# Patient Record
Sex: Female | Born: 1940 | Race: White | Hispanic: No | Smoking: Never smoker
Health system: Southern US, Community
[De-identification: ages and names within clinical notes are randomized; demographics above are authoritative.]

## PROBLEM LIST (undated history)

## (undated) DIAGNOSIS — F0280 Dementia in other diseases classified elsewhere without behavioral disturbance: Secondary | ICD-10-CM

## (undated) DIAGNOSIS — F32A Depression, unspecified: Secondary | ICD-10-CM

## (undated) DIAGNOSIS — F329 Major depressive disorder, single episode, unspecified: Secondary | ICD-10-CM

## (undated) DIAGNOSIS — I10 Essential (primary) hypertension: Secondary | ICD-10-CM

## (undated) HISTORY — PX: BREAST ENHANCEMENT SURGERY: SHX7

## (undated) HISTORY — PX: TUBAL LIGATION: SHX77

## (undated) HISTORY — DX: Depression, unspecified: F32.A

## (undated) HISTORY — PX: ANKLE SURGERY: SHX546

## (undated) HISTORY — DX: Dementia in other diseases classified elsewhere, unspecified severity, without behavioral disturbance, psychotic disturbance, mood disturbance, and anxiety: F02.80

## (undated) HISTORY — DX: Major depressive disorder, single episode, unspecified: F32.9

## (undated) HISTORY — PX: TONSILLECTOMY AND ADENOIDECTOMY: SUR1326

---

## 1982-08-22 HISTORY — PX: BREAST BIOPSY: SHX20

## 2004-08-22 HISTORY — PX: AUGMENTATION MAMMAPLASTY: SUR837

## 2005-07-26 DIAGNOSIS — F39 Unspecified mood [affective] disorder: Secondary | ICD-10-CM | POA: Insufficient documentation

## 2005-07-26 DIAGNOSIS — I10 Essential (primary) hypertension: Secondary | ICD-10-CM | POA: Insufficient documentation

## 2009-08-22 HISTORY — PX: HEMORRHOID SURGERY: SHX153

## 2010-12-23 ENCOUNTER — Ambulatory Visit: Payer: Self-pay | Admitting: Family Medicine

## 2011-03-07 ENCOUNTER — Ambulatory Visit: Payer: Self-pay | Admitting: Family Medicine

## 2011-09-30 DIAGNOSIS — I341 Nonrheumatic mitral (valve) prolapse: Secondary | ICD-10-CM | POA: Insufficient documentation

## 2011-09-30 DIAGNOSIS — F329 Major depressive disorder, single episode, unspecified: Secondary | ICD-10-CM | POA: Insufficient documentation

## 2012-06-13 ENCOUNTER — Ambulatory Visit: Payer: Self-pay | Admitting: Family Medicine

## 2013-02-15 ENCOUNTER — Ambulatory Visit: Payer: Self-pay | Admitting: Unknown Physician Specialty

## 2013-07-03 DIAGNOSIS — M75100 Unspecified rotator cuff tear or rupture of unspecified shoulder, not specified as traumatic: Secondary | ICD-10-CM | POA: Insufficient documentation

## 2013-07-08 ENCOUNTER — Ambulatory Visit: Payer: Self-pay | Admitting: Family Medicine

## 2014-04-30 ENCOUNTER — Ambulatory Visit: Payer: Self-pay | Admitting: Family Medicine

## 2014-11-13 DIAGNOSIS — I341 Nonrheumatic mitral (valve) prolapse: Secondary | ICD-10-CM | POA: Diagnosis not present

## 2014-11-13 DIAGNOSIS — R55 Syncope and collapse: Secondary | ICD-10-CM | POA: Diagnosis not present

## 2014-11-13 DIAGNOSIS — R002 Palpitations: Secondary | ICD-10-CM | POA: Diagnosis not present

## 2014-11-13 DIAGNOSIS — I351 Nonrheumatic aortic (valve) insufficiency: Secondary | ICD-10-CM | POA: Diagnosis not present

## 2015-03-19 ENCOUNTER — Ambulatory Visit: Payer: Self-pay | Admitting: Family Medicine

## 2015-05-12 ENCOUNTER — Encounter: Payer: Self-pay | Admitting: Family Medicine

## 2015-05-28 ENCOUNTER — Encounter: Payer: Self-pay | Admitting: Family Medicine

## 2015-05-28 ENCOUNTER — Ambulatory Visit (INDEPENDENT_AMBULATORY_CARE_PROVIDER_SITE_OTHER): Payer: Medicare Other | Admitting: Family Medicine

## 2015-05-28 VITALS — BP 120/68 | HR 58 | Temp 97.8°F | Resp 16 | Ht 63.0 in | Wt 150.0 lb

## 2015-05-28 DIAGNOSIS — D709 Neutropenia, unspecified: Secondary | ICD-10-CM | POA: Insufficient documentation

## 2015-05-28 DIAGNOSIS — F4321 Adjustment disorder with depressed mood: Secondary | ICD-10-CM | POA: Diagnosis not present

## 2015-05-28 DIAGNOSIS — E785 Hyperlipidemia, unspecified: Secondary | ICD-10-CM | POA: Diagnosis not present

## 2015-05-28 DIAGNOSIS — R197 Diarrhea, unspecified: Secondary | ICD-10-CM | POA: Insufficient documentation

## 2015-05-28 DIAGNOSIS — I10 Essential (primary) hypertension: Secondary | ICD-10-CM

## 2015-05-28 DIAGNOSIS — G2581 Restless legs syndrome: Secondary | ICD-10-CM | POA: Insufficient documentation

## 2015-05-28 DIAGNOSIS — M810 Age-related osteoporosis without current pathological fracture: Secondary | ICD-10-CM | POA: Insufficient documentation

## 2015-05-28 DIAGNOSIS — Z1239 Encounter for other screening for malignant neoplasm of breast: Secondary | ICD-10-CM | POA: Diagnosis not present

## 2015-05-28 DIAGNOSIS — G4700 Insomnia, unspecified: Secondary | ICD-10-CM | POA: Insufficient documentation

## 2015-05-28 DIAGNOSIS — I351 Nonrheumatic aortic (valve) insufficiency: Secondary | ICD-10-CM | POA: Insufficient documentation

## 2015-05-28 DIAGNOSIS — Z0000 Encounter for general adult medical examination without abnormal findings: Secondary | ICD-10-CM

## 2015-05-28 DIAGNOSIS — M1990 Unspecified osteoarthritis, unspecified site: Secondary | ICD-10-CM | POA: Insufficient documentation

## 2015-05-28 MED ORDER — ALPRAZOLAM 0.5 MG PO TABS: 0.5000 mg | ORAL_TABLET | Freq: Every evening | ORAL | Status: DC | PRN

## 2015-05-28 NOTE — Patient Instructions (Signed)
Patient advised to continue weight bearing exercise. Patient advised to check bone density in 2 years.   Please call the [place] at [facility] to schedule this at [phone].  I recommend that you get a flu vaccine this year. Please call our office at [phone] at your earliest convenience to schedule a flu shot.

## 2015-05-28 NOTE — Progress Notes (Signed)
Patient ID: Elaine [name], female   DOB: 11/30/1940, 74 y.o.   MRN: [id number]        Patient: Elaine [name], Female    DOB: [birth year], 74 y.o.   MRN: [id number] Visit Date: 05/28/2015  Today's Provider: Margarita Rana, MD   Chief Complaint  Patient presents with  . Medicare Wellness   Subjective:    Annual wellness visit  Elaine [name] is a 74 y.o. female. She feels well. She reports exercising 3 times a week. She reports she is sleeping fairly well.  04/16/14 CPE 10/09/09 Pap-neg; HPV-Neg 07/08/13 Mammogram-BI-RADS 1 02/15/13 Colonoscopy-Diverticulosis, int hemorrhoids 04/30/14 BMD-Osteoporosis  -----------------------------------------------------------   Review of Systems  Constitutional: Negative.   HENT: Positive for hearing loss and tinnitus.   Eyes: Negative.   Respiratory: Negative.   Cardiovascular: Negative.   Gastrointestinal: Positive for diarrhea.  Endocrine: Negative.   Genitourinary: Negative.   Musculoskeletal: Positive for myalgias and arthralgias.  Skin: Negative.   Allergic/Immunologic: Negative.   Neurological: Negative.   Hematological: Negative.   Psychiatric/Behavioral: Negative.     Social History   Social History  . Marital Status: Widowed    Spouse Name: N/A  . Number of Children: 2  . Years of Education: H/S   Occupational History  . Retired     Psychologist, educational, Retail buyer   Social History Main Topics  . Smoking status: Never Smoker   . Smokeless tobacco: Never Used  . Alcohol Use: 0.6 oz/week    1 Shots of liquor per week     Comment: Pt has two drinks a day on average.  . Drug Use: No  . Sexual Activity: Not on file   Other Topics Concern  . Not on file   Social History Narrative    Patient Active Problem List   Diagnosis Date Noted  . 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 Cannot sleep 05/28/2015  . Neutropenia ([place]) 05/28/2015  . Arthritis, degenerative 05/28/2015  . Osteoporosis,  post-menopausal 05/28/2015  . 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 Restless leg 05/28/2015  . Heart valve disease 05/28/2015  . Rotator cuff syndrome 07/03/2013  . 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 Acid reflux 06/08/2007  . HLD (hyperlipidemia) 01/12/2007  . Adjustment disorder with depressed mood 07/26/2005  . BP (high blood pressure) 07/26/2005  . 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 Tubal ligation    . Breast biopsy    . Hemorrhoid surgery  2011    Hemorrhoidectomy  . Ankle surgery    . Breast enhancement surgery Bilateral   . Tonsillectomy and adenoidectomy      Her family history includes Breast cancer in her maternal aunt; Heart attack in her father; Hypertension in her sister.    Previous Medications   ENALAPRIL (VASOTEC) 5 MG TABLET    Take 5 mg by mouth daily.    IBUPROFEN 200 MG CAPS    Take 1 capsule by mouth as needed.    SERTRALINE (ZOLOFT) 50 MG TABLET    Take 50 mg by mouth daily.     Patient Care Team: Margarita Rana, MD as PCP - General (Family Medicine)     Objective:   Vitals: BP 120/68 mmHg  Pulse 58  Temp(Src) 97.8 F (36.6 C) (Oral)  Resp 16  Ht 5\' 3"  (1.6 m)  Wt 150 lb (68.04 kg)  BMI 26.58 kg/m2  SpO2 97%  Physical Exam  Constitutional: She is oriented to person, place, and time. She appears well-developed and well-nourished.  HENT:  Head: Normocephalic and atraumatic.  Right Ear: Tympanic membrane, external ear and ear canal normal.  Left Ear: Tympanic membrane, external ear and ear canal normal.  Nose: Nose normal.  Mouth/Throat: Uvula is midline, oropharynx is clear and moist and mucous membranes are normal.  Eyes: Conjunctivae, EOM and lids are normal. Pupils are equal, round, and reactive to light.  Neck: Trachea normal and normal range of motion. Neck supple. Carotid bruit is not present. No thyroid mass and no thyromegaly present.    Cardiovascular: Normal rate and regular rhythm.   Murmur heard. Pulmonary/Chest: Effort normal and breath sounds normal.  Abdominal: Soft. Normal appearance and bowel sounds are normal. There is no hepatosplenomegaly. There is no tenderness.  Genitourinary: No breast swelling, tenderness or discharge.  Musculoskeletal: Normal range of motion.  Lymphadenopathy:    She has no cervical adenopathy.    She has no axillary adenopathy.  Neurological: She is alert and oriented to person, place, and time. She has normal strength. No cranial nerve deficit.  Skin: Skin is warm, dry and intact.  Psychiatric: She has a normal mood and affect. Her speech is normal and behavior is normal. Judgment and thought content normal. Cognition and memory are normal.    Activities of Daily Living In your present state of health, do you have any difficulty performing the following activities: 05/28/2015  Hearing? Y  Vision? Y  Difficulty concentrating or making decisions? Y  Walking or climbing stairs? N  Dressing or bathing? N  Doing errands, shopping? N    Fall Risk Assessment Fall Risk  05/28/2015  Falls in the past year? No     Depression Screen PHQ 2/9 Scores 05/28/2015  PHQ - 2 Score 0    Cognitive Testing - 6-CIT  Correct? Score   What year is it? yes 0 0 or 4  What month is it? yes 0 0 or 3  Memorize:    Elaine [name],  41,  [address]  [place],      What time is it? (within 1 hour) yes 0 0 or 3  Count backwards from 20 yes 0 0, 2, or 4  Name the months of the year yes 0 0, 2, or 4  Repeat name & address above yes 0 0, 2, 4, 6, 8, or 10       TOTAL SCORE  0/28   Interpretation:  Normal  Normal (0-7) Abnormal (8-28)       Assessment & Plan:     Annual Wellness Visit  Reviewed patient's Family Medical History Reviewed and updated list of patient's medical providers Assessment of cognitive impairment was done Assessed patient's functional ability Established a written schedule  for health screening [place] Completed and Reviewed  Exercise Activities and Dietary recommendations Goals    . Exercise 150 minutes per week (moderate activity)       Immunization History  Administered Date(s) Administered  . Pneumococcal Conjugate-13 05/28/2014  . Zoster 11/03/2010    Health Maintenance  Topic Date Due  . Samul Dada  03/08/1959  . COLONOSCOPY  03/07/1990  . ZOSTAVAX  03/07/2000  . DEXA SCAN  03/07/2005  . PNA vac Low Risk Adult (1 of 2 - PCV13) 03/07/2005  . INFLUENZA VACCINE  03/23/2015      1. Medicare annual wellness visit, subsequent Continue to eat healthy and exercise. Recheck bone density next year. Decline medication  at this time.    2. Breast cancer screening Will call for appointment.  - MM DIGITAL SCREENING BILATERAL; Future  3. Essential hypertension Stable.  - Comprehensive metabolic panel - TSH  4. HLD (hyperlipidemia) Check  Labs.  - CBC with Differential/Platelet - Lipid Panel With LDL/HDL Ratio  5. Adjustment disorder with depressed mood Refill to use as needed.  - ALPRAZolam (XANAX) 0.5 MG tablet; Take 1 tablet (0.5 mg total) by mouth at bedtime as needed for anxiety.  Dispense: 30 tablet; Refill: 3     Patient was seen and examined by Jerrell Belfast, MD, and note scribed by Lynford Humphrey, [place].   I have reviewed the document for accuracy and completeness and I agree with above. Jerrell Belfast, MD   Margarita Rana, MD  ------------------------------------------------------------------------------------------------------------

## 2015-06-05 ENCOUNTER — Telehealth: Payer: Self-pay

## 2015-06-05 LAB — CBC WITH DIFFERENTIAL/PLATELET
BASOS ABS: 0 10*3/uL (ref 0.0–0.2)
BASOS: 0 %
EOS (ABSOLUTE): 0.1 10*3/uL (ref 0.0–0.4)
Eos: 2 %
HEMOGLOBIN: 13.8 g/dL (ref 11.1–15.9)
Hematocrit: 41.5 % (ref 34.0–46.6)
IMMATURE GRANS (ABS): 0 10*3/uL (ref 0.0–0.1)
Immature Granulocytes: 0 %
LYMPHS: 24 %
Lymphocytes Absolute: 1.1 10*3/uL (ref 0.7–3.1)
MCH: 31.4 pg (ref 26.6–33.0)
MCHC: 33.3 g/dL (ref 31.5–35.7)
MCV: 95 fL (ref 79–97)
MONOCYTES: 6 %
Monocytes Absolute: 0.3 10*3/uL (ref 0.1–0.9)
NEUTROS ABS: 3 10*3/uL (ref 1.4–7.0)
Neutrophils: 68 %
Platelets: 159 10*3/uL (ref 150–379)
RBC: 4.39 x10E6/uL (ref 3.77–5.28)
RDW: 13.3 % (ref 12.3–15.4)
WBC: 4.5 10*3/uL (ref 3.4–10.8)

## 2015-06-05 LAB — COMPREHENSIVE METABOLIC PANEL
ALBUMIN: 4.4 g/dL (ref 3.5–4.8)
ALT: 13 IU/L (ref 0–32)
AST: 21 IU/L (ref 0–40)
Albumin/Globulin Ratio: 2 (ref 1.1–2.5)
Alkaline Phosphatase: 84 IU/L (ref 39–117)
BILIRUBIN TOTAL: 0.6 mg/dL (ref 0.0–1.2)
BUN / CREAT RATIO: 21 (ref 11–26)
BUN: 15 mg/dL (ref 8–27)
CHLORIDE: 104 mmol/L (ref 97–108)
CO2: 24 mmol/L (ref 18–29)
CREATININE: 0.72 mg/dL (ref 0.57–1.00)
Calcium: 9.1 mg/dL (ref 8.7–10.3)
GFR, EST AFRICAN AMERICAN: 95 mL/min/{1.73_m2} (ref >59)
GFR, EST NON AFRICAN AMERICAN: 82 mL/min/{1.73_m2} (ref >59)
GLUCOSE: 104 mg/dL — AB (ref 65–99)
Globulin, Total: 2.2 g/dL (ref 1.5–4.5)
Potassium: 4.3 mmol/L (ref 3.5–5.2)
Sodium: 140 mmol/L (ref 134–144)
TOTAL PROTEIN: 6.6 g/dL (ref 6.0–8.5)

## 2015-06-05 LAB — LIPID PANEL WITH LDL/HDL RATIO
Cholesterol, Total: 191 mg/dL (ref 100–199)
HDL: 38 mg/dL — ABNORMAL LOW (ref >39)
LDL CALC: 141 mg/dL — AB (ref 0–99)
LDL/HDL RATIO: 3.7 ratio — AB (ref 0.0–3.2)
Triglycerides: 59 mg/dL (ref 0–149)
VLDL CHOLESTEROL CAL: 12 mg/dL (ref 5–40)

## 2015-06-05 LAB — TSH: TSH: 0.518 u[IU]/mL (ref 0.450–4.500)

## 2015-06-05 NOTE — Telephone Encounter (Signed)
Pt advised;  She does not want to start a cholesterol medicine at this time.    Thanks,   -Mickel Baas

## 2015-06-05 NOTE — Telephone Encounter (Signed)
-----   Message from Margarita Rana, MD sent at 06/05/2015  1:54 PM EDT ----- Labs stable except for mildly elevated blood sugar. Need to recheck in 3 months for stability. Also cholesterol is mildly elevated, with LDL of 141 and HDL slightly low. In spite of normal total cholesterol,  10 year risk of heart disease is 14 percent and statin is recommended. Please see if patient wants to start medication.  Thanks.

## 2015-06-25 ENCOUNTER — Other Ambulatory Visit: Payer: Self-pay | Admitting: Family Medicine

## 2015-06-25 DIAGNOSIS — F4321 Adjustment disorder with depressed mood: Secondary | ICD-10-CM

## 2015-06-25 NOTE — Telephone Encounter (Signed)
Printed, please fax or call in to pharmacy. Thank you.   

## 2015-06-29 ENCOUNTER — Other Ambulatory Visit: Payer: Self-pay | Admitting: Family Medicine

## 2015-06-29 DIAGNOSIS — I10 Essential (primary) hypertension: Secondary | ICD-10-CM

## 2015-08-20 ENCOUNTER — Telehealth: Payer: Self-pay | Admitting: Family Medicine

## 2015-08-20 NOTE — Telephone Encounter (Signed)
Pt is requesting a flu and pneumonia shot.  Can I schedule this appointment?  CB#[phone]/MW

## 2015-08-21 NOTE — Telephone Encounter (Signed)
PVC 13 given on 05/28/14. sd

## 2015-08-28 ENCOUNTER — Ambulatory Visit (INDEPENDENT_AMBULATORY_CARE_PROVIDER_SITE_OTHER): Payer: Medicare Other | Admitting: Family Medicine

## 2015-08-28 DIAGNOSIS — Z23 Encounter for immunization: Secondary | ICD-10-CM

## 2015-08-28 NOTE — Addendum Note (Signed)
Addended by: Silvio Clayman on: 08/28/2015 10:01 AM   Modules accepted: Orders

## 2015-09-21 ENCOUNTER — Ambulatory Visit (INDEPENDENT_AMBULATORY_CARE_PROVIDER_SITE_OTHER): Payer: Medicare Other | Admitting: Family Medicine

## 2015-09-21 ENCOUNTER — Encounter: Payer: Self-pay | Admitting: Family Medicine

## 2015-09-21 VITALS — BP 126/60 | HR 60 | Temp 97.8°F | Resp 16 | Ht 63.0 in | Wt 150.0 lb

## 2015-09-21 DIAGNOSIS — R7309 Other abnormal glucose: Secondary | ICD-10-CM | POA: Diagnosis not present

## 2015-09-21 DIAGNOSIS — G4700 Insomnia, unspecified: Secondary | ICD-10-CM | POA: Diagnosis not present

## 2015-09-21 DIAGNOSIS — E785 Hyperlipidemia, unspecified: Secondary | ICD-10-CM

## 2015-09-21 LAB — POCT GLYCOSYLATED HEMOGLOBIN (HGB A1C)
ESTIMATED AVERAGE GLUCOSE: 103
Hemoglobin A1C: 5.2

## 2015-09-21 NOTE — Progress Notes (Signed)
Patient ID: Elaine [name], female   DOB: Jun 03, 1941, 75 y.o.   MRN: [id number]       Patient: Elaine [name] Female    DOB: September 17, 1940   75 y.o.   MRN: [id number] Visit Date: 09/21/2015  Today's Provider: Margarita Rana, MD   Chief Complaint  Patient presents with  . Hyperglycemia   Subjective:    HPI  Prediabetes, Follow-up:   Lab Results  Component Value Date   HGBA1C 5.2 09/21/2015   GLUCOSE 104* 06/04/2015    Last seen for for this3 months ago.  Management since that visit includes labs checked. Patient advised to work on healthy lifestyle. Current symptoms include none and have been stable.  Weight trend: stable Prior visit with dietician: no Current diet: in general, a "healthy" diet   Current exercise: cardiovascular workout on exercise equipment and weightlifting 3 days a week.  Pertinent Labs:    Component Value Date/Time   CHOL 191 06/04/2015 1033   TRIG 59 06/04/2015 1033   CREATININE 0.72 06/04/2015 1033    Wt Readings from Last 3 Encounters:  09/21/15 150 lb (68.04 kg)  05/28/15 150 lb (68.04 kg)  05/28/14 145 lb (65.772 kg)         Lipid/Cholesterol, Follow-up:   Last seen for this3 months ago.  Management changes since that visit include check labs. Patient advised to continue working on healthy lifestyle . Last Lipid Panel:    Component Value Date/Time   CHOL 191 06/04/2015 1033   TRIG 59 06/04/2015 1033   HDL 38* 06/04/2015 1033   LDLCALC 141* 06/04/2015 1033    Risk factors for vascular disease include hypertension  Weight trend: stable Prior visit with dietician: no Current diet: in general, a "healthy" diet   Current exercise: cardiovascular workout on exercise equipment and weightlifting  Wt Readings from Last 3 Encounters:  09/21/15 150 lb (68.04 kg)  05/28/15 150 lb (68.04 kg)  05/28/14 145 lb (65.772 kg)   Xanax is really helping her insomnia.   Takes only as needed. Does not need it nightly.     -------------------------------------------------------------------    No Known Allergies Previous Medications   ALPRAZOLAM (XANAX) 0.5 MG TABLET    TAKE HALF TO 1 TABLET EVERY EVENING AS NEEDED   ENALAPRIL (VASOTEC) 5 MG TABLET    Take 1 tablet by mouth  every day   IBUPROFEN 200 MG CAPS    Take 1 capsule by mouth as needed.    SERTRALINE (ZOLOFT) 50 MG TABLET    Take 50 mg by mouth daily. 1/2 tablet daily    Review of Systems  Constitutional: Negative.   Endocrine: Negative.     Social History  Substance Use Topics  . Smoking status: Never Smoker   . Smokeless tobacco: Never Used  . Alcohol Use: 0.6 oz/week    1 Shots of liquor per week     Comment: Pt has two drinks a day on average.   Objective:   BP 126/60 mmHg  Pulse 60  Temp(Src) 97.8 F (36.6 C) (Oral)  Resp 16  Ht 5\' 3"  (1.6 m)  Wt 150 lb (68.04 kg)  BMI 26.58 kg/m2  SpO2 98%  Physical Exam  Constitutional: She is oriented to person, place, and time. She appears well-developed and well-nourished.  Cardiovascular: Normal rate and regular rhythm.   Pulmonary/Chest: Effort normal and breath sounds normal.  Neurological: She is alert and oriented to person, place, and time.  Psychiatric: She has a normal mood and  affect. Her behavior is normal. Judgment and thought content normal.      Assessment & Plan:     1. Abnormal blood sugar Stable. Patient advised to continue working on healthy lifestyle changes. - POCT glycosylated hemoglobin (Hb A1C) Results for orders placed or performed in visit on 09/21/15  POCT glycosylated hemoglobin (Hb A1C)  Result Value Ref Range   Hemoglobin A1C 5.2    Est. average glucose Bld gHb Est-mCnc 103     2. HLD (hyperlipidemia) As above.   3. Insomnia Xanax working well taking it PRN. Will continue.     Patient was seen and examined by Jerrell Belfast, MD, and note scribed by Lynford Humphrey, [place].   I have reviewed the document for accuracy and completeness and I agree  with above. - Jerrell Belfast, MD   Margarita Rana, MD  [facility]

## 2015-09-24 ENCOUNTER — Other Ambulatory Visit: Payer: Self-pay | Admitting: Family Medicine

## 2015-09-24 ENCOUNTER — Ambulatory Visit
Admission: RE | Admit: 2015-09-24 | Discharge: 2015-09-24 | Disposition: A | Discharge status: Home / Self Care | Payer: Medicare Other | Source: Ambulatory Visit | Attending: Family Medicine | Admitting: Family Medicine

## 2015-09-24 DIAGNOSIS — Z1239 Encounter for other screening for malignant neoplasm of breast: Secondary | ICD-10-CM

## 2015-09-24 DIAGNOSIS — Z1231 Encounter for screening mammogram for malignant neoplasm of breast: Secondary | ICD-10-CM | POA: Diagnosis not present

## 2015-11-12 DIAGNOSIS — I351 Nonrheumatic aortic (valve) insufficiency: Secondary | ICD-10-CM | POA: Diagnosis not present

## 2015-11-12 DIAGNOSIS — I341 Nonrheumatic mitral (valve) prolapse: Secondary | ICD-10-CM | POA: Diagnosis not present

## 2015-12-09 ENCOUNTER — Encounter: Payer: Self-pay | Admitting: Family Medicine

## 2015-12-09 ENCOUNTER — Ambulatory Visit (INDEPENDENT_AMBULATORY_CARE_PROVIDER_SITE_OTHER): Payer: Medicare Other | Admitting: Family Medicine

## 2015-12-09 VITALS — BP 124/64 | HR 64 | Temp 98.1°F | Resp 16 | Ht 63.0 in | Wt 150.0 lb

## 2015-12-09 DIAGNOSIS — E785 Hyperlipidemia, unspecified: Secondary | ICD-10-CM | POA: Diagnosis not present

## 2015-12-09 DIAGNOSIS — I10 Essential (primary) hypertension: Secondary | ICD-10-CM

## 2015-12-09 DIAGNOSIS — R7309 Other abnormal glucose: Secondary | ICD-10-CM

## 2015-12-09 NOTE — Progress Notes (Signed)
Subjective:    Patient ID: Elaine [name], female    DOB: Apr 24, 1941, 75 y.o.   MRN: [id number]  HPI  Hyperlipidemia: Patient presents with hyperlipidemia.  She was tested because provider was checking routine labs.  Her last labs on 06/05/2015 showed Total cholesterol of 191, HDL 38, LDL 141,  Triglycerides 59. negative. There is a family history of early ischemia heart disease. Pt reports her cardiologist "strongly recommends" that pt start medication for high LDL.    Review of Systems  Constitutional: Negative for fever, chills, diaphoresis, activity change, appetite change, fatigue and unexpected weight change.  Cardiovascular: Negative for chest pain, palpitations and leg swelling.   BP 124/64 mmHg  Pulse 64  Temp(Src) 98.1 F (36.7 C) (Oral)  Resp 16  Ht 5\' 3"  (1.6 m)  Wt 150 lb (68.04 kg)  BMI 26.58 kg/m2   Patient Active Problem List   Diagnosis Date Noted  . Insomnia 09/21/2015  . 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 Cannot sleep 05/28/2015  . Neutropenia ([place]) 05/28/2015  . Arthritis, degenerative 05/28/2015  . 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 Restless leg 05/28/2015  . Heart valve disease 05/28/2015  . Rotator cuff syndrome 07/03/2013  . 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 Acid reflux 06/08/2007  . HLD (hyperlipidemia) 01/12/2007  . Adjustment disorder with depressed mood 07/26/2005  . BP (high blood pressure) 07/26/2005  . 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 Depression    Current Outpatient Prescriptions on File Prior to Visit  Medication Sig  . ALPRAZolam (XANAX) 0.5 MG tablet TAKE HALF TO 1 TABLET EVERY EVENING AS NEEDED (Patient taking differently: TAKE HALF TO 1/2  TABLET EVERY EVENING AS NEEDED)  . enalapril (VASOTEC) 5 MG tablet Take 1 tablet by mouth  every day  . Ibuprofen 200 MG CAPS Take 1 capsule by  mouth as needed.   . sertraline (ZOLOFT) 50 MG tablet Take 50 mg by mouth daily. 1/2 tablet daily   No current facility-administered medications on file prior to visit.   No Known Allergies Past Surgical History  Procedure Laterality Date  . Tubal ligation    . Hemorrhoid surgery  2011    Hemorrhoidectomy  . Ankle surgery    . Breast enhancement surgery Bilateral   . Tonsillectomy and adenoidectomy    . Breast biopsy Right 1984    EXCISIONAL - NEG  . Augmentation mammaplasty Bilateral 2006   Social History   Social History  . Marital Status: Widowed    Spouse Name: N/A  . Number of Children: 2  . Years of Education: H/S   Occupational History  . Retired     Psychologist, educational, Retail buyer   Social History Main Topics  . Smoking status: Never Smoker   . Smokeless tobacco: Never Used  . Alcohol Use: 0.6 oz/week    1 Shots of liquor per week     Comment: 1 drink daily  . Drug Use: No  . Sexual Activity: Not on file   Other Topics Concern  . Not on file   Social History Narrative   Family History  Problem Relation Age of Onset  . Heart attack Father   . Breast cancer Maternal Aunt   . Hypertension Sister     pulmonary HTN       Objective:   Physical Exam  Constitutional: She appears well-developed and well-nourished.  Psychiatric:  She has a normal mood and affect. Her behavior is normal.  BP 124/64 mmHg  Pulse 64  Temp(Src) 98.1 F (36.7 C) (Oral)  Resp 16  Ht 5\' 3"  (1.6 m)  Wt 150 lb (68.04 kg)  BMI 26.58 kg/m2     Assessment & Plan:  1. HLD (hyperlipidemia) Will recheck labs and recheck risk of heart disease. Will decide about cholesterol medication pending results. - Lipid panel - CBC with Differential/Platelet - Comprehensive metabolic panel  2. Abnormal blood sugar Will recheck A1C as below. - Hemoglobin A1c  3. Essential hypertension Resolved. Not a true diagnosis. Pt is taking Vasotec for billowing mitral valve. Removed from  diagnosis list today.  Patient seen and examined by Jerrell Belfast, MD, and note scribed by Renaldo Fiddler, CMA.   I have reviewed the document for accuracy and completeness and I agree with above. Jerrell Belfast, MD  Margarita Rana, MD

## 2015-12-11 DIAGNOSIS — R7309 Other abnormal glucose: Secondary | ICD-10-CM | POA: Diagnosis not present

## 2015-12-11 DIAGNOSIS — E785 Hyperlipidemia, unspecified: Secondary | ICD-10-CM | POA: Diagnosis not present

## 2015-12-12 LAB — CBC WITH DIFFERENTIAL/PLATELET
BASOS ABS: 0 10*3/uL (ref 0.0–0.2)
BASOS: 0 %
EOS (ABSOLUTE): 0.1 10*3/uL (ref 0.0–0.4)
Eos: 2 %
Hematocrit: 38.3 % (ref 34.0–46.6)
Hemoglobin: 12.8 g/dL (ref 11.1–15.9)
IMMATURE GRANS (ABS): 0 10*3/uL (ref 0.0–0.1)
Immature Granulocytes: 0 %
LYMPHS ABS: 0.8 10*3/uL (ref 0.7–3.1)
LYMPHS: 25 %
MCH: 31.1 pg (ref 26.6–33.0)
MCHC: 33.4 g/dL (ref 31.5–35.7)
MCV: 93 fL (ref 79–97)
Monocytes Absolute: 0.2 10*3/uL (ref 0.1–0.9)
Monocytes: 7 %
NEUTROS ABS: 2.1 10*3/uL (ref 1.4–7.0)
Neutrophils: 66 %
PLATELETS: 161 10*3/uL (ref 150–379)
RBC: 4.11 x10E6/uL (ref 3.77–5.28)
RDW: 13.6 % (ref 12.3–15.4)
WBC: 3.1 10*3/uL — ABNORMAL LOW (ref 3.4–10.8)

## 2015-12-12 LAB — LIPID PANEL
CHOL/HDL RATIO: 5.1 ratio — AB (ref 0.0–4.4)
CHOLESTEROL TOTAL: 182 mg/dL (ref 100–199)
HDL: 36 mg/dL — AB (ref >39)
LDL CALC: 130 mg/dL — AB (ref 0–99)
TRIGLYCERIDES: 80 mg/dL (ref 0–149)
VLDL CHOLESTEROL CAL: 16 mg/dL (ref 5–40)

## 2015-12-12 LAB — COMPREHENSIVE METABOLIC PANEL
A/G RATIO: 1.8 (ref 1.2–2.2)
ALBUMIN: 4.1 g/dL (ref 3.5–4.8)
ALK PHOS: 80 IU/L (ref 39–117)
ALT: 12 IU/L (ref 0–32)
AST: 22 IU/L (ref 0–40)
BILIRUBIN TOTAL: 0.5 mg/dL (ref 0.0–1.2)
BUN / CREAT RATIO: 18 (ref 12–28)
BUN: 14 mg/dL (ref 8–27)
CHLORIDE: 101 mmol/L (ref 96–106)
CO2: 22 mmol/L (ref 18–29)
Calcium: 9.2 mg/dL (ref 8.7–10.3)
Creatinine, Ser: 0.77 mg/dL (ref 0.57–1.00)
GFR calc non Af Amer: 76 mL/min/{1.73_m2} (ref >59)
GFR, EST AFRICAN AMERICAN: 87 mL/min/{1.73_m2} (ref >59)
GLUCOSE: 87 mg/dL (ref 65–99)
Globulin, Total: 2.3 g/dL (ref 1.5–4.5)
POTASSIUM: 4.2 mmol/L (ref 3.5–5.2)
Sodium: 139 mmol/L (ref 134–144)
TOTAL PROTEIN: 6.4 g/dL (ref 6.0–8.5)

## 2015-12-12 LAB — HEMOGLOBIN A1C
Est. average glucose Bld gHb Est-mCnc: 105 mg/dL
HEMOGLOBIN A1C: 5.3 % (ref 4.8–5.6)

## 2015-12-14 ENCOUNTER — Telehealth: Payer: Self-pay

## 2015-12-14 NOTE — Telephone Encounter (Signed)
-----   Message from Margarita Rana, MD sent at 12/12/2015  9:23 AM EDT ----- Labs stable except for low white count. Recheck CBC in 4 weeks. Thanks.

## 2015-12-14 NOTE — Telephone Encounter (Signed)
Advised pt of lab results. Pt verbally acknowledges understanding. Willadean Guyton Drozdowski, CMA   

## 2016-01-22 ENCOUNTER — Other Ambulatory Visit: Payer: Self-pay | Admitting: Family Medicine

## 2016-01-22 ENCOUNTER — Encounter: Payer: Self-pay | Admitting: Family Medicine

## 2016-01-22 ENCOUNTER — Ambulatory Visit (INDEPENDENT_AMBULATORY_CARE_PROVIDER_SITE_OTHER): Payer: Medicare Other | Admitting: Family Medicine

## 2016-01-22 VITALS — BP 130/62 | HR 64 | Temp 97.7°F | Resp 16 | Wt 148.0 lb

## 2016-01-22 DIAGNOSIS — E785 Hyperlipidemia, unspecified: Secondary | ICD-10-CM

## 2016-01-22 DIAGNOSIS — E7800 Pure hypercholesterolemia, unspecified: Secondary | ICD-10-CM

## 2016-01-22 DIAGNOSIS — G4700 Insomnia, unspecified: Secondary | ICD-10-CM | POA: Diagnosis not present

## 2016-01-22 DIAGNOSIS — D709 Neutropenia, unspecified: Secondary | ICD-10-CM | POA: Diagnosis not present

## 2016-01-22 MED ORDER — ASPIRIN 81 MG PO TABS: 81.0000 mg | ORAL_TABLET | Freq: Every day | ORAL | Status: DC

## 2016-01-22 MED ORDER — ALPRAZOLAM 0.5 MG PO TABS: 0.2500 mg | ORAL_TABLET | Freq: Every evening | ORAL | Status: DC | PRN

## 2016-01-22 NOTE — Progress Notes (Signed)
Subjective:    Patient ID: Elaine [name], female    DOB: 08-Apr-1941, 75 y.o.   MRN: [id number]  HPI  Abnormal Lab Pt had labs checked on 12/11/2015. Her WBC was 3.1. Pt needs CBC rechecked.    Follow up for Insomnia  The patient was last seen for this 5 months ago. Changes made at last visit include continuing Alprazolam PRN.  She reports excellent compliance with treatment. She feels that condition is Unchanged. She is having side effects. States she is "hungover" the morning after taking Alprazolam. Pt does admit she tends to take 0.25 mg of medication too late.   Also concerned about her cholesterol.   She does not want to start a medication.   Cardiologist does want her to start one.  Her levels are not that high. Will recheck more sensitive studies.    ------------------------------------------------------------------------------------     Review of Systems  Constitutional: Positive for fatigue. Negative for fever, chills, diaphoresis, activity change, appetite change and unexpected weight change.  Respiratory: Negative for cough, shortness of breath and wheezing.   Cardiovascular: Negative for chest pain, palpitations and leg swelling.  Psychiatric/Behavioral: Negative for sleep disturbance.   BP 130/62 mmHg  Pulse 64  Temp(Src) 97.7 F (36.5 C) (Oral)  Resp 16  Wt 148 lb (67.132 kg)   Patient Active Problem List   Diagnosis Date Noted  . Abnormal blood sugar 12/09/2015  . Insomnia 09/21/2015  . 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 Cannot sleep 05/28/2015  . Neutropenia ([place]) 05/28/2015  . Arthritis, degenerative 05/28/2015  . 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 Restless leg 05/28/2015  . Heart valve disease 05/28/2015  . Rotator cuff syndrome 07/03/2013  . 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 Acid reflux 06/08/2007  . HLD (hyperlipidemia) 01/12/2007  .  Adjustment disorder with depressed mood 07/26/2005  . 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 Depression    Current Outpatient Prescriptions on File Prior to Visit  Medication Sig  . ALPRAZolam (XANAX) 0.5 MG tablet TAKE HALF TO 1 TABLET EVERY EVENING AS NEEDED (Patient taking differently: TAKE HALF TO 1/2  TABLET EVERY EVENING AS NEEDED)  . enalapril (VASOTEC) 5 MG tablet Take 1 tablet by mouth  every day  . Ibuprofen 200 MG CAPS Take 1 capsule by mouth as needed.   . sertraline (ZOLOFT) 50 MG tablet Take 50 mg by mouth daily. 1/2 tablet daily   No current facility-administered medications on file prior to visit.   No Known Allergies Past Surgical History  Procedure Laterality Date  . Tubal ligation    . Hemorrhoid surgery  2011    Hemorrhoidectomy  . Ankle surgery    . Breast enhancement surgery Bilateral   . Tonsillectomy and adenoidectomy    . Breast biopsy Right 1984    EXCISIONAL - NEG  . Augmentation mammaplasty Bilateral 2006   Social History   Social History  . Marital Status: Widowed    Spouse Name: N/A  . Number of Children: 2  . Years of Education: H/S   Occupational History  . Retired     Psychologist, educational, Retail buyer   Social History Main Topics  . Smoking status: Never Smoker   . Smokeless tobacco: Never Used  . Alcohol Use: 0.6 oz/week    1 Shots of liquor per week     Comment: 1 drink daily  . Drug  Use: No  . Sexual Activity: Not on file   Other Topics Concern  . Not on file   Social History Narrative   Family History  Problem Relation Age of Onset  . Heart attack Father   . Breast cancer Maternal Aunt   . Hypertension Sister     pulmonary HTN       Objective:   Physical Exam  Constitutional: She appears well-developed and well-nourished.  Cardiovascular: Normal rate and regular rhythm.   Pulmonary/Chest: Effort normal and breath sounds normal. No respiratory distress.  Psychiatric:  She has a normal mood and affect. Her behavior is normal.      Assessment & Plan:  1. Neutropenia, unspecified type ([place]) Will recheck labs as below. - CBC with Differential/Platelet  2. Insomnia Stable. Refill as below. - ALPRAZolam (XANAX) 0.5 MG tablet; Take 0.5-1 tablets (0.25-0.5 mg total) by mouth at bedtime as needed for anxiety. TAKE 1/2 TO 1  TABLET EVERY EVENING AS NEEDED  Dispense: 30 tablet; Refill: 5  3. Hypercholesteremia See plan below.  4. HLD (hyperlipidemia) Pt concerned because of recent lipid results. Will order labs as below. FU pending results. Cardiologist really wants her to start medication and she is reluctant.    - Lipid Cascade - High sensitivity CRP - aspirin 81 MG tablet; Take 1 tablet (81 mg total) by mouth daily.  Dispense: 1 tablet; Refill: 0    Patient seen and examined by Jerrell Belfast, MD, and note scribed by Renaldo Fiddler, CMA.   I have reviewed the document for accuracy and completeness and I agree with above. Jerrell Belfast, MD   Margarita Rana, MD

## 2016-01-23 LAB — CBC WITH DIFFERENTIAL/PLATELET
BASOS ABS: 0 10*3/uL (ref 0.0–0.2)
Basos: 1 %
EOS (ABSOLUTE): 0.1 10*3/uL (ref 0.0–0.4)
Eos: 1 %
Hematocrit: 40.6 % (ref 34.0–46.6)
Hemoglobin: 13.5 g/dL (ref 11.1–15.9)
Immature Grans (Abs): 0 10*3/uL (ref 0.0–0.1)
Immature Granulocytes: 0 %
LYMPHS ABS: 1.2 10*3/uL (ref 0.7–3.1)
Lymphs: 28 %
MCH: 31.3 pg (ref 26.6–33.0)
MCHC: 33.3 g/dL (ref 31.5–35.7)
MCV: 94 fL (ref 79–97)
MONOS ABS: 0.3 10*3/uL (ref 0.1–0.9)
Monocytes: 6 %
NEUTROS ABS: 2.7 10*3/uL (ref 1.4–7.0)
Neutrophils: 64 %
PLATELETS: 175 10*3/uL (ref 150–379)
RBC: 4.32 x10E6/uL (ref 3.77–5.28)
RDW: 13.4 % (ref 12.3–15.4)
WBC: 4.3 10*3/uL (ref 3.4–10.8)

## 2016-01-23 LAB — LIPID CASCADE
CHOLESTEROL TOTAL: 181 mg/dL (ref 100–199)
HDL: 39 mg/dL — ABNORMAL LOW (ref >39)
LDL CALC: 127 mg/dL — AB (ref 0–99)
LDL/HDL RATIO: 3.3 ratio — AB (ref 0.0–3.2)
TRIGLYCERIDES: 74 mg/dL (ref 0–149)
Total Non-HDL-Chol (LDL+VLDL): 142 mg/dL — ABNORMAL HIGH (ref 0–129)

## 2016-01-23 LAB — HIGH SENSITIVITY CRP: CRP HIGH SENSITIVITY: 0.95 mg/L (ref 0.00–3.00)

## 2016-01-23 LAB — LIPOPROTEIN ANALYSIS, BY NMR
HDL PARTICLE NUMBER: 29.8 umol/L — AB (ref >=30.5)
LDL PARTICLE NUMBER: 1593 nmol/L — AB (ref <1000)
LDL Size: 20.9 nm (ref >20.5)
LP-IR Score: 69 — ABNORMAL HIGH (ref <=45)
SMALL LDL PARTICLE NUMBER: 469 nmol/L (ref <=527)

## 2016-01-25 ENCOUNTER — Telehealth: Payer: Self-pay

## 2016-01-25 DIAGNOSIS — E7800 Pure hypercholesterolemia, unspecified: Secondary | ICD-10-CM

## 2016-01-25 MED ORDER — ATORVASTATIN CALCIUM 10 MG PO TABS: 10.0000 mg | ORAL_TABLET | Freq: Every day | ORAL | Status: DC

## 2016-01-25 NOTE — Telephone Encounter (Signed)
Sent in rx.   Make sure patient stops if any side effects like muscle pain or cramps.  Also have patient make follow up ov with Dr. Caryn Section in 6 to 8 weeks to recheck labs on statin. Thanks

## 2016-01-25 NOTE — Telephone Encounter (Signed)
Pt agrees to start medication. [facility] Balding, CMA

## 2016-01-25 NOTE — Telephone Encounter (Signed)
No answer on either home phone or cell phone. Unable to leave message. Will try again later. Renaldo Fiddler, CMA

## 2016-01-25 NOTE — Telephone Encounter (Signed)
-----   Message from Margarita Rana, MD sent at 01/24/2016 12:30 PM EDT ----- Cholesterol with low HDL and high bad cholesterol . Extra test show high bad cholesterol, low HDL also but low inflammatory marker. 10 year risk of heart disease is 16 percent.  Do recommend starting a  Low dose statin.  If patient still reluctant to start mediation, make sure to eat healthy and exercise.   Thanks.

## 2016-02-19 ENCOUNTER — Ambulatory Visit: Payer: Medicare Other

## 2016-03-01 ENCOUNTER — Other Ambulatory Visit: Payer: Self-pay | Admitting: Family Medicine

## 2016-04-27 ENCOUNTER — Telehealth: Payer: Self-pay | Admitting: Family Medicine

## 2016-04-27 DIAGNOSIS — E7800 Pure hypercholesterolemia, unspecified: Secondary | ICD-10-CM

## 2016-04-27 DIAGNOSIS — G4700 Insomnia, unspecified: Secondary | ICD-10-CM

## 2016-04-27 NOTE — Telephone Encounter (Signed)
Patient called needing refill on her generic xanax .5 and her atorvastaTIN 10MG . SHE WANTS THESE TO GO TO HER MAIL ORDER  [facility].  Pt's call back is [phone]  Thanks Con Memos

## 2016-04-27 NOTE — Telephone Encounter (Signed)
Has appointment with you 08/02/2016. Renaldo Fiddler, CMA

## 2016-04-28 MED ORDER — ATORVASTATIN CALCIUM 10 MG PO TABS: 10.0000 mg | ORAL_TABLET | Freq: Every day | ORAL | 3 refills | Status: DC

## 2016-04-28 MED ORDER — ALPRAZOLAM 0.5 MG PO TABS: 0.2500 mg | ORAL_TABLET | Freq: Every evening | ORAL | 1 refills | Status: DC | PRN

## 2016-04-28 NOTE — Telephone Encounter (Signed)
prescrpition called into mail order.

## 2016-04-28 NOTE — Telephone Encounter (Signed)
Please call in alprazolam.  

## 2016-06-01 ENCOUNTER — Other Ambulatory Visit: Payer: Self-pay

## 2016-06-01 NOTE — Telephone Encounter (Signed)
Pharmacy requesting refill to mail order.

## 2016-06-02 MED ORDER — SERTRALINE HCL 50 MG PO TABS: 50.0000 mg | ORAL_TABLET | Freq: Every day | ORAL | 2 refills | Status: DC

## 2016-07-04 ENCOUNTER — Telehealth: Payer: Self-pay | Admitting: Family Medicine

## 2016-07-04 NOTE — Telephone Encounter (Signed)
Called Pt to schedule AWV with [facility] on 12/12 before appt with Dr. Caryn Section- knb

## 2016-07-13 ENCOUNTER — Other Ambulatory Visit: Payer: Self-pay | Admitting: Family Medicine

## 2016-08-02 ENCOUNTER — Ambulatory Visit (INDEPENDENT_AMBULATORY_CARE_PROVIDER_SITE_OTHER): Payer: Medicare Other

## 2016-08-02 ENCOUNTER — Ambulatory Visit (INDEPENDENT_AMBULATORY_CARE_PROVIDER_SITE_OTHER): Payer: Medicare Other | Admitting: Family Medicine

## 2016-08-02 VITALS — BP 142/70 | HR 64 | Temp 98.1°F | Ht 62.75 in | Wt 148.0 lb

## 2016-08-02 DIAGNOSIS — F39 Unspecified mood [affective] disorder: Secondary | ICD-10-CM

## 2016-08-02 DIAGNOSIS — R7309 Other abnormal glucose: Secondary | ICD-10-CM

## 2016-08-02 DIAGNOSIS — M810 Age-related osteoporosis without current pathological fracture: Secondary | ICD-10-CM

## 2016-08-02 DIAGNOSIS — E7800 Pure hypercholesterolemia, unspecified: Secondary | ICD-10-CM

## 2016-08-02 DIAGNOSIS — E785 Hyperlipidemia, unspecified: Secondary | ICD-10-CM | POA: Diagnosis not present

## 2016-08-02 DIAGNOSIS — Z0000 Encounter for general adult medical examination without abnormal findings: Secondary | ICD-10-CM | POA: Diagnosis not present

## 2016-08-02 NOTE — Patient Instructions (Signed)
Ms. Arratia , Thank you for taking time to come for your Medicare Wellness Visit. I appreciate your ongoing commitment to your health goals. Please review the following plan we discussed and let me know if I can assist you in the future.   These are the goals we discussed: Goals    . Exercise 150 minutes per week (moderate activity)    . Increase water intake          Starting 08/02/16, I will increase my water intake to 4 glasses every day.       This is a list of the screening recommended for you and due dates:  Health Maintenance  Topic Date Due  . Tetanus Vaccine  03/08/1959  . DEXA scan (bone density measurement)  03/07/2005  . Flu Shot  03/22/2016  . Shingles Vaccine  Completed  . Pneumonia vaccines  Completed   Preventive Care for Adults  A healthy lifestyle and preventive care can promote health and wellness. Preventive health guidelines for adults include the following key practices.  . A routine yearly physical is a good way to check with your health care provider about your health and preventive screening. It is a chance to share any concerns and updates on your health and to receive a thorough exam.  . Visit your dentist for a routine exam and preventive care every 6 months. Brush your teeth twice a day and floss once a day. Good oral hygiene prevents tooth decay and gum disease.  . The frequency of eye exams is based on your age, health, family medical history, use  of contact lenses, and other factors. Follow your health care provider's ecommendations for frequency of eye exams.  . Eat a healthy diet. Foods like vegetables, fruits, whole grains, low-fat dairy products, and lean protein foods contain the nutrients you need without too many calories. Decrease your intake of foods high in solid fats, added sugars, and salt. Eat the right amount of calories for you. Get information about a proper diet from your health care provider, if necessary.  . Regular physical  exercise is one of the most important things you can do for your health. Most adults should get at least 150 minutes of moderate-intensity exercise (any activity that increases your heart rate and causes you to sweat) each week. In addition, most adults need muscle-strengthening exercises on 2 or more days a week.  Silver Sneakers may be a benefit available to you. To determine eligibility, you may visit the website: www.silversneakers.com or contact program at (743)881-5510 Mon-Fri between 8AM-8PM.   . Maintain a healthy weight. The body mass index (BMI) is a screening tool to identify possible weight problems. It provides an estimate of body fat based on height and weight. Your health care provider can find your BMI and can help you achieve or maintain a healthy weight.   For adults 20 years and older: ? A BMI below 18.5 is considered underweight. ? A BMI of 18.5 to 24.9 is normal. ? A BMI of 25 to 29.9 is considered overweight. ? A BMI of 30 and above is considered obese.   . Maintain normal blood lipids and cholesterol levels by exercising and minimizing your intake of saturated fat. Eat a balanced diet with plenty of fruit and vegetables. Blood tests for lipids and cholesterol should begin at age 31 and be repeated every 5 years. If your lipid or cholesterol levels are high, you are over 50, or you are at high risk for heart  disease, you may need your cholesterol levels checked more frequently. Ongoing high lipid and cholesterol levels should be treated with medicines if diet and exercise are not working.  . If you smoke, find out from your health care provider how to quit. If you do not use tobacco, please do not start.  . If you choose to drink alcohol, please do not consume more than 2 drinks per day. One drink is considered to be 12 ounces (355 mL) of beer, 5 ounces (148 mL) of wine, or 1.5 ounces (44 mL) of liquor.  . If you are 76-23 years old, ask your health care provider if you  should take aspirin to prevent strokes.  . Use sunscreen. Apply sunscreen liberally and repeatedly throughout the day. You should seek shade when your shadow is shorter than you. Protect yourself by wearing long sleeves, pants, a wide-brimmed hat, and sunglasses year round, whenever you are outdoors.  . Once a month, do a whole body skin exam, using a mirror to look at the skin on your back. Tell your health care provider of new moles, moles that have irregular borders, moles that are larger than a pencil eraser, or moles that have changed in shape or color.

## 2016-08-02 NOTE — Progress Notes (Signed)
Subjective:   Elaine [name] is a 75 y.o. female who presents for Medicare Annual (Subsequent) preventive examination.  Review of Systems:  N/A  Cardiac Risk Factors include: advanced age (>33men, >82 women);dyslipidemia;hypertension     Objective:     Vitals: BP (!) 142/70 (BP Location: Right Arm)   Pulse 64   Temp 98.1 F (36.7 C) (Oral)   Ht 5' 2.75" (1.594 m)   Wt 148 lb (67.1 kg)   BMI 26.43 kg/m   Body mass index is 26.43 kg/m.   Tobacco History  Smoking Status  . Never Smoker  Smokeless Tobacco  . Never Used     Counseling given: Not Answered   Past Medical History:  Diagnosis Date  . Depression    Past Surgical History:  Procedure Laterality Date  . ANKLE SURGERY    . AUGMENTATION MAMMAPLASTY Bilateral 2006  . BREAST BIOPSY Right 1984   EXCISIONAL - NEG  . BREAST ENHANCEMENT SURGERY Bilateral   . HEMORRHOID SURGERY  2011   Hemorrhoidectomy  . TONSILLECTOMY AND ADENOIDECTOMY    . TUBAL LIGATION     Family History  Problem Relation Age of Onset  . Heart attack Father   . Hypertension Sister     pulmonary HTN  . Breast cancer Maternal Aunt    History  Sexual Activity  . Sexual activity: Not on file    Outpatient Encounter Prescriptions as of 08/02/2016  Medication Sig  . ALPRAZolam (XANAX) 0.5 MG tablet Take 0.5-1 tablets (0.25-0.5 mg total) by mouth at bedtime as needed for anxiety. TAKE 1/2 TO 1  TABLET EVERY EVENING AS NEEDED  . aspirin 81 MG tablet Take 1 tablet (81 mg total) by mouth daily.  Marland Kitchen atorvastatin (LIPITOR) 10 MG tablet Take 1 tablet (10 mg total) by mouth daily.  . enalapril (VASOTEC) 5 MG tablet TAKE 1 TABLET BY MOUTH  EVERY DAY  . Ibuprofen 200 MG CAPS Take 1 capsule by mouth as needed.   . sertraline (ZOLOFT) 50 MG tablet Take 1 tablet (50 mg total) by mouth daily. 1/2 tablet daily   No facility-administered encounter medications on file as of 08/02/2016.     Activities of Daily Living In your present state of  health, do you have any difficulty performing the following activities: 08/02/2016  Hearing? Y  Vision? N  Difficulty concentrating or making decisions? Y  Walking or climbing stairs? N  Dressing or bathing? N  Doing errands, shopping? N  Preparing Food and eating ? N  Using the Toilet? N  In the past six months, have you accidently leaked urine? Y  Do you have problems with loss of bowel control? Y  Managing your Medications? N  Managing your Finances? N  Housekeeping or managing your Housekeeping? N  Some recent data might be hidden    Patient Care Team: Birdie Sons, MD as PCP - General (Family Medicine) Mackie Pai, MD as Referring Physician (Cardiology) Arelia Sneddon, OD as Consulting Physician (Optometry)    Assessment:     Exercise Activities and Dietary recommendations Current Exercise Habits: Structured exercise class, Type of exercise: strength training/weights;walking (eliptical and bike), Time (Minutes): > 60, Frequency (Times/Week): 3, Weekly Exercise (Minutes/Week): 0  Goals    . Exercise 150 minutes per week (moderate activity)    . Increase water intake          Starting 08/02/16, I will increase my water intake to 4 glasses every day.      Fall  Risk Fall Risk  08/02/2016 05/28/2015  Falls in the past year? No No   Depression Screen PHQ 2/9 Scores 08/02/2016 05/28/2015  PHQ - 2 Score 0 0     Cognitive Function     6CIT Screen 08/02/2016  What Year? 0 points  What month? 0 points  What time? 0 points  Count [name] from 20 0 points  Months in reverse 0 points  Repeat phrase 2 points  Total Score 2    Immunization History  Administered Date(s) Administered  . Influenza,inj,Quad PF,36+ Mos 08/28/2015  . Pneumococcal Conjugate-13 05/28/2014  . Pneumococcal Polysaccharide-23 08/28/2015  . Zoster 11/03/2010   Screening Tests Health Maintenance  Topic Date Due  . TETANUS/TDAP  08/02/2017 (Originally 03/08/1960)  . INFLUENZA  VACCINE  Completed  . DEXA SCAN  Completed  . ZOSTAVAX  Completed  . PNA vac Low Risk Adult  Completed      Plan:  I have personally reviewed and addressed the Medicare Annual Wellness questionnaire and have noted the following in the patient's chart:  A. Medical and social history B. Use of alcohol, tobacco or illicit drugs  C. Current medications and supplements D. Functional ability and status E.  Nutritional status F.  Physical activity G. Advance directives H. List of other physicians I.  Hospitalizations, surgeries, and ER visits in previous 12 months J.  [place] such as hearing and vision if needed, cognitive and depression L. Referrals and appointments - none  In addition, I have reviewed and discussed with patient certain preventive protocols, quality metrics, and best practice recommendations. A written personalized care plan for preventive services as well as general preventive health recommendations were provided to patient.  See attached scanned questionnaire for additional information.   Signed,  Fabio Neighbors, LPN Nurse Health Advisor   MD Recommendations: none.  I have reviewed the health advisor's note, was available for consultation, and agree with documentation and plan  Lelon Huh, MD

## 2016-08-02 NOTE — Progress Notes (Signed)
Patient: Elaine [name] Female    DOB: 15-Sep-1940   75 y.o.   MRN: [id number] Visit Date: 08/02/2016  Today's Provider: Lelon Huh, MD   Chief Complaint  Patient presents with  . Follow-up  . Hyperlipidemia  . Insomnia   Subjective:    HPI  This is a previous patient of Dr. Venia Minks present today as new patient to me to establish care and follow up on chronic medical problems.   Abnormal blood sugar From 12/09/2015-checked A1C, no changes. Last A1c was 5.3 on 12/11/2015.  Insomnia From 01/22/2016-Stable. Has stopped taking lorazepam as she has not had much trouble with anxiety or sleeping lately.   Follow up anxiety/depression Feels that sertraline remains very effective for hew and is not aware of any adverse effects.    Lipid/Cholesterol, Follow-up:   Last seen for this 6 months ago.  Management since that visit includes; started atorvastatin 10 mg qd.  Last Lipid Panel:    Component Value Date/Time   CHOL 181 01/22/2016 1047   TRIG 74 01/22/2016 1047   HDL 39 (L) 01/22/2016 1047   CHOLHDL 5.1 (H) 12/11/2015 0905   LDLCALC 127 (H) 01/22/2016 1047    She reports good compliance with treatment. She is not having side effects. none  Wt Readings from Last 3 Encounters:  08/02/16 148 lb (67.1 kg)  01/22/16 148 lb (67.1 kg)  12/09/15 150 lb (68 kg)    ----------------------------------------------------------------     No Known Allergies   Current Outpatient Prescriptions:  .  ALPRAZolam (XANAX) 0.5 MG tablet, Take 0.5-1 tablets (0.25-0.5 mg total) by mouth at bedtime as needed for anxiety. TAKE 1/2 TO 1  TABLET EVERY EVENING AS NEEDED, Disp: 90 tablet, Rfl: 1 .  aspirin 81 MG tablet, Take 1 tablet (81 mg total) by mouth daily., Disp: 1 tablet, Rfl: 0 .  atorvastatin (LIPITOR) 10 MG tablet, Take 1 tablet (10 mg total) by mouth daily., Disp: 90 tablet, Rfl: 3 .  enalapril (VASOTEC) 5 MG tablet, TAKE 1 TABLET BY MOUTH  EVERY DAY, Disp: 90 tablet,  Rfl: 3 .  Ibuprofen 200 MG CAPS, Take 1 capsule by mouth as needed. , Disp: , Rfl:  .  sertraline (ZOLOFT) 50 MG tablet, Take 1 tablet (50 mg total) by mouth daily. 1/2 tablet daily, Disp: 90 tablet, Rfl: 2  Review of Systems  Constitutional: Negative for appetite change, chills, fatigue and fever.  Respiratory: Negative for chest tightness and shortness of breath.   Cardiovascular: Negative for chest pain and palpitations.  Gastrointestinal: Negative for abdominal pain, nausea and vomiting.  Neurological: Negative for dizziness and weakness.    Social History  Substance Use Topics  . Smoking status: Never Smoker  . Smokeless tobacco: Never Used  . Alcohol use 0.6 oz/week    1 Shots of liquor per week     Comment: 1 drink daily   Objective:   Vital Signs - Last Recorded  Most recent update: 08/02/2016 9:02 AM by Fabio Neighbors, LPN  BP    579FGE (BP Location: Right Arm)     Pulse  64     Temp  98.1 F (36.7 C) (Oral)     Ht  5' 2.75" (1.594 m)     Wt  148 lb (67.1 kg)      BMI  26.43 kg/m       Physical Exam   General Appearance:    Alert, cooperative, no distress  Eyes:  PERRL, conjunctiva/corneas clear, EOM's intact       Lungs:     Clear to auscultation bilaterally, respirations unlabored  Heart:    Regular rate and rhythm  Neurologic:   Awake, alert, oriented x 3. No apparent focal neurological           defect.          Assessment & Plan:     1. Osteoporosis, post-menopausal Prolonged discussion regarding potential complications and natural history of OP. She is very resistant to taking any other medications as she apparently had some GI effects from Fosamax. She is exercising regularly. Anticipated repeating BMD next year.  - VITAMIN D 25 Hydroxy (Vit-D Deficiency, Fractures)  2. Hyperlipidemia, unspecified hyperlipidemia type She is tolerating atorvastatin well with no adverse effects.   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 Continue current medications.          Lelon Huh, MD  [facility]

## 2016-08-03 LAB — HEPATIC FUNCTION PANEL
ALBUMIN: 4.6 g/dL (ref 3.5–4.8)
ALT: 17 IU/L (ref 0–32)
AST: 30 IU/L (ref 0–40)
Alkaline Phosphatase: 84 IU/L (ref 39–117)
BILIRUBIN TOTAL: 0.6 mg/dL (ref 0.0–1.2)
BILIRUBIN, DIRECT: 0.18 mg/dL (ref 0.00–0.40)
TOTAL PROTEIN: 6.9 g/dL (ref 6.0–8.5)

## 2016-08-03 LAB — LIPOPROTEIN ANALYSIS BY NMR
HDL Particle Number: 31.9 umol/L (ref >=30.5)
LDL PARTICLE NUMBER: 1083 nmol/L — AB (ref <1000)
LDL SIZE: 20.7 nm (ref >20.5)
LP-IR SCORE: 66 — AB (ref <=45)
Small LDL Particle Number: 537 nmol/L — ABNORMAL HIGH (ref <=527)

## 2016-08-03 LAB — VITAMIN D 25 HYDROXY (VIT D DEFICIENCY, FRACTURES): Vit D, 25-Hydroxy: 37.9 ng/mL (ref 30.0–100.0)

## 2016-08-05 ENCOUNTER — Telehealth: Payer: Self-pay

## 2016-08-05 NOTE — Telephone Encounter (Signed)
-----   Message from Birdie Sons, MD sent at 08/05/2016 12:16 PM EST ----- Cholesterol is much better, down from 181 to 126. All other labs are normal. Continue atorvastatin. Check labs yearly.

## 2016-08-05 NOTE — Telephone Encounter (Signed)
[id number] 

## 2016-08-08 LAB — LIPID PANEL W/O CHOL/HDL RATIO
CHOLESTEROL TOTAL: 126 mg/dL (ref 100–199)
HDL: 38 mg/dL — AB (ref >39)
LDL Calculated: 74 mg/dL (ref 0–99)
TRIGLYCERIDES: 68 mg/dL (ref 0–149)
VLDL CHOLESTEROL CAL: 14 mg/dL (ref 5–40)

## 2016-08-08 LAB — SPECIMEN STATUS REPORT

## 2016-08-08 NOTE — Telephone Encounter (Signed)
Patient advised as below. Patient verbalizes understanding and is in agreement with treatment plan.  

## 2016-09-01 DIAGNOSIS — H2513 Age-related nuclear cataract, bilateral: Secondary | ICD-10-CM | POA: Diagnosis not present

## 2016-11-17 DIAGNOSIS — I082 Rheumatic disorders of both aortic and tricuspid valves: Secondary | ICD-10-CM | POA: Diagnosis not present

## 2016-11-17 DIAGNOSIS — I341 Nonrheumatic mitral (valve) prolapse: Secondary | ICD-10-CM | POA: Diagnosis not present

## 2016-11-17 DIAGNOSIS — I351 Nonrheumatic aortic (valve) insufficiency: Secondary | ICD-10-CM | POA: Diagnosis not present

## 2016-11-22 ENCOUNTER — Other Ambulatory Visit: Payer: Self-pay | Admitting: Family Medicine

## 2016-11-22 ENCOUNTER — Other Ambulatory Visit: Payer: Self-pay | Admitting: *Deleted

## 2016-11-22 NOTE — Telephone Encounter (Signed)
Refill request for alprazolam 0.5 mg 1/2-1 tab at bedtime Last filled by MD on- 04/28/2016 #90 x1 Last Appt: 08/02/2016 Next Appt: none Please advise refill?

## 2016-11-22 NOTE — Telephone Encounter (Signed)
May phone in refill for 30 days (Alprazolam 0.5 mg 1/2-1 tablet by mouth at bedtime as needed for anxiety #30). Follow up with Dr. Caryn Section.

## 2016-11-22 NOTE — Telephone Encounter (Signed)
Please advise 

## 2016-11-22 NOTE — Telephone Encounter (Signed)
Rx called in to pharmacy. 

## 2016-11-22 NOTE — Telephone Encounter (Signed)
Pt calling to see if she can pick up the following medication or can it be called in to [facility] due to pt will be going out of town. Please return pt's call for information on medication. Thanks CC  ALPRAZolam (XANAX) 0.5 MG tablet

## 2017-02-08 ENCOUNTER — Other Ambulatory Visit: Payer: Self-pay | Admitting: Family Medicine

## 2017-02-21 ENCOUNTER — Ambulatory Visit (INDEPENDENT_AMBULATORY_CARE_PROVIDER_SITE_OTHER): Payer: Medicare Other | Admitting: Physician Assistant

## 2017-02-21 ENCOUNTER — Encounter: Payer: Self-pay | Admitting: Physician Assistant

## 2017-02-21 VITALS — BP 128/64 | HR 60 | Temp 97.9°F | Resp 16 | Wt 146.0 lb

## 2017-02-21 DIAGNOSIS — R5383 Other fatigue: Secondary | ICD-10-CM

## 2017-02-21 DIAGNOSIS — F339 Major depressive disorder, recurrent, unspecified: Secondary | ICD-10-CM

## 2017-02-21 DIAGNOSIS — G4700 Insomnia, unspecified: Secondary | ICD-10-CM

## 2017-02-21 DIAGNOSIS — E7800 Pure hypercholesterolemia, unspecified: Secondary | ICD-10-CM | POA: Diagnosis not present

## 2017-02-21 MED ORDER — ALPRAZOLAM 0.25 MG PO TABS: 0.2500 mg | ORAL_TABLET | Freq: Every evening | ORAL | 0 refills | Status: DC | PRN

## 2017-02-21 MED ORDER — SERTRALINE HCL 50 MG PO TABS: 100.0000 mg | ORAL_TABLET | Freq: Every day | ORAL | 0 refills | Status: DC

## 2017-02-21 NOTE — Progress Notes (Signed)
Patient: Elaine [name] Female    DOB: 03/25/1941   76 y.o.   MRN: [id number] Visit Date: 02/21/2017  Today's Provider: Trinna Post, PA-C   Chief Complaint  Patient presents with  . Fatigue   Subjective:    Depression         This is a chronic problem.The problem is unchanged (Pt reports her depression symptoms are "in pretty good control.").  Associated symptoms include decreased concentration, fatigue, insomnia, decreased interest and headaches.  Associated symptoms include no helplessness, no hopelessness, not irritable, no restlessness, no appetite change, no body aches, no myalgias, no indigestion, not sad and no suicidal ideas.  Past treatments include SSRIs - Selective serotonin reuptake inhibitors.  Compliance with treatment is good.    Fatigue: Patient complains of fatigue. Symptoms began several months ago. Sent[place] symptom the patient feels fatigue began with: none. Symptoms of her fatigue have been general malaise and lack of interest in usual activities. Patient describes the following psychologic symptoms: depression.  Patient denies fever, significant change in weight, unusual rashes, GI blood loss and witnessed or suspected sleep apnea. Symptoms have About the same.. Severity has been symptoms bothersome, but easily able to carry out all usual work/school/family activities. Previous visits for this problem: none. She says she has a lot on her mind, she worries a lot about her children and grandchildren. Has a female partner, relationship going well. She is having trouble sleeping. She has had this occasionally in the past. Unsuccessful treatments include trazodone, melatonin, Remeron. Success with prn Xanax.  Doesn't endorse daytime sleepiness. Denies snoring.   Of note, she has forgotten to take her statin for the past month.    No Known Allergies   Current Outpatient Prescriptions:  .  aspirin 81 MG tablet, Take 1 tablet (81 mg total) by mouth daily., Disp:  1 tablet, Rfl: 0 .  enalapril (VASOTEC) 5 MG tablet, TAKE 1 TABLET BY MOUTH  EVERY DAY, Disp: 90 tablet, Rfl: 3 .  Ibuprofen 200 MG CAPS, Take 1 capsule by mouth as needed. , Disp: , Rfl:  .  sertraline (ZOLOFT) 50 MG tablet, TAKE 1 TABLET BY MOUTH  DAILY, Disp: 90 tablet, Rfl: 4 .  ALPRAZolam (XANAX) 0.5 MG tablet, Take 0.5-1 tablets (0.25-0.5 mg total) by mouth at bedtime as needed for anxiety. TAKE 1/2 TO 1  TABLET EVERY EVENING AS NEEDED (Patient not taking: Reported on 02/21/2017), Disp: 90 tablet, Rfl: 1 .  atorvastatin (LIPITOR) 10 MG tablet, Take 1 tablet (10 mg total) by mouth daily. (Patient not taking: Reported on 02/21/2017), Disp: 90 tablet, Rfl: 3  Review of Systems  Constitutional: Positive for fatigue. Negative for activity change, appetite change, chills, diaphoresis, fever and unexpected weight change.  HENT: Negative for nosebleeds.   Respiratory: Negative.   Cardiovascular: Negative.   Gastrointestinal: Negative for abdominal distention, abdominal pain, anal bleeding, blood in stool, constipation, diarrhea, nausea, rectal pain and vomiting.  Endocrine: Positive for heat intolerance. Negative for cold intolerance, polydipsia, polyphagia and polyuria.  Genitourinary: Negative for hematuria.  Musculoskeletal: Negative for myalgias, neck pain and neck stiffness.  Neurological: Positive for headaches.  Hematological: Negative for adenopathy. Bruises/bleeds easily.  Psychiatric/Behavioral: Positive for decreased concentration, depression and sleep disturbance. Negative for suicidal ideas. The patient has insomnia.     Social History  Substance Use Topics  . Smoking status: Never Smoker  . Smokeless tobacco: Never Used  . Alcohol use 0.6 oz/week    1 Shots of  liquor per week     Comment: 1 drink daily   Objective:   BP 128/64 (BP Location: Left Arm, Patient Position: Sitting, Cuff Size: Normal)   Pulse 60   Temp 97.9 F (36.6 C) (Oral)   Resp 16   Wt 146 lb (66.2 kg)    BMI 26.07 kg/m  Vitals:   02/21/17 1116  BP: 128/64  Pulse: 60  Resp: 16  Temp: 97.9 F (36.6 C)  TempSrc: Oral  Weight: 146 lb (66.2 kg)     Physical Exam  Constitutional: She is oriented to person, place, and time. She appears well-developed and well-nourished. She is not irritable.  Cardiovascular: Normal rate and regular rhythm.   Murmur heard. 2/6 systolic murmur  Pulmonary/Chest: Effort normal and breath sounds normal.  Abdominal: Soft. Bowel sounds are normal.  Neurological: She is alert and oriented to person, place, and time.  Skin: Skin is warm and dry.  Psychiatric: She exhibits a depressed mood.        Assessment & Plan:     1. Hypercholesteremia  Will recheck panel. Please resume statin at bedtime.  - Lipid panel  2. Fatigue, unspecified type  Unsure etiology. Patient agrees to try increasing Zoloft in case this may be mood related. Will check labs as below. Had patient fill out Epworth, low score, low suspicion for sleep apnea.  - Comprehensive metabolic panel - CBC with Differential/Platelet - TSH  3. Insomnia, unspecified type  Will give prn xanax as below. Counseled on side effects including sedation, addiction, potential for abuse, respiratory depression. Advised against sharing medication. Advised against mixing medication with driving, alcohol, narcotic pain medication. Patient expresses understanding.  - ALPRAZolam (XANAX) 0.25 MG tablet; Take 1 tablet (0.25 mg total) by mouth at bedtime as needed for anxiety.  Dispense: 30 tablet; Refill: 0  4. Recurrent major depressive disorder, remission status unspecified (HCC)  - sertraline (ZOLOFT) 50 MG tablet; Take 2 tablets (100 mg total) by mouth daily.  Dispense: 180 tablet; Refill: 0  Return if symptoms worsen or fail to improve.  The entirety of the information documented in the History of Present Illness, Review of Systems and Physical Exam were personally obtained by me. Portions of this  information were initially documented by Ashley Royalty, CMA and reviewed by me for thoroughness and accuracy.         Trinna Post, PA-C  [facility]

## 2017-02-21 NOTE — Patient Instructions (Signed)

## 2017-02-22 LAB — COMPREHENSIVE METABOLIC PANEL
ALT: 13 IU/L (ref 0–32)
AST: 23 IU/L (ref 0–40)
Albumin/Globulin Ratio: 1.9 (ref 1.2–2.2)
Albumin: 4.2 g/dL (ref 3.5–4.8)
Alkaline Phosphatase: 74 IU/L (ref 39–117)
BUN/Creatinine Ratio: 18 (ref 12–28)
BUN: 14 mg/dL (ref 8–27)
Bilirubin Total: 0.7 mg/dL (ref 0.0–1.2)
CO2: 22 mmol/L (ref 20–29)
Calcium: 9.1 mg/dL (ref 8.7–10.3)
Chloride: 105 mmol/L (ref 96–106)
Creatinine, Ser: 0.78 mg/dL (ref 0.57–1.00)
GFR calc Af Amer: 85 mL/min/{1.73_m2} (ref >59)
GFR calc non Af Amer: 74 mL/min/{1.73_m2} (ref >59)
Globulin, Total: 2.2 g/dL (ref 1.5–4.5)
Glucose: 94 mg/dL (ref 65–99)
Potassium: 4.3 mmol/L (ref 3.5–5.2)
Sodium: 141 mmol/L (ref 134–144)
Total Protein: 6.4 g/dL (ref 6.0–8.5)

## 2017-02-22 LAB — CBC WITH DIFFERENTIAL/PLATELET
Basophils Absolute: 0 10*3/uL (ref 0.0–0.2)
Basos: 1 %
EOS (ABSOLUTE): 0.1 10*3/uL (ref 0.0–0.4)
Eos: 2 %
Hematocrit: 40.8 % (ref 34.0–46.6)
Hemoglobin: 13.5 g/dL (ref 11.1–15.9)
Immature Grans (Abs): 0 10*3/uL (ref 0.0–0.1)
Immature Granulocytes: 0 %
Lymphocytes Absolute: 1.3 10*3/uL (ref 0.7–3.1)
Lymphs: 34 %
MCH: 31.8 pg (ref 26.6–33.0)
MCHC: 33.1 g/dL (ref 31.5–35.7)
MCV: 96 fL (ref 79–97)
Monocytes Absolute: 0.2 10*3/uL (ref 0.1–0.9)
Monocytes: 6 %
Neutrophils Absolute: 2.3 10*3/uL (ref 1.4–7.0)
Neutrophils: 57 %
Platelets: 151 10*3/uL (ref 150–379)
RBC: 4.25 x10E6/uL (ref 3.77–5.28)
RDW: 13.4 % (ref 12.3–15.4)
WBC: 3.9 10*3/uL (ref 3.4–10.8)

## 2017-02-22 LAB — LIPID PANEL
Chol/HDL Ratio: 4.6 ratio — ABNORMAL HIGH (ref 0.0–4.4)
Cholesterol, Total: 175 mg/dL (ref 100–199)
HDL: 38 mg/dL — ABNORMAL LOW (ref >39)
LDL Calculated: 124 mg/dL — ABNORMAL HIGH (ref 0–99)
Triglycerides: 63 mg/dL (ref 0–149)
VLDL Cholesterol Cal: 13 mg/dL (ref 5–40)

## 2017-02-22 LAB — TSH: TSH: 0.565 u[IU]/mL (ref 0.450–4.500)

## 2017-03-27 ENCOUNTER — Telehealth: Payer: Self-pay | Admitting: Family Medicine

## 2017-03-27 NOTE — Telephone Encounter (Signed)
Left message regarding need to schedule AWV and if she could come at 2:30 on 04-06-17 prior to her OV with Dr. Anastasia Fiedler

## 2017-04-06 ENCOUNTER — Ambulatory Visit: Payer: Medicare Other | Admitting: Family Medicine

## 2017-04-06 ENCOUNTER — Ambulatory Visit: Payer: Medicare Other

## 2017-04-14 ENCOUNTER — Ambulatory Visit (INDEPENDENT_AMBULATORY_CARE_PROVIDER_SITE_OTHER): Payer: Medicare Other

## 2017-04-14 ENCOUNTER — Ambulatory Visit (INDEPENDENT_AMBULATORY_CARE_PROVIDER_SITE_OTHER): Payer: Medicare Other | Admitting: Family Medicine

## 2017-04-14 VITALS — BP 130/62 | HR 64 | Temp 97.8°F | Resp 16 | Ht 63.0 in | Wt 148.6 lb

## 2017-04-14 VITALS — BP 136/74 | HR 64 | Temp 97.8°F | Ht 63.0 in | Wt 148.6 lb

## 2017-04-14 DIAGNOSIS — G4700 Insomnia, unspecified: Secondary | ICD-10-CM | POA: Diagnosis not present

## 2017-04-14 DIAGNOSIS — Z1231 Encounter for screening mammogram for malignant neoplasm of breast: Secondary | ICD-10-CM

## 2017-04-14 DIAGNOSIS — F339 Major depressive disorder, recurrent, unspecified: Secondary | ICD-10-CM | POA: Diagnosis not present

## 2017-04-14 DIAGNOSIS — E7800 Pure hypercholesterolemia, unspecified: Secondary | ICD-10-CM | POA: Diagnosis not present

## 2017-04-14 DIAGNOSIS — Z0000 Encounter for general adult medical examination without abnormal findings: Secondary | ICD-10-CM | POA: Diagnosis not present

## 2017-04-14 DIAGNOSIS — M810 Age-related osteoporosis without current pathological fracture: Secondary | ICD-10-CM

## 2017-04-14 NOTE — Progress Notes (Signed)
Patient: Elaine [name] Female    DOB: Dec 05, 1940   76 y.o.   MRN: [id number] Visit Date: 04/14/2017  Today's Provider: Lavon Paganini, MD   Chief Complaint  Patient presents with  . Insomnia  . Depression  . Hyperlipidemia   Subjective:    HPI   Elaine [name] presents for follow up of her annual wellness visit. She is also establishing care with new PCP.   Last mammogram- 09/24/2015-BI-RADS 1 Last BMD- 04/30/2014- osteoporosis - failed fosamax due to side effects - does not want to recheck Last colonoscopy- 02/15/2013- diverticulosis and internal hemorrhoids. No repeat date per pt.   Follow up for Insomina  The patient was last seen for this by Elaine [name] about 2 months ago. Changes made at last visit include adding Xanax 0.25 mg QHS PRN for anxiety.  She reports excellent compliance with treatment. She reports she has not had to take this medication "in a long time".  Thinks 30 tablets will last ~ 27yr She feels that condition is Improved. She is not having side effects.   ------------------------------------------------------------------------------------   Depression, Follow-up  She was last seen for this by Elaine [name] about 2 months ago. Changes made at last visit include increasing Zoloft to 100 mg. This was alos increased to improve fatigue.   She reports poor compliance with treatment. She is still taking 50 mg of Zoloft. She is not having side effects.   Current symptoms include: depressed mood and difficulty concentrating She feels she is Improved since last visit.  ------------------------------------------------------------------------   Lipid/Cholesterol, Follow-up:   Last seen for this 2 months ago.  Management changes since that visit include checking, which were elevated because pt had forgot to take her Lipitor for about 1 month prior to LOV.  States that she had side effects to Lipitor and does not wish to take another statin.  Knows risk of heart  disease/stroke. . Last Lipid Panel:    Component Value Date/Time   CHOL 175 02/21/2017 1213   TRIG 63 02/21/2017 1213   HDL 38 (L) 02/21/2017 1213   CHOLHDL 4.6 (H) 02/21/2017 1213   LDLCALC 124 (H) 02/21/2017 1213    Risk factors for vascular disease include hypercholesterolemia  Current diet: in general, a "healthy" diet   Current exercise: goes to gym three times per week. 15 minutes on elliptical, 15 minutes on stationary bike, 15 minutes "power walking", 15 minutes of stretching and then weight machines,  Wt Readings from Last 3 Encounters:  04/14/17 148 lb 9.6 oz (67.4 kg)  04/14/17 148 lb 9.6 oz (67.4 kg)  02/21/17 146 lb (66.2 kg)    -------------------------------------------------------------------   No Known Allergies   Current Outpatient Prescriptions:  .  ALPRAZolam (XANAX) 0.25 MG tablet, Take 1 tablet (0.25 mg total) by mouth at bedtime as needed for anxiety., Disp: 30 tablet, Rfl: 0 .  aspirin EC 81 MG tablet, Take 81 mg by mouth daily., Disp: , Rfl:  .  enalapril (VASOTEC) 5 MG tablet, TAKE 1 TABLET BY MOUTH  EVERY DAY, Disp: 90 tablet, Rfl: 3 .  Ibuprofen 200 MG CAPS, Take 1 capsule by mouth as needed. , Disp: , Rfl:  .  sertraline (ZOLOFT) 50 MG tablet, Take 2 tablets (100 mg total) by mouth daily. (Patient taking differently: Take 50 mg by mouth daily. ), Disp: 180 tablet, Rfl: 0  Review of Systems  Constitutional: Negative.   HENT: Positive for tinnitus and voice change. Negative for congestion, dental problem, drooling, ear  discharge, ear pain, facial swelling, hearing loss, mouth sores, nosebleeds, postnasal drip, rhinorrhea, sinus pain, sinus pressure, sneezing, sore throat and trouble swallowing.   Eyes: Positive for photophobia. Negative for pain, discharge, redness, itching and visual disturbance.  Respiratory: Negative.   Cardiovascular: Negative.   Gastrointestinal: Negative.   Endocrine: Negative.   Genitourinary: Negative.   Musculoskeletal:  Positive for arthralgias. Negative for back pain, gait problem, joint swelling, myalgias, neck pain and neck stiffness.  Skin: Negative.   Allergic/Immunologic: Negative.   Neurological: Negative.   Hematological: Negative for adenopathy. Bruises/bleeds easily.  Psychiatric/Behavioral: Positive for decreased concentration. Negative for agitation, behavioral problems, confusion, dysphoric mood, hallucinations, self-injury, sleep disturbance and suicidal ideas. The patient is not nervous/anxious and is not hyperactive.     Social History  Substance Use Topics  . Smoking status: Never Smoker  . Smokeless tobacco: Never Used  . Alcohol use 4.2 oz/week    7 Shots of liquor per week     Comment: 1 drink daily   Objective:   BP 130/62 (BP Location: Left Arm, Patient Position: Sitting, Cuff Size: Normal)   Pulse 64   Temp 97.8 F (36.6 C) (Oral)   Resp 16   Ht 5\' 3"  (1.6 m)   Wt 148 lb 9.6 oz (67.4 kg)   BMI 26.32 kg/m  Vitals:   04/14/17 1105  BP: 130/62  Pulse: 64  Resp: 16  Temp: 97.8 F (36.6 C)  TempSrc: Oral  Weight: 148 lb 9.6 oz (67.4 kg)  Height: 5\' 3"  (1.6 m)     Physical Exam  Constitutional: She is oriented to person, place, and time. She appears well-developed and well-nourished. No distress.  HENT:  Head: Normocephalic and atraumatic.  Right Ear: External ear normal.  Left Ear: External ear normal.  Nose: Nose normal.  Mouth/Throat: Oropharynx is clear and moist.  Eyes: Conjunctivae and EOM are normal. No scleral icterus.  Neck: Neck supple. No thyromegaly present.  Cardiovascular: Normal rate, regular rhythm and intact distal pulses.   Murmur heard. Pulmonary/Chest: Effort normal and breath sounds normal. No respiratory distress. She has no wheezes. She has no rales.  Abdominal: Soft. Bowel sounds are normal. She exhibits no distension. There is no tenderness. There is no rebound and no guarding.  Musculoskeletal: She exhibits no edema or deformity.    Lymphadenopathy:    She has no cervical adenopathy.  Neurological: She is alert and oriented to person, place, and time.  Skin: Skin is warm and dry. No rash noted.  Psychiatric: She has a normal mood and affect. Her behavior is normal.  Vitals reviewed.  Depression screen [facility] 2/9 04/14/2017 04/14/2017 08/02/2016 05/28/2015  Decreased Interest 1 1 0 0  Down, Depressed, Hopeless 0 0 0 0  PHQ - 2 Score 1 1 0 0  Altered sleeping 1 - - -  Tired, decreased energy 1 - - -  Change in appetite 0 - - -  Feeling bad or failure about yourself  0 - - -  Trouble concentrating 0 - - -  Moving slowly or fidgety/restless 0 - - -  Suicidal thoughts 0 - - -  PHQ-9 Score 3 - - -  Difficult doing work/chores Not difficult at all - - -       Assessment & Plan:         Healthcare maintenance Screening mammogram ordered Patient declines BMD screening Advised annual flu shot, patient reports it is too early to get one today and she will get one  in the fall Next CPE in one year  Hypercholesteremia Discussed risks and benefits of hyper lipidemia and statins with patient 10 year ASCVD risk of 19.5% Patient declines statin therapy at this time  Insomnia Well-controlled Advised on risks of benzodiazepines  Major depressive disorder Well-controlled Continue Zoloft 50 mg daily Repeat pH Q9 next CPE in one year  Osteoporosis, 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 Portions of this information were initially documented by Raquel Sarna Ratchford, CMA and reviewed by me for thoroughness and accuracy.    Lavon Paganini, MD  [facility]

## 2017-04-14 NOTE — Assessment & Plan Note (Signed)
Well-controlled Continue Zoloft 50 mg daily Repeat pH Q9 next CPE in one year

## 2017-04-14 NOTE — Progress Notes (Signed)
Subjective:   Elaine [name] is a 76 y.o. female who presents for Medicare Annual (Subsequent) preventive examination.  Review of Systems:  N/A  Cardiac Risk Factors include: advanced age (>28men, >16 women);dyslipidemia     Objective:     Vitals: BP 136/74 (BP Location: Left Arm)   Pulse 64   Temp 97.8 F (36.6 C) (Oral)   Ht 5\' 3"  (1.6 m)   Wt 148 lb 9.6 oz (67.4 kg)   BMI 26.32 kg/m   Body mass index is 26.32 kg/m.   Tobacco History  Smoking Status  . Never Smoker  Smokeless Tobacco  . Never Used     Counseling given: Not Answered   Past Medical History:  Diagnosis Date  . Depression    Past Surgical History:  Procedure Laterality Date  . ANKLE SURGERY    . AUGMENTATION MAMMAPLASTY Bilateral 2006  . BREAST BIOPSY Right 1984   EXCISIONAL - NEG  . BREAST ENHANCEMENT SURGERY Bilateral   . HEMORRHOID SURGERY  2011   Hemorrhoidectomy  . TONSILLECTOMY AND ADENOIDECTOMY    . TUBAL LIGATION     Family History  Problem Relation Age of Onset  . Heart attack Father   . Hypertension Sister        pulmonary HTN  . Breast cancer Maternal Aunt    History  Sexual Activity  . Sexual activity: Not on file    Outpatient Encounter Prescriptions as of 04/14/2017  Medication Sig  . ALPRAZolam (XANAX) 0.25 MG tablet Take 1 tablet (0.25 mg total) by mouth at bedtime as needed for anxiety.  Marland Kitchen aspirin EC 81 MG tablet Take 81 mg by mouth daily.  . enalapril (VASOTEC) 5 MG tablet TAKE 1 TABLET BY MOUTH  EVERY DAY  . Ibuprofen 200 MG CAPS Take 1 capsule by mouth as needed.   . sertraline (ZOLOFT) 50 MG tablet Take 2 tablets (100 mg total) by mouth daily.  . [DISCONTINUED] aspirin 81 MG tablet Take 1 tablet (81 mg total) by mouth daily.  . [DISCONTINUED] atorvastatin (LIPITOR) 10 MG tablet Take 1 tablet (10 mg total) by mouth daily. (Patient not taking: Reported on 04/14/2017)   No facility-administered encounter medications on file as of 04/14/2017.     Activities  of Daily Living In your present state of health, do you have any difficulty performing the following activities: 04/14/2017 08/02/2016  Hearing? Tempie Donning  Vision? Y N  Difficulty concentrating or making decisions? Y Y  Comment some issues remembering sometimes  Walking or climbing stairs? N N  Dressing or bathing? N N  Doing errands, shopping? N N  Preparing Food and eating ? N N  Using the Toilet? N N  In the past six months, have you accidently leaked urine? Y Y  Comment - occasionally  Do you have problems with loss of bowel control? Y Y  Comment occasionally due to eating certain types of lettuce- pt has had this for awhile occasionally  Managing your Medications? N N  Managing your Finances? N N  Housekeeping or managing your Housekeeping? N N  Some recent data might be hidden    Patient Care Team: Virginia Crews, MD as PCP - General (Family Medicine) Michaelle Birks Scarlette Ar, MD as Referring Physician (Cardiology) Arelia Sneddon, OD as Consulting Physician (Optometry)    Assessment:     Exercise Activities and Dietary recommendations Current Exercise Habits: Structured exercise class, Type of exercise: Other - see comments;treadmill;walking;stretching;strength training/weights (elpitical, stationary bicycle), Time (Minutes): >  60, Frequency (Times/Week): 3, Weekly Exercise (Minutes/Week): 0, Intensity: Mild, Exercise limited by: None identified  Goals    . Exercise 150 minutes per week (moderate activity)    . Increase water intake          Starting 08/02/16, I will increase my water intake to 4 glasses every day.      Fall Risk Fall Risk  04/14/2017 08/02/2016 05/28/2015  Falls in the past year? No No No   Depression Screen PHQ 2/9 Scores 04/14/2017 08/02/2016 05/28/2015  PHQ - 2 Score 1 0 0     Cognitive Function- Pt declined screening today.     6CIT Screen 08/02/2016  What Year? 0 points  What month? 0 points  What time? 0 points  Count back from 20 0  points  Months in reverse 0 points  Repeat phrase 2 points  Total Score 2    Immunization History  Administered Date(s) Administered  . Influenza,inj,Quad PF,6+ Mos 08/28/2015  . Pneumococcal Conjugate-13 05/28/2014  . Pneumococcal Polysaccharide-23 08/28/2015  . Zoster 11/03/2010   Screening Tests Health Maintenance  Topic Date Due  . INFLUENZA VACCINE  03/22/2017  . TETANUS/TDAP  08/02/2017 (Originally 03/08/1960)  . DEXA SCAN  04/30/2017  . PNA vac Low Risk Adult  Completed      Plan:  I have personally reviewed and addressed the Medicare Annual Wellness questionnaire and have noted the following in the patient's chart:  A. Medical and social history B. Use of alcohol, tobacco or illicit drugs  C. Current medications and supplements D. Functional ability and status E.  Nutritional status F.  Physical activity G. Advance directives H. List of other physicians I.  Hospitalizations, surgeries, and ER visits in previous 12 months J.  [place] such as hearing and vision if needed, cognitive and depression L. Referrals and appointments - none  In addition, I have reviewed and discussed with patient certain preventive protocols, quality metrics, and best practice recommendations. A written personalized care plan for preventive services as well as general preventive health recommendations were provided to patient.  See attached scanned questionnaire for additional information.   Signed,  Fabio Neighbors, LPN Nurse Health Advisor   MD Recommendations: None. Pt declined flu vaccine today. Will until later in the season.

## 2017-04-14 NOTE — Patient Instructions (Signed)

## 2017-04-14 NOTE — Assessment & Plan Note (Signed)
Patient with intolerance to bisphosphonates Declines repeat screening

## 2017-04-14 NOTE — Assessment & Plan Note (Signed)
Well-controlled Advised on risks of benzodiazepines

## 2017-04-14 NOTE — Assessment & Plan Note (Signed)
Discussed risks and benefits of hyper lipidemia and statins with patient 10 year ASCVD risk of 19.5% Patient declines statin therapy at this time

## 2017-04-14 NOTE — Assessment & Plan Note (Signed)
Screening mammogram ordered Patient declines BMD screening Advised annual flu shot, patient reports it is too early to get one today and she will get one in the fall Next CPE in one year

## 2017-04-14 NOTE — Patient Instructions (Signed)
Elaine [name] , Thank you for taking time to come for your Medicare Wellness Visit. I appreciate your ongoing commitment to your health goals. Please review the following plan we discussed and let me know if I can assist you in the future.   Screening recommendations/referrals: Colonoscopy: up to date, no longer due Mammogram: up to date Bone Density: up to date Recommended yearly ophthalmology/optometry visit for glaucoma screening and checkup Recommended yearly dental visit for hygiene and checkup  Vaccinations: Influenza vaccine: declined today Pneumococcal vaccine: completed series Tdap vaccine: declined previously Shingles vaccine: completed 11/03/10   Advanced directives: Advance directive discussed with you today. I have provided a copy for you to complete at home and have notarized. Once this is complete please bring a copy in to our office so we can scan it into your chart.  Conditions/risks identified: Recommend to drink 4-6 glasses of water every day.   Next appointment: None, need to schedule 1 year AWV.   Preventive Care 107 Years and Older, Female Preventive care refers to lifestyle choices and visits with your health care provider that can promote health and wellness. What does preventive care include?  A yearly physical exam. This is also called an annual well check.  Dental exams once or twice a year.  Routine eye exams. Ask your health care provider how often you should have your eyes checked.  Personal lifestyle choices, including:  Daily care of your teeth and gums.  Regular physical activity.  Eating a healthy diet.  Avoiding tobacco and drug use.  Limiting alcohol use.  Practicing safe sex.  Taking low-dose aspirin every day.  Taking vitamin and mineral supplements as recommended by your health care provider. What happens during an annual well check? The services and screenings done by your health care provider during your annual well check will  depend on your age, overall health, lifestyle risk factors, and family history of disease. Counseling  Your health care provider may ask you questions about your:  Alcohol use.  Tobacco use.  Drug use.  Emotional well-being.  Home and relationship well-being.  Sexual activity.  Eating habits.  History of falls.  Memory and ability to understand (cognition).  Work and work Statistician.  Reproductive health. Screening  You may have the following tests or measurements:  Height, weight, and BMI.  Blood pressure.  Lipid and cholesterol levels. These may be checked every 5 years, or more frequently if you are over 10 years old.  Skin check.  Lung cancer screening. You may have this screening every year starting at age 29 if you have a 30-pack-year history of smoking and currently smoke or have quit within the past 15 years.  Fecal occult blood test (FOBT) of the stool. You may have this test every year starting at age 74.  Flexible sigmoidoscopy or colonoscopy. You may have a sigmoidoscopy every 5 years or a colonoscopy every 10 years starting at age 61.  Hepatitis C blood test.  Hepatitis B blood test.  Sexually transmitted disease (STD) testing.  Diabetes screening. This is done by checking your blood sugar (glucose) after you have not eaten for a while (fasting). You may have this done every 1-3 years.  Bone density scan. This is done to screen for osteoporosis. You may have this done starting at age 90.  Mammogram. This may be done every 1-2 years. Talk to your health care provider about how often you should have regular mammograms. Talk with your health care provider about your test  results, treatment options, and if necessary, the need for more tests. Vaccines  Your health care provider may recommend certain vaccines, such as:  Influenza vaccine. This is recommended every year.  Tetanus, diphtheria, and acellular pertussis (Tdap, Td) vaccine. You may need a  Td booster every 10 years.  Zoster vaccine. You may need this after age 60.  Pneumococcal 13-valent conjugate (PCV13) vaccine. One dose is recommended after age 90.  Pneumococcal polysaccharide (PPSV23) vaccine. One dose is recommended after age 56. Talk to your health care provider about which screenings and vaccines you need and how often you need them. This information is not intended to replace advice given to you by your health care provider. Make sure you discuss any questions you have with your health care provider. Document Released: 09/04/2015 Document Revised: 04/27/2016 Document Reviewed: 06/09/2015 Elsevier Interactive Patient Education  2017 [place] Prevention in the Home Falls can cause injuries. They can happen to people of all ages. There are many things you can do to make your home safe and to help prevent falls. What can I do on the outside of my home?  Regularly fix the edges of walkways and driveways and fix any cracks.  Remove anything that might make you trip as you walk through a door, such as a raised step or threshold.  Trim any bushes or trees on the path to your home.  Use bright outdoor lighting.  Clear any walking paths of anything that might make someone trip, such as rocks or tools.  Regularly check to see if handrails are loose or broken. Make sure that both sides of any steps have handrails.  Any raised decks and porches should have guardrails on the edges.  Have any leaves, snow, or ice cleared regularly.  Use sand or salt on walking paths during winter.  Clean up any spills in your garage right away. This includes oil or grease spills. What can I do in the bathroom?  Use night lights.  Install grab bars by the toilet and in the tub and shower. Do not use towel bars as grab bars.  Use non-skid mats or decals in the tub or shower.  If you need to sit down in the shower, use a plastic, non-slip stool.  Keep the floor dry. Clean  up any water that spills on the floor as soon as it happens.  Remove soap buildup in the tub or shower regularly.  Attach bath mats securely with double-sided non-slip rug tape.  Do not have throw rugs and other things on the floor that can make you trip. What can I do in the bedroom?  Use night lights.  Make sure that you have a light by your bed that is easy to reach.  Do not use any sheets or blankets that are too big for your bed. They should not hang down onto the floor.  Have a firm chair that has side arms. You can use this for support while you get dressed.  Do not have throw rugs and other things on the floor that can make you trip. What can I do in the kitchen?  Clean up any spills right away.  Avoid walking on wet floors.  Keep items that you use a lot in easy-to-reach places.  If you need to reach something above you, use a strong step stool that has a grab bar.  Keep electrical cords out of the way.  Do not use floor polish or wax that makes  floors slippery. If you must use wax, use non-skid floor wax.  Do not have throw rugs and other things on the floor that can make you trip. What can I do with my stairs?  Do not leave any items on the stairs.  Make sure that there are handrails on both sides of the stairs and use them. Fix handrails that are broken or loose. Make sure that handrails are as long as the stairways.  Check any carpeting to make sure that it is firmly attached to the stairs. Fix any carpet that is loose or worn.  Avoid having throw rugs at the top or bottom of the stairs. If you do have throw rugs, attach them to the floor with carpet tape.  Make sure that you have a light switch at the top of the stairs and the bottom of the stairs. If you do not have them, ask someone to add them for you. What else can I do to help prevent falls?  Wear shoes that:  Do not have high heels.  Have rubber bottoms.  Are comfortable and fit you well.  Are  closed at the toe. Do not wear sandals.  If you use a stepladder:  Make sure that it is fully opened. Do not climb a closed stepladder.  Make sure that both sides of the stepladder are locked into place.  Ask someone to hold it for you, if possible.  Clearly mark and make sure that you can see:  Any grab bars or handrails.  First and last steps.  Where the edge of each step is.  Use tools that help you move around (mobility aids) if they are needed. These include:  Canes.  Walkers.  Scooters.  Crutches.  Turn on the lights when you go into a dark area. Replace any light bulbs as soon as they burn out.  Set up your furniture so you have a clear path. Avoid moving your furniture around.  If any of your floors are uneven, fix them.  If there are any pets around you, be aware of where they are.  Review your medicines with your doctor. Some medicines can make you feel dizzy. This can increase your chance of falling. Ask your doctor what other things that you can do to help prevent falls. This information is not intended to replace advice given to you by your health care provider. Make sure you discuss any questions you have with your health care provider. Document Released: 06/04/2009 Document Revised: 01/14/2016 Document Reviewed: 09/12/2014 Elsevier Interactive Patient Education  2017 [employer].

## 2017-04-16 ENCOUNTER — Other Ambulatory Visit: Payer: Self-pay | Admitting: Physician Assistant

## 2017-04-16 DIAGNOSIS — F339 Major depressive disorder, recurrent, unspecified: Secondary | ICD-10-CM

## 2017-07-24 ENCOUNTER — Other Ambulatory Visit: Payer: Self-pay | Admitting: Family Medicine

## 2017-11-16 DIAGNOSIS — I341 Nonrheumatic mitral (valve) prolapse: Secondary | ICD-10-CM | POA: Diagnosis not present

## 2017-11-16 DIAGNOSIS — I351 Nonrheumatic aortic (valve) insufficiency: Secondary | ICD-10-CM | POA: Diagnosis not present

## 2018-01-01 ENCOUNTER — Ambulatory Visit (INDEPENDENT_AMBULATORY_CARE_PROVIDER_SITE_OTHER): Payer: Medicare Other | Admitting: Family Medicine

## 2018-01-01 ENCOUNTER — Encounter: Payer: Self-pay | Admitting: Family Medicine

## 2018-01-01 VITALS — BP 120/60 | HR 68 | Temp 98.1°F | Resp 16 | Wt 151.0 lb

## 2018-01-01 DIAGNOSIS — B9789 Other viral agents as the cause of diseases classified elsewhere: Secondary | ICD-10-CM | POA: Diagnosis not present

## 2018-01-01 DIAGNOSIS — J069 Acute upper respiratory infection, unspecified: Secondary | ICD-10-CM

## 2018-01-01 NOTE — Progress Notes (Signed)
Patient: Elaine [name] Female    DOB: 12/31/1940   77 y.o.   MRN: [id number] Visit Date: 01/01/2018  Today's Provider: Lavon Paganini, MD   I, Martha Clan, CMA, am acting as scribe for Lavon Paganini, MD.  Chief Complaint  Patient presents with  . URI   Subjective:    URI   This is a new problem. Episode onset: x 2 days. The problem has been unchanged. There has been no fever. Associated symptoms include congestion, coughing (dry), headaches, a plugged ear sensation (noticed this after having new hearing aids placed, alongside headache and lightheadedness), rhinorrhea and sneezing. Pertinent negatives include no abdominal pain, chest pain, ear pain, sinus pain, sore throat, swollen glands or wheezing. Elaine [name] has tried NSAIDs for the symptoms. The treatment provided moderate relief.      No Known Allergies   Current Outpatient Medications:  .  ALPRAZolam (XANAX) 0.25 MG tablet, Take 1 tablet (0.25 mg total) by mouth at bedtime as needed for anxiety., Disp: 30 tablet, Rfl: 0 .  aspirin EC 81 MG tablet, Take 81 mg by mouth daily., Disp: , Rfl:  .  enalapril (VASOTEC) 5 MG tablet, TAKE 1 TABLET BY MOUTH  EVERY DAY, Disp: 90 tablet, Rfl: 3 .  Ibuprofen 200 MG CAPS, Take 1 capsule by mouth as needed. , Disp: , Rfl:  .  sertraline (ZOLOFT) 50 MG tablet, Take 1 tablet (50 mg total) by mouth daily., Disp: 90 tablet, Rfl: 1  Review of Systems  HENT: Positive for congestion, rhinorrhea and sneezing. Negative for ear pain, sinus pain and sore throat.   Respiratory: Positive for cough (dry). Negative for wheezing.   Cardiovascular: Negative for chest pain.  Gastrointestinal: Negative for abdominal pain.  Neurological: Positive for headaches.    Social History   Tobacco Use  . Smoking status: Never Smoker  . Smokeless tobacco: Never Used  Substance Use Topics  . Alcohol use: Yes    Alcohol/week: 4.2 oz    Types: 7 Shots of liquor per week    Comment: 1 drink daily     Objective:   BP 120/60 (BP Location: Left Arm, Patient Position: Sitting, Cuff Size: Normal)   Pulse 68   Temp 98.1 F (36.7 C) (Oral)   Resp 16   Wt 151 lb (68.5 kg)   SpO2 96%   BMI 26.75 kg/m  Vitals:   01/01/18 1516  BP: 120/60  Pulse: 68  Resp: 16  Temp: 98.1 F (36.7 C)  TempSrc: Oral  SpO2: 96%  Weight: 151 lb (68.5 kg)     Physical Exam  Constitutional: Elaine [name] is oriented to person, place, and time. Elaine [name] appears well-developed and well-nourished. No distress.  HENT:  Head: Normocephalic and atraumatic.  Right Ear: Tympanic membrane, external ear and ear canal normal.  Left Ear: Tympanic membrane, external ear and ear canal normal.  Nose: Mucosal edema present. Right sinus exhibits no maxillary sinus tenderness and no frontal sinus tenderness. Left sinus exhibits no maxillary sinus tenderness and no frontal sinus tenderness.  Mouth/Throat: Uvula is midline, oropharynx is clear and moist and mucous membranes are normal.  Eyes: Pupils are equal, round, and reactive to light. Conjunctivae are normal. Right eye exhibits no discharge. Left eye exhibits no discharge. No scleral icterus.  Neck: Neck supple. No thyromegaly present.  Cardiovascular: Normal rate, regular rhythm, normal heart sounds and intact distal pulses.  No murmur heard. Pulmonary/Chest: Effort normal and breath sounds normal. No respiratory distress. Elaine [name]  has no wheezes.  Coarse breath sounds that clear with coughing  Musculoskeletal: Elaine [name] exhibits no edema.  Lymphadenopathy:    Elaine [name] has no cervical adenopathy.  Neurological: Elaine [name] is alert and oriented to person, place, and time.  Skin: Skin is warm and dry. Capillary refill takes less than 2 seconds. No rash noted.  Psychiatric: Elaine [name] has a normal mood and affect. Elaine [name] behavior is normal.  Vitals reviewed.      Assessment & Plan:    1. Viral URI with cough - symptoms and exam c/w viral URI and maybe some bronchitic changes- no evidence of strep  pharyngitis, CAP, AOM, bacterial sinusitis, or other bacterial infection - discussed symptomatic management, natural course, and return precautions   Return if symptoms worsen or fail to improve.   The entirety of the information documented in the History of Present Illness, Review of Systems and Physical Exam were personally obtained by me. Portions of this information were initially documented by Raquel Sarna Ratchford, CMA and reviewed by me for thoroughness and accuracy.    Virginia Crews, MD, MPH [facility] 01/01/2018 3:54 PM

## 2018-01-01 NOTE — Patient Instructions (Signed)
Upper Respiratory Infection, Adult Most upper respiratory infections (URIs) are caused by a virus. A URI affects the nose, throat, and upper air passages. The most common type of URI is often called "the common cold." Follow these instructions at home:  Take medicines only as told by your doctor.  Gargle warm saltwater or take cough drops to comfort your throat as told by your doctor.  Use a warm mist humidifier or inhale steam from a shower to increase air moisture. This may make it easier to breathe.  Drink enough fluid to keep your pee (urine) clear or pale yellow.  Eat soups and other clear broths.  Have a healthy diet.  Rest as needed.  Go back to work when your fever is gone or your doctor says it is okay. ? You may need to stay home longer to avoid giving your URI to others. ? You can also wear a face mask and wash your hands often to prevent spread of the virus.  Use your inhaler more if you have asthma.  Do not use any tobacco products, including cigarettes, chewing tobacco, or electronic cigarettes. If you need help quitting, ask your doctor. Contact a doctor if:  You are getting worse, not better.  Your symptoms are not helped by medicine.  You have chills.  You are getting more short of breath.  You have brown or red mucus.  You have yellow or brown discharge from your nose.  You have pain in your face, especially when you bend forward.  You have a fever.  You have puffy (swollen) neck glands.  You have pain while swallowing.  You have white areas in the back of your throat. Get help right away if:  You have very bad or constant: ? Headache. ? Ear pain. ? Pain in your forehead, behind your eyes, and over your cheekbones (sinus pain). ? Chest pain.  You have long-lasting (chronic) lung disease and any of the following: ? Wheezing. ? Long-lasting cough. ? Coughing up blood. ? A change in your usual mucus.  You have a stiff neck.  You have  changes in your: ? Vision. ? Hearing. ? Thinking. ? Mood. This information is not intended to replace advice given to you by your health care provider. Make sure you discuss any questions you have with your health care provider. Document Released: 01/25/2008 Document Revised: 04/10/2016 Document Reviewed: 11/13/2013 Elsevier Interactive Patient Education  2018 Elsevier Inc[employer]  

## 2018-01-16 ENCOUNTER — Other Ambulatory Visit: Payer: Self-pay | Admitting: Family Medicine

## 2018-01-16 DIAGNOSIS — F339 Major depressive disorder, recurrent, unspecified: Secondary | ICD-10-CM

## 2018-04-12 DIAGNOSIS — M47896 Other spondylosis, lumbar region: Secondary | ICD-10-CM | POA: Diagnosis not present

## 2018-04-12 DIAGNOSIS — S32050B Wedge compression fracture of fifth lumbar vertebra, initial encounter for open fracture: Secondary | ICD-10-CM | POA: Diagnosis not present

## 2018-04-18 ENCOUNTER — Ambulatory Visit: Payer: Self-pay

## 2018-04-18 ENCOUNTER — Encounter: Payer: Self-pay | Admitting: Family Medicine

## 2018-04-26 DIAGNOSIS — S32050B Wedge compression fracture of fifth lumbar vertebra, initial encounter for open fracture: Secondary | ICD-10-CM | POA: Diagnosis not present

## 2018-04-26 DIAGNOSIS — M47896 Other spondylosis, lumbar region: Secondary | ICD-10-CM | POA: Diagnosis not present

## 2018-05-02 DIAGNOSIS — H2513 Age-related nuclear cataract, bilateral: Secondary | ICD-10-CM | POA: Diagnosis not present

## 2018-05-17 DIAGNOSIS — S32050B Wedge compression fracture of fifth lumbar vertebra, initial encounter for open fracture: Secondary | ICD-10-CM | POA: Diagnosis not present

## 2018-05-17 DIAGNOSIS — M47896 Other spondylosis, lumbar region: Secondary | ICD-10-CM | POA: Diagnosis not present

## 2018-05-23 DIAGNOSIS — M545 Low back pain: Secondary | ICD-10-CM | POA: Diagnosis not present

## 2018-05-23 DIAGNOSIS — M6281 Muscle weakness (generalized): Secondary | ICD-10-CM | POA: Diagnosis not present

## 2018-05-23 DIAGNOSIS — R262 Difficulty in walking, not elsewhere classified: Secondary | ICD-10-CM | POA: Diagnosis not present

## 2018-05-28 DIAGNOSIS — M545 Low back pain: Secondary | ICD-10-CM | POA: Diagnosis not present

## 2018-05-28 DIAGNOSIS — M6281 Muscle weakness (generalized): Secondary | ICD-10-CM | POA: Diagnosis not present

## 2018-05-28 DIAGNOSIS — R262 Difficulty in walking, not elsewhere classified: Secondary | ICD-10-CM | POA: Diagnosis not present

## 2018-05-30 DIAGNOSIS — M545 Low back pain: Secondary | ICD-10-CM | POA: Diagnosis not present

## 2018-05-30 DIAGNOSIS — R262 Difficulty in walking, not elsewhere classified: Secondary | ICD-10-CM | POA: Diagnosis not present

## 2018-05-30 DIAGNOSIS — M6281 Muscle weakness (generalized): Secondary | ICD-10-CM | POA: Diagnosis not present

## 2018-06-08 DIAGNOSIS — S32010A Wedge compression fracture of first lumbar vertebra, initial encounter for closed fracture: Secondary | ICD-10-CM | POA: Diagnosis not present

## 2018-06-23 ENCOUNTER — Other Ambulatory Visit: Payer: Self-pay | Admitting: Family Medicine

## 2018-06-23 DIAGNOSIS — F339 Major depressive disorder, recurrent, unspecified: Secondary | ICD-10-CM

## 2018-07-06 DIAGNOSIS — S32010A Wedge compression fracture of first lumbar vertebra, initial encounter for closed fracture: Secondary | ICD-10-CM | POA: Diagnosis not present

## 2018-07-06 DIAGNOSIS — M810 Age-related osteoporosis without current pathological fracture: Secondary | ICD-10-CM | POA: Diagnosis not present

## 2018-07-06 DIAGNOSIS — M8008XA Age-related osteoporosis with current pathological fracture, vertebra(e), initial encounter for fracture: Secondary | ICD-10-CM | POA: Diagnosis not present

## 2018-08-10 ENCOUNTER — Ambulatory Visit: Payer: Self-pay

## 2018-08-10 ENCOUNTER — Encounter: Payer: Self-pay | Admitting: Family Medicine

## 2018-08-21 ENCOUNTER — Ambulatory Visit (INDEPENDENT_AMBULATORY_CARE_PROVIDER_SITE_OTHER): Payer: Medicare Other | Admitting: Family Medicine

## 2018-08-21 ENCOUNTER — Encounter: Payer: Self-pay | Admitting: Family Medicine

## 2018-08-21 ENCOUNTER — Other Ambulatory Visit: Payer: Self-pay

## 2018-08-21 VITALS — BP 136/64 | HR 84 | Temp 97.7°F | Ht 63.0 in | Wt 143.4 lb

## 2018-08-21 DIAGNOSIS — J069 Acute upper respiratory infection, unspecified: Secondary | ICD-10-CM

## 2018-08-21 NOTE — Progress Notes (Signed)
  Subjective:     Patient ID: Elaine [name], female   DOB: 1941-08-12, 77 y.o.   MRN: [id number] Chief Complaint  Patient presents with  . Cough    since 08/13/18.  chest congestion with faint yellow mucus, blowing nose right much   HPI Reports clear sinus congestion and minimally productive cough. Has been taking ibuprofen for her sx. No fever or chills reported. Accompanied by her spouse.  Review of Systems     Objective:   Physical Exam Constitutional:      General: She is not in acute distress.    Appearance: She is ill-appearing.  Neurological:     Mental Status: She is alert.   Ears: T.M's intact without inflammation Sinuses: non-tender Throat: tonsils absent Neck: no cervical adenopathy Lungs: Brief inspiratory pops o/w clear     Assessment:    1. URI, acute    Plan:    Discussed use of Mucinex D and Delsym. To call in the next 2-3 days if not improving.

## 2018-08-21 NOTE — Patient Instructions (Signed)
Discussed use of Mucinex D for congestion and Delsym for cough. 

## 2018-08-27 ENCOUNTER — Encounter: Payer: Self-pay | Admitting: Family Medicine

## 2018-08-27 ENCOUNTER — Ambulatory Visit (INDEPENDENT_AMBULATORY_CARE_PROVIDER_SITE_OTHER): Payer: Medicare Other

## 2018-08-27 ENCOUNTER — Ambulatory Visit (INDEPENDENT_AMBULATORY_CARE_PROVIDER_SITE_OTHER): Payer: Medicare Other | Admitting: Family Medicine

## 2018-08-27 VITALS — BP 124/56 | HR 67 | Temp 97.9°F | Ht 63.0 in | Wt 141.6 lb

## 2018-08-27 DIAGNOSIS — E7800 Pure hypercholesterolemia, unspecified: Secondary | ICD-10-CM | POA: Diagnosis not present

## 2018-08-27 DIAGNOSIS — M810 Age-related osteoporosis without current pathological fracture: Secondary | ICD-10-CM | POA: Diagnosis not present

## 2018-08-27 DIAGNOSIS — Z130 Encounter for screening for diseases of the blood and blood-forming organs and certain disorders involving the immune mechanism: Secondary | ICD-10-CM

## 2018-08-27 DIAGNOSIS — F330 Major depressive disorder, recurrent, mild: Secondary | ICD-10-CM

## 2018-08-27 DIAGNOSIS — Z0000 Encounter for general adult medical examination without abnormal findings: Secondary | ICD-10-CM | POA: Diagnosis not present

## 2018-08-27 NOTE — Progress Notes (Signed)
Patient: Elaine [name], Female    DOB: Apr 11, 1941, 78 y.o.   MRN: [id number] Visit Date: 08/27/2018  Today's Provider: Lavon Paganini, MD   Chief Complaint  Patient presents with  . Annual Exam   Subjective:  I, Elaine [name], CMA, am acting as a scribe for Lavon Paganini, MD.   Patient had a AWE today prior to appointment   Complete Physical Katiria Lakela [name] is a 78 y.o. female. She feels fairly well. She reports exercising some, limited due to a fall in August and some upper respiratory symptoms. She reports she is sleeping well.  Osteoporosis - tried fosamax - side effects Does not want to repeat DEXA as she does not think it will change management  Does not want to do any further mammograms. -----------------------------------------------------------   Review of Systems  Constitutional: Positive for activity change and appetite change.  HENT: Positive for congestion, hearing loss and tinnitus.   Eyes: Positive for photophobia.  Respiratory: Positive for cough.   Cardiovascular: Negative.   Gastrointestinal: Positive for constipation and diarrhea.  Endocrine: Positive for cold intolerance and polyuria.  Genitourinary: Positive for enuresis.  Musculoskeletal: Positive for myalgias.  Skin: Negative.   Allergic/Immunologic: Negative.   Neurological: Negative.   Hematological: Negative.   Psychiatric/Behavioral: Negative.     Social History   Socioeconomic History  . Marital status: Widowed    Spouse name: Not on file  . Number of children: 2  . Years of education: H/S  . Highest education level: High school graduate  Occupational History  . Occupation: Retired    Comment: Psychologist, educational, Retail buyer  Social Needs  . Financial resource strain: Not hard at all  . Food insecurity:    Worry: Never true    Inability: Never true  . Transportation needs:    Medical: No    Non-medical: No  Tobacco Use  . Smoking status: Never  Smoker  . Smokeless tobacco: Never Used  Substance and Sexual Activity  . Alcohol use: Yes    Alcohol/week: 0.0 - 7.0 standard drinks    Comment: 1 drink daily  . Drug use: No  . Sexual activity: Not on file  Lifestyle  . Physical activity:    Days per week: 0 days    Minutes per session: 0 min  . Stress: Rather much  Relationships  . Social connections:    Talks on phone: Patient refused    Gets together: Patient refused    Attends religious service: Patient refused    Active member of club or organization: Patient refused    Attends meetings of clubs or organizations: Patient refused    Relationship status: Patient refused  . Intimate partner violence:    Fear of current or ex partner: Patient refused    Emotionally abused: Patient refused    Physically abused: Patient refused    Forced sexual activity: Patient refused  Other Topics Concern  . Not on file  Social History Narrative  . Not on file    Past Medical History:  Diagnosis Date  . Depression      Patient Active Problem List   Diagnosis Date Noted  . Healthcare maintenance 04/14/2017  . Hypercholesteremia 01/22/2016  . Insomnia 09/21/2015  . 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 Cannot sleep 05/28/2015  . Arthritis, degenerative 05/28/2015  . 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 Restless leg 05/28/2015  . Rotator cuff syndrome 07/03/2013  . 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 Acid reflux 06/08/2007  . Adjustment disorder with depressed mood 07/26/2005  . 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 ANKLE SURGERY    . AUGMENTATION MAMMAPLASTY Bilateral 2006  . BREAST BIOPSY Right 1984   EXCISIONAL - NEG  . BREAST ENHANCEMENT SURGERY Bilateral   . HEMORRHOID SURGERY  2011   Hemorrhoidectomy  . TONSILLECTOMY AND ADENOIDECTOMY    . TUBAL LIGATION      Her family  history includes Breast cancer in her maternal aunt; Heart attack in her father; Hypertension in her sister.      Current Outpatient Medications:  .  ALPRAZolam (XANAX) 0.25 MG tablet, Take 1 tablet (0.25 mg total) by mouth at bedtime as needed for anxiety., Disp: 30 tablet, Rfl: 0 .  aspirin EC 81 MG tablet, Take 81 mg by mouth daily., Disp: , Rfl:  .  enalapril (VASOTEC) 5 MG tablet, TAKE 1 TABLET BY MOUTH  EVERY DAY, Disp: 90 tablet, Rfl: 3 .  Ibuprofen 200 MG CAPS, Take 1 capsule by mouth as needed. , Disp: , Rfl:  .  sertraline (ZOLOFT) 50 MG tablet, TAKE 1 TABLET BY MOUTH  DAILY, Disp: 90 tablet, Rfl: 1  Patient Care Team: Virginia Crews, MD as PCP - General (Family Medicine) Mackie Pai, MD as Referring Physician (Cardiology) Pa, [place] as Consulting Physician (Optometry)     Objective:   Vitals: BP (!) 124/56 (BP Location: Right Arm, Patient Position: Sitting, Cuff Size: Normal)   Pulse 67   Temp 97.9 F (36.6 C) (Oral)   Ht 5\' 3"  (1.6 m)   Wt 141 lb 9.6 oz (64.2 kg)   BMI 25.08 kg/m   Physical Exam Vitals signs reviewed.  Constitutional:      General: She is not in acute distress.    Appearance: Normal appearance. She is well-developed. She is not diaphoretic.  HENT:     Head: Normocephalic and atraumatic.     Right Ear: Tympanic membrane, ear canal and external ear normal.     Left Ear: Tympanic membrane, ear canal and external ear normal.     Nose: Congestion present.     Mouth/Throat:     Mouth: Mucous membranes are moist.     Pharynx: Oropharynx is clear. No oropharyngeal exudate.  Eyes:     General: No scleral icterus.    Conjunctiva/sclera: Conjunctivae normal.     Pupils: Pupils are equal, round, and reactive to light.  Neck:     Musculoskeletal: Neck supple.     Thyroid: No thyromegaly.  Cardiovascular:     Rate and Rhythm: Normal rate and regular rhythm.     Pulses: Normal pulses.     Heart sounds: Normal heart sounds.  No murmur.  Pulmonary:     Effort: Pulmonary effort is normal. No respiratory distress.     Breath sounds: Normal breath sounds. No wheezing or rales.  Abdominal:     General: Bowel sounds are normal. There is no distension.     Palpations: Abdomen is soft.     Tenderness: There is no abdominal tenderness. There is no guarding or rebound.  Musculoskeletal:        General: No deformity.     Right lower leg: No edema.     Left lower leg: No edema.  Lymphadenopathy:     Cervical: No cervical adenopathy.  Skin:    General: Skin is warm and dry.  Capillary Refill: Capillary refill takes less than 2 seconds.     Findings: No rash.  Neurological:     Mental Status: She is alert and oriented to person, place, and time.  Psychiatric:        Mood and Affect: Mood normal.        Behavior: Behavior normal.        Thought Content: Thought content normal.     Activities of Daily Living In your present state of health, do you have any difficulty performing the following activities: 08/27/2018  Hearing? Y  Comment Wears bilateral hearing aids.   Vision? N  Comment Wears reader as needed.   Difficulty concentrating or making decisions? N  Walking or climbing stairs? N  Dressing or bathing? N  Doing errands, shopping? N  Preparing Food and eating ? N  Using the Toilet? N  In the past six months, have you accidently leaked urine? Y  Comment Occasionally, getting worse. Does not wear protection currently.   Do you have problems with loss of bowel control? Y  Comment Rarely, due to diarrhea.   Managing your Medications? N  Managing your Finances? N  Housekeeping or managing your Housekeeping? N  Some recent data might be hidden    Fall Risk Assessment Fall Risk  08/27/2018 08/21/2018 04/14/2017 08/02/2016 05/28/2015  Falls in the past year? 1 1 No No No  Number falls in past yr: 1 0 - - -  Injury with Fall? 1 1 - - -  Comment Compression fracture - - - -  Follow up Falls prevention  discussed - - - -     Depression Screen PHQ 2/9 Scores 08/27/2018 08/21/2018 04/14/2017 04/14/2017  PHQ - 2 Score 2 0 1 1  PHQ- 9 Score 4 - 3 -    6CIT Screen 08/02/2016  What Year? 0 points  What month? 0 points  What time? 0 points  Count back from 20 0 points  Months in reverse 0 points  Repeat phrase 2 points  Total Score 2      Assessment & Plan:    Annual Physical Reviewed patient's Family Medical History Reviewed and updated list of patient's medical providers Assessment of cognitive impairment was done Assessed patient's functional ability Established a written schedule for health screening [place] Completed and Reviewed  Exercise Activities and Dietary recommendations Goals    . Exercise 150 minutes per week (moderate activity)    . Increase water intake     Starting 08/02/16, I will increase my water intake to 4 glasses every day.    [facility] LIFESTYLE - DECREASE FALLS RISK     Recommend to remove any items from the home that may cause slips or trips.        Immunization History  Administered Date(s) Administered  . Influenza,inj,Quad PF,6+ Mos 08/28/2015  . Influenza,inj,quad, With Preservative 06/14/2017  . Pneumococcal Conjugate-13 05/28/2014  . Pneumococcal Polysaccharide-23 08/28/2015  . Zoster 11/03/2010    Health Maintenance  Topic Date Due  . Samul Dada  03/08/1960  . DEXA SCAN  04/30/2017  . INFLUENZA VACCINE  Completed  . PNA vac Low Risk Adult  Completed     Discussed health benefits of physical activity, and encouraged her to engage in regular exercise appropriate for her age and condition.    ------------------------------------------------------------------------------------------------------------  Problem List Items Addressed This Visit      Musculoskeletal and Integument   Osteoporosis, post-menopausal    Intolerance to bisphosphonates Not interested in  trying other medications Declines repeat  screening/follow-up DEXA        Other   Major depressive disorder    Well controlled Continue Zoloft 50mg  daily Repeat PHQ9 in 1 yr at [place]      Hypercholesteremia    Patient has previously declined statin therapy Repeat lipid panel      Relevant Orders   Lipid panel   Comprehensive metabolic panel    Other Visit Diagnoses    Encounter for annual physical exam    -  Primary   Relevant Orders   Lipid panel   Comprehensive metabolic panel   CBC w/Diff/Platelet   Screening for deficiency anemia       Relevant Orders   CBC w/Diff/Platelet       Return in about 1 year (around 08/28/2019) for AWV/CPE.   The entirety of the information documented in the History of Present Illness, Review of Systems and Physical Exam were personally obtained by me. Portions of this information were initially documented by Elaine [name], CMA and reviewed by me for thoroughness and accuracy.    Virginia Crews, MD, MPH [facility] 08/27/2018 12:32 PM

## 2018-08-27 NOTE — Progress Notes (Signed)
Subjective:   Elaine [name] is a 78 y.o. female who presents for Medicare Annual (Subsequent) preventive examination.  Review of Systems:  N/A  Cardiac Risk Factors include: advanced age (>49men, >63 women)     Objective:     Vitals: BP (!) 124/56 (BP Location: Right Arm)   Pulse 67   Temp 97.9 F (36.6 C) (Oral)   Ht 5\' 3"  (1.6 m)   Wt 141 lb 9.6 oz (64.2 kg)   BMI 25.08 kg/m   Body mass index is 25.08 kg/m.  Advanced Directives 08/27/2018 04/14/2017 08/02/2016 01/22/2016 12/09/2015 05/28/2015  Does Patient Have a Medical Advance Directive? No No Yes;No No No No  Does patient want to make changes to medical advance directive? - - No - Patient declined - - -  Would patient like information on creating a medical advance directive? Yes (MAU/Ambulatory/Procedural Areas - Information given) Yes (ED - Information included in AVS) - - - -    Tobacco Social History   Tobacco Use  Smoking Status Never Smoker  Smokeless Tobacco Never Used     Counseling given: Not Answered   Clinical Intake:  Pre-visit preparation completed: Yes  Pain : No/denies pain Pain Score: 0-No pain     Nutritional Status: BMI 25 -29 Overweight Nutritional Risks: Nausea/ vomitting/ diarrhea(Diarrhea intermittenly- unsure of cause. Will f/u on this with PCP today. ) Diabetes: No  How often do you need to have someone help you when you read instructions, pamphlets, or other written materials from your doctor or pharmacy?: 1 - Never  Interpreter Needed?: No  Information entered by :: [facility], LPN  Past Medical History:  Diagnosis Date  . Depression    Past Surgical History:  Procedure Laterality Date  . ANKLE SURGERY    . AUGMENTATION MAMMAPLASTY Bilateral 2006  . BREAST BIOPSY Right 1984   EXCISIONAL - NEG  . BREAST ENHANCEMENT SURGERY Bilateral   . HEMORRHOID SURGERY  2011   Hemorrhoidectomy  . TONSILLECTOMY AND ADENOIDECTOMY    . TUBAL LIGATION     Family History  Problem  Relation Age of Onset  . Heart attack Father   . Hypertension Sister        pulmonary HTN  . Breast cancer Maternal Aunt    Social History   Socioeconomic History  . Marital status: Widowed    Spouse name: Not on file  . Number of children: 2  . Years of education: H/S  . Highest education level: High school graduate  Occupational History  . Occupation: Retired    Comment: Psychologist, educational, Retail buyer  Social Needs  . Financial resource strain: Not hard at all  . Food insecurity:    Worry: Never true    Inability: Never true  . Transportation needs:    Medical: No    Non-medical: No  Tobacco Use  . Smoking status: Never Smoker  . Smokeless tobacco: Never Used  Substance and Sexual Activity  . Alcohol use: Yes    Alcohol/week: 0.0 - 7.0 standard drinks    Comment: 1 drink daily  . Drug use: No  . Sexual activity: Not on file  Lifestyle  . Physical activity:    Days per week: 0 days    Minutes per session: 0 min  . Stress: Rather much  Relationships  . Social connections:    Talks on phone: Patient refused    Gets together: Patient refused    Attends religious service: Patient refused    Active member  of club or organization: Patient refused    Attends meetings of clubs or organizations: Patient refused    Relationship status: Patient refused  Other Topics Concern  . Not on file  Social History Narrative  . Not on file    Outpatient Encounter Medications as of 08/27/2018  Medication Sig  . ALPRAZolam (XANAX) 0.25 MG tablet Take 1 tablet (0.25 mg total) by mouth at bedtime as needed for anxiety.  Marland Kitchen aspirin EC 81 MG tablet Take 81 mg by mouth daily.  . enalapril (VASOTEC) 5 MG tablet TAKE 1 TABLET BY MOUTH  EVERY DAY  . Ibuprofen 200 MG CAPS Take 1 capsule by mouth as needed.   . sertraline (ZOLOFT) 50 MG tablet TAKE 1 TABLET BY MOUTH  DAILY   No facility-administered encounter medications on file as of 08/27/2018.     Activities of Daily Living In  your present state of health, do you have any difficulty performing the following activities: 08/27/2018  Hearing? Y  Comment Wears bilateral hearing aids.   Vision? N  Comment Wears reader as needed.   Difficulty concentrating or making decisions? N  Walking or climbing stairs? N  Dressing or bathing? N  Doing errands, shopping? N  Preparing Food and eating ? N  Using the Toilet? N  In the past six months, have you accidently leaked urine? Y  Comment Occasionally, getting worse. Does not wear protection currently.   Do you have problems with loss of bowel control? Y  Comment Rarely, due to diarrhea.   Managing your Medications? N  Managing your Finances? N  Housekeeping or managing your Housekeeping? N  Some recent data might be hidden    Patient Care Team: Virginia Crews, MD as PCP - General (Family Medicine) Michaelle Birks, Scarlette Ar, MD as Referring Physician (Cardiology) Pa, Dickens as Consulting Physician [facility])    Assessment:   This is a routine wellness examination for Elaine [name].  Exercise Activities and Dietary recommendations Current Exercise Habits: The patient does not participate in regular exercise at present, Exercise limited by: orthopedic condition(s)  Goals    . Exercise 150 minutes per week (moderate activity)    . Increase water intake     Starting 08/02/16, I will increase my water intake to 4 glasses every day.    Marland Kitchen LIFESTYLE - DECREASE FALLS RISK     Recommend to remove any items from the home that may cause slips or trips.        Fall Risk Fall Risk  08/27/2018 08/21/2018 04/14/2017 08/02/2016 05/28/2015  Falls in the past year? 1 1 No No No  Number falls in past yr: 1 0 - - -  Injury with Fall? 1 1 - - -  Comment Compression fracture - - - -  Follow up Falls prevention discussed - - - -   FALL RISK PREVENTION PERTAINING TO THE HOME:  Any stairs in or around the home WITH handrails? Yes  Home free of loose throw rugs in  walkways, pet beds, electrical cords, etc? Yes  Adequate lighting in your home to reduce risk of falls? Yes   ASSISTIVE DEVICES UTILIZED TO PREVENT FALLS:  Life alert? No  Use of a cane, walker or w/c? No  Grab bars in the bathroom? No  Shower chair or bench in shower? No  Elevated toilet seat or a handicapped toilet? No    TIMED UP AND GO:  Was the test performed? No .     Depression Screen  PHQ 2/9 Scores 08/27/2018 08/21/2018 04/14/2017 04/14/2017  PHQ - 2 Score 2 0 1 1  PHQ- 9 Score 4 - 3 -     Cognitive Function: Declined screening today.      6CIT Screen 08/02/2016  What Year? 0 points  What month? 0 points  What time? 0 points  Count back from 20 0 points  Months in reverse 0 points  Repeat phrase 2 points  Total Score 2    Immunization History  Administered Date(s) Administered  . Influenza,inj,Quad PF,6+ Mos 08/28/2015  . Influenza,inj,quad, With Preservative 06/14/2017  . Pneumococcal Conjugate-13 05/28/2014  . Pneumococcal Polysaccharide-23 08/28/2015  . Zoster 11/03/2010    Qualifies for Shingles Vaccine? Yes  Zostavax completed 11/03/10. Due for Shingrix. Education has been provided regarding the importance of this vaccine. Pt has been advised to call insurance company to determine out of pocket expense. Advised may also receive vaccine at local pharmacy or Health Dept. Verbalized acceptance and understanding.  Tdap: Although this vaccine is not a covered service during a Wellness Exam, does the patient still wish to receive this vaccine today?  No .  Education has been provided regarding the importance of this vaccine. Advised may receive this vaccine at local pharmacy or Health Dept. Aware to provide a copy of the vaccination record if obtained from local pharmacy or Health Dept. Verbalized acceptance and understanding.  Flu Vaccine: Up to date  Pneumococcal Vaccine: Up to date   Screening Tests Health Maintenance  Topic Date Due  . TETANUS/TDAP   03/08/1960  . DEXA SCAN  04/30/2017  . INFLUENZA VACCINE  Completed  . PNA vac Low Risk Adult  Completed    Cancer Screenings:  Colorectal Screening: No longer required.   Mammogram: No longer required.   Bone Density: Completed 04/30/14. Results reflect OSTEOPENIA.Marland Kitchen Repeat every 3 years. Pt declined referral at this time.  Lung Cancer Screening: (Low Dose CT Chest recommended if Age 24-80 years, 30 pack-year currently smoking OR have quit w/in 15years.) does not qualify.    Additional Screening:  Vision Screening: Recommended annual ophthalmology exams for early detection of glaucoma and other disorders of the eye.  Dental Screening: Recommended annual dental exams for proper oral hygiene  Community Resource Referral:  CRR required this visit?  No       Plan:  I have personally reviewed and addressed the Medicare Annual Wellness questionnaire and have noted the following in the patient's chart:  A. Medical and social history B. Use of alcohol, tobacco or illicit drugs  C. Current medications and supplements D. Functional ability and status E.  Nutritional status F.  Physical activity G. Advance directives H. List of other physicians I.  Hospitalizations, surgeries, and ER visits in previous 12 months J.  [place] such as hearing and vision if needed, cognitive and depression L. Referrals and appointments - none  In addition, I have reviewed and discussed with patient certain preventive protocols, quality metrics, and best practice recommendations. A written personalized care plan for preventive services as well as general preventive health recommendations were provided to patient.  See attached scanned questionnaire for additional information.   Signed,  Fabio Neighbors, LPN Nurse Health Advisor   Nurse Recommendations: Pt declined the tetanus vaccine and DEXA referral today. Please f/u on intermittent diarrhea concern.

## 2018-08-27 NOTE — Assessment & Plan Note (Signed)
Intolerance to bisphosphonates Not interested in trying other medications Declines repeat screening/follow-up DEXA

## 2018-08-27 NOTE — Patient Instructions (Addendum)
The CDC recommends two doses of Shingrix (the shingles vaccine) separated by 2 to 6 months for adults age 79 years and older. I recommend checking with your insurance plan regarding coverage for this vaccine.   Also ask about coverage of TDAP (tetanus vaccine) - may need to get at the pharmacy   Preventive Care 65 Years and Older, Female Preventive care refers to lifestyle choices and visits with your health care provider that can promote health and wellness. What does preventive care include?  A yearly physical exam. This is also called an annual well check.  Dental exams once or twice a year.  Routine eye exams. Ask your health care provider how often you should have your eyes checked.  Personal lifestyle choices, including: ? Daily care of your teeth and gums. ? Regular physical activity. ? Eating a healthy diet. ? Avoiding tobacco and drug use. ? Limiting alcohol use. ? Practicing safe sex. ? Taking low-dose aspirin every day. ? Taking vitamin and mineral supplements as recommended by your health care provider. What happens during an annual well check? The services and screenings done by your health care provider during your annual well check will depend on your age, overall health, lifestyle risk factors, and family history of disease. Counseling Your health care provider may ask you questions about your:  Alcohol use.  Tobacco use.  Drug use.  Emotional well-being.  Home and relationship well-being.  Sexual activity.  Eating habits.  History of falls.  Memory and ability to understand (cognition).  Work and work Statistician.  Reproductive health.  Screening You may have the following tests or measurements:  Height, weight, and BMI.  Blood pressure.  Lipid and cholesterol levels. These may be checked every 5 years, or more frequently if you are over 44 years old.  Skin check.  Lung cancer screening. You may have this screening every year starting  at age 75 if you have a 30-pack-year history of smoking and currently smoke or have quit within the past 15 years.  Colorectal cancer screening. All adults should have this screening starting at age 20 and continuing until age 88. You will have tests every 1-10 years, depending on your results and the type of screening test. People at increased risk should start screening at an earlier age. Screening tests may include: ? Guaiac-based fecal occult blood testing. ? Fecal immunochemical test (FIT). ? Stool DNA test. ? Virtual colonoscopy. ? Sigmoidoscopy. During this test, a flexible tube with a tiny camera (sigmoidoscope) is used to examine your rectum and lower colon. The sigmoidoscope is inserted through your anus into your rectum and lower colon. ? Colonoscopy. During this test, a long, thin, flexible tube with a tiny camera (colonoscope) is used to examine your entire colon and rectum.  Hepatitis C blood test.  Hepatitis B blood test.  Sexually transmitted disease (STD) testing.  Diabetes screening. This is done by checking your blood sugar (glucose) after you have not eaten for a while (fasting). You may have this done every 1-3 years.  Bone density scan. This is done to screen for osteoporosis. You may have this done starting at age 55.  Mammogram. This may be done every 1-2 years. Talk to your health care provider about how often you should have regular mammograms. Talk with your health care provider about your test results, treatment options, and if necessary, the need for more tests. Vaccines Your health care provider may recommend certain vaccines, such as:  Influenza vaccine. This is  recommended every year.  Tetanus, diphtheria, and acellular pertussis (Tdap, Td) vaccine. You may need a Td booster every 10 years.  Varicella vaccine. You may need this if you have not been vaccinated.  Zoster vaccine. You may need this after age 29.  Measles, mumps, and rubella (MMR) vaccine.  You may need at least one dose of MMR if you were born in 1957 or later. You may also need a second dose.  Pneumococcal 13-valent conjugate (PCV13) vaccine. One dose is recommended after age 9.  Pneumococcal polysaccharide (PPSV23) vaccine. One dose is recommended after age 72.  Meningococcal vaccine. You may need this if you have certain conditions.  Hepatitis A vaccine. You may need this if you have certain conditions or if you travel or work in places where you may be exposed to hepatitis A.  Hepatitis B vaccine. You may need this if you have certain conditions or if you travel or work in places where you may be exposed to hepatitis B.  Haemophilus influenzae type b (Hib) vaccine. You may need this if you have certain conditions. Talk to your health care provider about which screenings and vaccines you need and how often you need them. This information is not intended to replace advice given to you by your health care provider. Make sure you discuss any questions you have with your health care provider. Document Released: 09/04/2015 Document Revised: 09/28/2017 Document Reviewed: 06/09/2015 Elsevier Interactive Patient Education  2019 [employer].

## 2018-08-27 NOTE — Assessment & Plan Note (Signed)
Well controlled Continue Zoloft 50mg  daily Repeat PHQ9 in 1 yr at [place]

## 2018-08-27 NOTE — Assessment & Plan Note (Signed)
Patient has previously declined statin therapy Repeat lipid panel

## 2018-08-27 NOTE — Patient Instructions (Signed)
Elaine [name] , Thank you for taking time to come for your Medicare Wellness Visit. I appreciate your ongoing commitment to your health goals. Please review the following plan we discussed and let me know if I can assist you in the future.   Screening recommendations/referrals: Colonoscopy: No longer required.  Mammogram: No longer required.  Bone Density: Declined referral today. Recommended yearly ophthalmology/optometry visit for glaucoma screening and checkup Recommended yearly dental visit for hygiene and checkup  Vaccinations: Influenza vaccine: Up to date Pneumococcal vaccine: Completed series Tdap vaccine: Pt declines today.  Shingles vaccine: Pt declines today.     Advanced directives: Advance directive discussed with you today. I have provided a copy for you to complete at home and have notarized. Once this is complete please bring a copy in to our office so we can scan it into your chart.  Conditions/risks identified: Fall risk prevention; Recommend to increase exercise to 3 times a week for at least 30 minutes.   Next appointment: 10:00 AM today with Dr Brita Romp.    Preventive Care 67 Years and Older, Female Preventive care refers to lifestyle choices and visits with your health care provider that can promote health and wellness. What does preventive care include?  A yearly physical exam. This is also called an annual well check.  Dental exams once or twice a year.  Routine eye exams. Ask your health care provider how often you should have your eyes checked.  Personal lifestyle choices, including:  Daily care of your teeth and gums.  Regular physical activity.  Eating a healthy diet.  Avoiding tobacco and drug use.  Limiting alcohol use.  Practicing safe sex.  Taking low-dose aspirin every day.  Taking vitamin and mineral supplements as recommended by your health care provider. What happens during an annual well check? The services and screenings done by  your health care provider during your annual well check will depend on your age, overall health, lifestyle risk factors, and family history of disease. Counseling  Your health care provider may ask you questions about your:  Alcohol use.  Tobacco use.  Drug use.  Emotional well-being.  Home and relationship well-being.  Sexual activity.  Eating habits.  History of falls.  Memory and ability to understand (cognition).  Work and work Statistician.  Reproductive health. Screening  You may have the following tests or measurements:  Height, weight, and BMI.  Blood pressure.  Lipid and cholesterol levels. These may be checked every 5 years, or more frequently if you are over 18 years old.  Skin check.  Lung cancer screening. You may have this screening every year starting at age 20 if you have a 30-pack-year history of smoking and currently smoke or have quit within the past 15 years.  Fecal occult blood test (FOBT) of the stool. You may have this test every year starting at age 40.  Flexible sigmoidoscopy or colonoscopy. You may have a sigmoidoscopy every 5 years or a colonoscopy every 10 years starting at age 73.  Hepatitis C blood test.  Hepatitis B blood test.  Sexually transmitted disease (STD) testing.  Diabetes screening. This is done by checking your blood sugar (glucose) after you have not eaten for a while (fasting). You may have this done every 1-3 years.  Bone density scan. This is done to screen for osteoporosis. You may have this done starting at age 1.  Mammogram. This may be done every 1-2 years. Talk to your health care provider about how often you  should have regular mammograms. Talk with your health care provider about your test results, treatment options, and if necessary, the need for more tests. Vaccines  Your health care provider may recommend certain vaccines, such as:  Influenza vaccine. This is recommended every year.  Tetanus,  diphtheria, and acellular pertussis (Tdap, Td) vaccine. You may need a Td booster every 10 years.  Zoster vaccine. You may need this after age 46.  Pneumococcal 13-valent conjugate (PCV13) vaccine. One dose is recommended after age 29.  Pneumococcal polysaccharide (PPSV23) vaccine. One dose is recommended after age 65. Talk to your health care provider about which screenings and vaccines you need and how often you need them. This information is not intended to replace advice given to you by your health care provider. Make sure you discuss any questions you have with your health care provider. Document Released: 09/04/2015 Document Revised: 04/27/2016 Document Reviewed: 06/09/2015 Elsevier Interactive Patient Education  2017 [place] Prevention in the Home Falls can cause injuries. They can happen to people of all ages. There are many things you can do to make your home safe and to help prevent falls. What can I do on the outside of my home?  Regularly fix the edges of walkways and driveways and fix any cracks.  Remove anything that might make you trip as you walk through a door, such as a raised step or threshold.  Trim any bushes or trees on the path to your home.  Use bright outdoor lighting.  Clear any walking paths of anything that might make someone trip, such as rocks or tools.  Regularly check to see if handrails are loose or broken. Make sure that both sides of any steps have handrails.  Any raised decks and porches should have guardrails on the edges.  Have any leaves, snow, or ice cleared regularly.  Use sand or salt on walking paths during winter.  Clean up any spills in your garage right away. This includes oil or grease spills. What can I do in the bathroom?  Use night lights.  Install grab bars by the toilet and in the tub and shower. Do not use towel bars as grab bars.  Use non-skid mats or decals in the tub or shower.  If you need to sit down in  the shower, use a plastic, non-slip stool.  Keep the floor dry. Clean up any water that spills on the floor as soon as it happens.  Remove soap buildup in the tub or shower regularly.  Attach bath mats securely with double-sided non-slip rug tape.  Do not have throw rugs and other things on the floor that can make you trip. What can I do in the bedroom?  Use night lights.  Make sure that you have a light by your bed that is easy to reach.  Do not use any sheets or blankets that are too big for your bed. They should not hang down onto the floor.  Have a firm chair that has side arms. You can use this for support while you get dressed.  Do not have throw rugs and other things on the floor that can make you trip. What can I do in the kitchen?  Clean up any spills right away.  Avoid walking on wet floors.  Keep items that you use a lot in easy-to-reach places.  If you need to reach something above you, use a strong step stool that has a grab bar.  Keep electrical cords out  of the way.  Do not use floor polish or wax that makes floors slippery. If you must use wax, use non-skid floor wax.  Do not have throw rugs and other things on the floor that can make you trip. What can I do with my stairs?  Do not leave any items on the stairs.  Make sure that there are handrails on both sides of the stairs and use them. Fix handrails that are broken or loose. Make sure that handrails are as long as the stairways.  Check any carpeting to make sure that it is firmly attached to the stairs. Fix any carpet that is loose or worn.  Avoid having throw rugs at the top or bottom of the stairs. If you do have throw rugs, attach them to the floor with carpet tape.  Make sure that you have a light switch at the top of the stairs and the bottom of the stairs. If you do not have them, ask someone to add them for you. What else can I do to help prevent falls?  Wear shoes that:  Do not have high  heels.  Have rubber bottoms.  Are comfortable and fit you well.  Are closed at the toe. Do not wear sandals.  If you use a stepladder:  Make sure that it is fully opened. Do not climb a closed stepladder.  Make sure that both sides of the stepladder are locked into place.  Ask someone to hold it for you, if possible.  Clearly mark and make sure that you can see:  Any grab bars or handrails.  First and last steps.  Where the edge of each step is.  Use tools that help you move around (mobility aids) if they are needed. These include:  Canes.  Walkers.  Scooters.  Crutches.  Turn on the lights when you go into a dark area. Replace any light bulbs as soon as they burn out.  Set up your furniture so you have a clear path. Avoid moving your furniture around.  If any of your floors are uneven, fix them.  If there are any pets around you, be aware of where they are.  Review your medicines with your doctor. Some medicines can make you feel dizzy. This can increase your chance of falling. Ask your doctor what other things that you can do to help prevent falls. This information is not intended to replace advice given to you by your health care provider. Make sure you discuss any questions you have with your health care provider. Document Released: 06/04/2009 Document Revised: 01/14/2016 Document Reviewed: 09/12/2014 Elsevier Interactive Patient Education  2017 [employer].

## 2018-08-28 LAB — LIPID PANEL
CHOLESTEROL TOTAL: 157 mg/dL (ref 100–199)
Chol/HDL Ratio: 5.6 ratio — ABNORMAL HIGH (ref 0.0–4.4)
HDL: 28 mg/dL — ABNORMAL LOW (ref >39)
LDL Calculated: 112 mg/dL — ABNORMAL HIGH (ref 0–99)
TRIGLYCERIDES: 86 mg/dL (ref 0–149)
VLDL Cholesterol Cal: 17 mg/dL (ref 5–40)

## 2018-08-28 LAB — COMPREHENSIVE METABOLIC PANEL
ALT: 13 IU/L (ref 0–32)
AST: 21 IU/L (ref 0–40)
Albumin/Globulin Ratio: 1.4 (ref 1.2–2.2)
Albumin: 3.9 g/dL (ref 3.5–4.8)
Alkaline Phosphatase: 99 IU/L (ref 39–117)
BILIRUBIN TOTAL: 0.3 mg/dL (ref 0.0–1.2)
BUN / CREAT RATIO: 18 (ref 12–28)
BUN: 11 mg/dL (ref 8–27)
CALCIUM: 9.1 mg/dL (ref 8.7–10.3)
CO2: 22 mmol/L (ref 20–29)
CREATININE: 0.62 mg/dL (ref 0.57–1.00)
Chloride: 102 mmol/L (ref 96–106)
GFR calc non Af Amer: 87 mL/min/{1.73_m2} (ref >59)
GFR, EST AFRICAN AMERICAN: 100 mL/min/{1.73_m2} (ref >59)
GLOBULIN, TOTAL: 2.7 g/dL (ref 1.5–4.5)
Glucose: 90 mg/dL (ref 65–99)
Potassium: 4.2 mmol/L (ref 3.5–5.2)
Sodium: 139 mmol/L (ref 134–144)
TOTAL PROTEIN: 6.6 g/dL (ref 6.0–8.5)

## 2018-08-28 LAB — CBC WITH DIFFERENTIAL/PLATELET
Basophils Absolute: 0 10*3/uL (ref 0.0–0.2)
Basos: 1 %
EOS (ABSOLUTE): 0.1 10*3/uL (ref 0.0–0.4)
EOS: 2 %
HEMATOCRIT: 36.8 % (ref 34.0–46.6)
HEMOGLOBIN: 12.5 g/dL (ref 11.1–15.9)
Immature Grans (Abs): 0 10*3/uL (ref 0.0–0.1)
Immature Granulocytes: 1 %
LYMPHS ABS: 1.1 10*3/uL (ref 0.7–3.1)
Lymphs: 20 %
MCH: 31.1 pg (ref 26.6–33.0)
MCHC: 34 g/dL (ref 31.5–35.7)
MCV: 92 fL (ref 79–97)
MONOCYTES: 8 %
Monocytes Absolute: 0.4 10*3/uL (ref 0.1–0.9)
NEUTROS ABS: 3.7 10*3/uL (ref 1.4–7.0)
Neutrophils: 68 %
Platelets: 305 10*3/uL (ref 150–450)
RBC: 4.02 x10E6/uL (ref 3.77–5.28)
RDW: 11.8 % (ref 11.7–15.4)
WBC: 5.4 10*3/uL (ref 3.4–10.8)

## 2018-08-31 DIAGNOSIS — S32010S Wedge compression fracture of first lumbar vertebra, sequela: Secondary | ICD-10-CM | POA: Diagnosis not present

## 2018-08-31 DIAGNOSIS — S32010A Wedge compression fracture of first lumbar vertebra, initial encounter for closed fracture: Secondary | ICD-10-CM | POA: Diagnosis not present

## 2018-08-31 DIAGNOSIS — M810 Age-related osteoporosis without current pathological fracture: Secondary | ICD-10-CM | POA: Diagnosis not present

## 2018-08-31 DIAGNOSIS — M8008XA Age-related osteoporosis with current pathological fracture, vertebra(e), initial encounter for fracture: Secondary | ICD-10-CM | POA: Diagnosis not present

## 2019-01-03 ENCOUNTER — Other Ambulatory Visit: Payer: Self-pay | Admitting: Family Medicine

## 2019-01-03 DIAGNOSIS — F339 Major depressive disorder, recurrent, unspecified: Secondary | ICD-10-CM

## 2019-01-18 ENCOUNTER — Other Ambulatory Visit: Payer: Self-pay

## 2019-01-18 DIAGNOSIS — G4700 Insomnia, unspecified: Secondary | ICD-10-CM

## 2019-01-18 NOTE — Telephone Encounter (Signed)
Patient is requesting a refill on Alprazolam 

## 2019-01-18 NOTE — Telephone Encounter (Signed)
This has not been prescribed since 2018.  She will have to have OV to discuss this and underlying problem (anxiety/sleep?)

## 2019-01-21 NOTE — Telephone Encounter (Signed)
Patient scheduled for 01/23/2019

## 2019-01-23 ENCOUNTER — Ambulatory Visit (INDEPENDENT_AMBULATORY_CARE_PROVIDER_SITE_OTHER): Payer: Medicare Other | Admitting: Family Medicine

## 2019-01-23 ENCOUNTER — Encounter: Payer: Self-pay | Admitting: Family Medicine

## 2019-01-23 DIAGNOSIS — F3342 Major depressive disorder, recurrent, in full remission: Secondary | ICD-10-CM | POA: Diagnosis not present

## 2019-01-23 DIAGNOSIS — G4700 Insomnia, unspecified: Secondary | ICD-10-CM

## 2019-01-23 MED ORDER — ALPRAZOLAM 0.25 MG PO TABS: 0.2500 mg | ORAL_TABLET | Freq: Every evening | ORAL | 0 refills | Status: DC | PRN

## 2019-01-23 NOTE — Assessment & Plan Note (Signed)
Chronic and well controlled Advised on risks of benzos in the elderly Taking Xanax low-dose very rarely (took 2 yrs to finish 30 pills from last Rx) Will refill today

## 2019-01-23 NOTE — Progress Notes (Signed)
Patient: Elaine [name] Female    DOB: Jan 01, 1941   78 y.o.   MRN: [id number] Visit Date: 01/23/2019  Today's Provider: Lavon Paganini, MD   Chief Complaint  Patient presents with  . Depression   Subjective:    I, Porsha McClurkin CMA, am acting as a scribe for Lavon Paganini, MD.   Virtual Visit via Video Note  I connected with Weber Cooks on 01/23/19 at 10:40 AM EDT by a video enabled telemedicine application and verified that I am speaking with the correct person using two identifiers.   Patient location: home Provider location: [place] involved in the visit: patient, provider   I discussed the limitations of evaluation and management by telemedicine and the availability of in person appointments. The patient expressed understanding and agreed to proceed.   HPI  Depression Patient presents today for depression follow-up. Patient reports good compliance with treatment. Patient is currently taking Zoloft 50 MG daily and reports no side effects from the medication.  She was last Rx'd Xanax in 2018.  She takes it very rarely.  It helps when she has difficulty calming down and relaxing.  It also helps her sleep  States last few months have ben more difficult due to pandemic  Depression screen [facility] 2/9 01/23/2019 08/27/2018 08/21/2018 04/14/2017 04/14/2017  Decreased Interest 2 1 0 1 1  Down, Depressed, Hopeless 0 1 0 0 0  PHQ - 2 Score 2 2 0 1 1  Altered sleeping 1 1 - 1 -  Tired, decreased energy 1 1 - 1 -  Change in appetite 0 0 - 0 -  Feeling bad or failure about yourself  0 0 - 0 -  Trouble concentrating 1 0 - 0 -  Moving slowly or fidgety/restless 0 0 - 0 -  Suicidal thoughts 0 0 - 0 -  PHQ-9 Score 5 4 - 3 -  Difficult doing work/chores Not difficult at all Not difficult at all - Not difficult at all -     GAD 7 : Generalized Anxiety Score 01/23/2019  Nervous, Anxious, on Edge 0  Control/stop worrying 1  Worry too much -  different things 1  Trouble relaxing 0  Restless 0  Easily annoyed or irritable 1  Afraid - awful might happen 0  Total GAD 7 Score 3  Anxiety Difficulty Not difficult at all       No Known Allergies   Current Outpatient Medications:  .  ALPRAZolam (XANAX) 0.25 MG tablet, Take 1 tablet (0.25 mg total) by mouth at bedtime as needed for anxiety or sleep., Disp: 30 tablet, Rfl: 0 .  aspirin EC 81 MG tablet, Take 81 mg by mouth daily., Disp: , Rfl:  .  enalapril (VASOTEC) 5 MG tablet, TAKE 1 TABLET BY MOUTH  EVERY DAY, Disp: 90 tablet, Rfl: 3 .  Ibuprofen 200 MG CAPS, Take 1 capsule by mouth as needed. , Disp: , Rfl:  .  sertraline (ZOLOFT) 50 MG tablet, TAKE 1 TABLET BY MOUTH  DAILY, Disp: 90 tablet, Rfl: 1  Review of Systems  Constitutional: Negative.   Respiratory: Negative.   Genitourinary: Negative.   Hematological: Negative.     Social History   Tobacco Use  . Smoking status: Never Smoker  . Smokeless tobacco: Never Used  Substance Use Topics  . Alcohol use: Yes    Alcohol/week: 0.0 - 7.0 standard drinks    Comment: 1 drink daily      Objective:  There were no vitals taken for this visit. There were no vitals filed for this visit.   Physical Exam Constitutional:      General: She is not in acute distress.    Appearance: Normal appearance.  Pulmonary:     Effort: Pulmonary effort is normal. No respiratory distress.  Neurological:     Mental Status: She is alert and oriented to person, place, and time. Mental status is at baseline.  Psychiatric:        Mood and Affect: Mood normal.        Behavior: Behavior normal.         Assessment & Plan  Follow Up Instructions: I discussed the assessment and treatment plan with the patient. The patient was provided an opportunity to ask questions and all were answered. The patient agreed with the plan and demonstrated an understanding of the instructions.   The patient was advised to call back or seek an in-person  evaluation if the symptoms worsen or if the condition fails to improve as anticipated.  Problem List Items Addressed This Visit      Other   Major depressive disorder - Primary    Well controlled Continue zoloft 50mg  daily No SI/HI F/u at next CPE      Relevant Medications   ALPRAZolam (XANAX) 0.25 MG tablet   Insomnia    Chronic and well controlled Advised on risks of benzos in the elderly Taking Xanax low-dose very rarely (took 2 yrs to finish 30 pills from last Rx) Will refill today      Relevant Medications   ALPRAZolam (XANAX) 0.25 MG tablet       Return if symptoms worsen or fail to improve.   The entirety of the information documented in the History of Present Illness, Review of Systems and Physical Exam were personally obtained by me. Portions of this information were initially documented by [facility], CMA and reviewed by me for thoroughness and accuracy.    Bacigalupo, Dionne Bucy, MD MPH [facility]

## 2019-01-23 NOTE — Assessment & Plan Note (Signed)
Well controlled Continue zoloft 50mg  daily No SI/HI F/u at next CPE

## 2019-05-07 NOTE — Progress Notes (Signed)
Patient: Elaine [name] Female    DOB: 1941/02/10   78 y.o.   MRN: [id number] Visit Date: 05/08/2019  Today's Provider: Lavon Paganini, MD   Chief Complaint  Patient presents with  . Memory Loss   Subjective:    HPI     Memory Loss Patient presents today for memory loss. Patient states for the past few weeks her memory has declined but states her friend told her it has been longer. When contacting the patient about her visit patient stated that she has not been to the office in over 1 year but in patient's chart she was seen back in January, 2020 & via virtual visit in 01/2019. Patient is very concerned about losing her memory.  Last week, she had an episode where she felt "fuzzy" in her head.  She was going to have her hearing aids adjusted and she has been there before.  When she arrived, she questioned herself whether she was actually supposed to be at the eye doctor and she drove over there, but then she thought that she actually was supposed to be at the audiologist and drove back.  She ended up missing her appointment and has another one scheduled for next week.  She was concerned by this because this is never happened before.  Has trouble falling asleep. Has taken ibuprofen PM before to help.  She had taken 1 dose of Xanax the night before she had the episode last week.  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  She has had some stress recently with a female friend of hers moving in with her and them disagreeing on different things about the home and scheduling.   6CIT Screen 08/02/2016  What Year? 0 points  What month? 0 points  What time? 0 points  Count back from 20 0 points  Months in reverse 0 points  Repeat phrase 2 points   Total Score 2    MMSE - Mini Mental State Exam 05/08/2019  Orientation to time 3  Orientation to Place 4  Registration 3  Attention/ Calculation 5  Recall 1  Language- name 2 objects 2  Language- repeat 1  Language- follow 3 step command 3  Language- read & follow direction 1  Write a sentence 1  Copy design 1  Total score 25   Depression screen [facility] 2/9 05/08/2019 01/23/2019 08/27/2018 08/21/2018 04/14/2017  Decreased Interest 1 2 1  0 1  Down, Depressed, Hopeless 2 0 1 0 0  PHQ - 2 Score 3 2 2  0 1  Altered sleeping 0 1 1 - 1  Tired, decreased energy 1 1 1  - 1  Change in appetite 0 0 0 - 0  Feeling bad or failure about yourself  1 0 0 - 0  Trouble concentrating 0 1 0 - 0  Moving slowly or fidgety/restless 0 0 0 - 0  Suicidal thoughts 0 0 0 - 0  PHQ-9 Score 5 5 4  - 3  Difficult doing work/chores Not difficult at all Not difficult at all Not difficult at all - Not difficult at all     GAD 7 : Generalized Anxiety Score 05/08/2019 01/23/2019  Nervous, Anxious, on Edge 1 0  Control/stop worrying  1 1  Worry too much - different things 0 1  Trouble relaxing 0 0  Restless 0 0  Easily annoyed or irritable 0 1  Afraid - awful might happen 1 0  Total GAD 7 Score 3 3  Anxiety Difficulty Not difficult at all Not difficult at all     No Known Allergies   Current Outpatient Medications:  .  ALPRAZolam (XANAX) 0.25 MG tablet, Take 1 tablet (0.25 mg total) by mouth at bedtime as needed for anxiety or sleep., Disp: 30 tablet, Rfl: 0 .  aspirin EC 81 MG tablet, Take 81 mg by mouth daily., Disp: , Rfl:  .  enalapril (VASOTEC) 5 MG tablet, TAKE 1 TABLET BY MOUTH  EVERY DAY, Disp: 90 tablet, Rfl: 3 .  Ibuprofen 200 MG CAPS, Take 1 capsule by mouth as needed. , Disp: , Rfl:  .  sertraline (ZOLOFT) 50 MG tablet, TAKE 1 TABLET BY MOUTH  DAILY, Disp: 90 tablet, Rfl: 1  Review of Systems  Constitutional: Negative.   Respiratory: Negative.   Gastrointestinal: Negative.   Neurological: Negative.    Psychiatric/Behavioral: Positive for confusion and dysphoric mood. Negative for agitation, behavioral problems, decreased concentration, hallucinations, self-injury, sleep disturbance and suicidal ideas. The patient is nervous/anxious. The patient is not hyperactive.     Social History   Tobacco Use  . Smoking status: Never Smoker  . Smokeless tobacco: Never Used  Substance Use Topics  . Alcohol use: Yes    Alcohol/week: 0.0 - 7.0 standard drinks    Comment: 1 drink daily      Objective:   BP 124/78 (BP Location: Right Arm, Patient Position: Sitting, Cuff Size: Normal)   Pulse (!) 57   Temp (!) 97.1 F (36.2 C) (Temporal)   Wt 139 lb (63 kg)   BMI 24.62 kg/m  Vitals:   05/08/19 1319  BP: 124/78  Pulse: (!) 57  Temp: (!) 97.1 F (36.2 C)  TempSrc: Temporal  Weight: 139 lb (63 kg)  Body mass index is 24.62 kg/m.   Physical Exam Vitals signs reviewed.  Constitutional:      General: She is not in acute distress.    Appearance: Normal appearance. She is well-developed. She is not diaphoretic.  HENT:     Head: Normocephalic and atraumatic.  Eyes:     General: No scleral icterus.    Conjunctiva/sclera: Conjunctivae normal.  Neck:     Musculoskeletal: Neck supple.     Thyroid: No thyromegaly.  Cardiovascular:     Rate and Rhythm: Normal rate and regular rhythm.     Pulses: Normal pulses.     Heart sounds: Normal heart sounds. No murmur.  Pulmonary:     Effort: Pulmonary effort is normal. No respiratory distress.     Breath sounds: Normal breath sounds. No wheezing, rhonchi or rales.  Musculoskeletal:     Right lower leg: No edema.     Left lower leg: No edema.  Lymphadenopathy:     Cervical: No cervical adenopathy.  Skin:    General: Skin is warm and dry.     Capillary Refill: Capillary refill takes less than 2 seconds.     Findings: No rash.  Neurological:     General: No focal deficit present.     Mental Status: She is alert.     Cranial Nerves: No  cranial nerve deficit.     Sensory: No sensory deficit.     Motor: No weakness.     Coordination: Coordination normal.  Gait: Gait normal.     Deep Tendon Reflexes: Reflexes normal.  Psychiatric:        Mood and Affect: Mood normal.        Behavior: Behavior normal.      No results found for any visits on 05/08/19.     Assessment & Plan   Problem List Items Addressed This Visit      Other   Memory loss - Primary    New problem Patient has had no signs of cognitive deficit in the past She is independent in all of her ADLs and IADLs She did have one episode concerning for cognitive deficit, but she has not had other episodes where she is forgetful that she is aware of I do not have a baseline MMSE, but she did have a difficult time with recall and she did get the date wrong We discussed that the differential is quite broad for this We will start with some labs UA was checked today and showed no signs of infection, but did have some trace blood, so will be sent for micro and culture Discussed that stress and anxiety/depression can cause a pseudodementia, but she denies any depression Over the next 2 months, she will keep a log of any concerning episodes that occur If no causes found with lab work and this continues, we could consider brain MRI Could consider Aricept or Namenda in the future as well Follow-up in 2 months and repeat MMSE      Relevant Orders   B12 (Completed)   CBC w/Diff/Platelet (Completed)   Comprehensive metabolic panel (Completed)   TSH (Completed)   POCT urinalysis dipstick (Completed)    Other Visit Diagnoses    Hematuria, unspecified type       Relevant Orders   Urinalysis, microscopic only (Completed)   CULTURE, URINE COMPREHENSIVE       Return in about 2 months (around 07/08/2019) for memory follow-up.   The entirety of the information documented in the History of Present Illness, Review of Systems and Physical Exam were personally  obtained by me. Portions of this information were initially documented by Ashley Royalty, CMA and reviewed by me for thoroughness and accuracy.    Pennye Beeghly, Dionne Bucy, MD MPH [facility]

## 2019-05-08 ENCOUNTER — Ambulatory Visit (INDEPENDENT_AMBULATORY_CARE_PROVIDER_SITE_OTHER): Payer: Medicare Other | Admitting: Family Medicine

## 2019-05-08 ENCOUNTER — Other Ambulatory Visit: Payer: Self-pay

## 2019-05-08 ENCOUNTER — Encounter: Payer: Self-pay | Admitting: Family Medicine

## 2019-05-08 VITALS — BP 124/78 | HR 57 | Temp 97.1°F | Wt 139.0 lb

## 2019-05-08 DIAGNOSIS — R413 Other amnesia: Secondary | ICD-10-CM

## 2019-05-08 DIAGNOSIS — R319 Hematuria, unspecified: Secondary | ICD-10-CM | POA: Diagnosis not present

## 2019-05-08 LAB — POCT URINALYSIS DIPSTICK
Bilirubin, UA: NEGATIVE
Glucose, UA: NEGATIVE
Ketones, UA: NEGATIVE
Leukocytes, UA: NEGATIVE
Nitrite, UA: NEGATIVE
Protein, UA: NEGATIVE
Spec Grav, UA: 1.01 (ref 1.010–1.025)
Urobilinogen, UA: 0.2 E.U./dL
pH, UA: 6.5 (ref 5.0–8.0)

## 2019-05-08 NOTE — Patient Instructions (Signed)

## 2019-05-09 DIAGNOSIS — R413 Other amnesia: Secondary | ICD-10-CM | POA: Insufficient documentation

## 2019-05-09 LAB — URINALYSIS, MICROSCOPIC ONLY
Bacteria, UA: NONE SEEN
Casts: NONE SEEN /lpf

## 2019-05-09 LAB — COMPREHENSIVE METABOLIC PANEL
ALT: 11 IU/L (ref 0–32)
AST: 20 IU/L (ref 0–40)
Albumin/Globulin Ratio: 1.7 (ref 1.2–2.2)
Albumin: 4.3 g/dL (ref 3.7–4.7)
Alkaline Phosphatase: 82 IU/L (ref 39–117)
BUN/Creatinine Ratio: 19 (ref 12–28)
BUN: 15 mg/dL (ref 8–27)
Bilirubin Total: 0.5 mg/dL (ref 0.0–1.2)
CO2: 22 mmol/L (ref 20–29)
Calcium: 9.6 mg/dL (ref 8.7–10.3)
Chloride: 103 mmol/L (ref 96–106)
Creatinine, Ser: 0.77 mg/dL (ref 0.57–1.00)
GFR calc Af Amer: 85 mL/min/{1.73_m2} (ref >59)
GFR calc non Af Amer: 74 mL/min/{1.73_m2} (ref >59)
Globulin, Total: 2.6 g/dL (ref 1.5–4.5)
Glucose: 91 mg/dL (ref 65–99)
Potassium: 4.5 mmol/L (ref 3.5–5.2)
Sodium: 138 mmol/L (ref 134–144)
Total Protein: 6.9 g/dL (ref 6.0–8.5)

## 2019-05-09 LAB — CBC WITH DIFFERENTIAL/PLATELET
Basophils Absolute: 0 10*3/uL (ref 0.0–0.2)
Basos: 1 %
EOS (ABSOLUTE): 0.1 10*3/uL (ref 0.0–0.4)
Eos: 2 %
Hematocrit: 38.7 % (ref 34.0–46.6)
Hemoglobin: 13.3 g/dL (ref 11.1–15.9)
Immature Grans (Abs): 0 10*3/uL (ref 0.0–0.1)
Immature Granulocytes: 0 %
Lymphocytes Absolute: 1.4 10*3/uL (ref 0.7–3.1)
Lymphs: 29 %
MCH: 31.9 pg (ref 26.6–33.0)
MCHC: 34.4 g/dL (ref 31.5–35.7)
MCV: 93 fL (ref 79–97)
Monocytes Absolute: 0.4 10*3/uL (ref 0.1–0.9)
Monocytes: 8 %
Neutrophils Absolute: 2.9 10*3/uL (ref 1.4–7.0)
Neutrophils: 60 %
Platelets: 182 10*3/uL (ref 150–450)
RBC: 4.17 x10E6/uL (ref 3.77–5.28)
RDW: 12.4 % (ref 11.7–15.4)
WBC: 4.8 10*3/uL (ref 3.4–10.8)

## 2019-05-09 LAB — TSH: TSH: 0.639 u[IU]/mL (ref 0.450–4.500)

## 2019-05-09 LAB — SPECIMEN STATUS REPORT

## 2019-05-09 LAB — VITAMIN B12: Vitamin B-12: 372 pg/mL (ref 232–1245)

## 2019-05-09 NOTE — Assessment & Plan Note (Addendum)
New problem Patient has had no signs of cognitive deficit in the past She is independent in all of her ADLs and IADLs She did have one episode concerning for cognitive deficit, but she has not had other episodes where she is forgetful that she is aware of I do not have a baseline MMSE, but she did have a difficult time with recall and she did get the date wrong We discussed that the differential is quite broad for this We will start with some labs UA was checked today and showed no signs of infection, but did have some trace blood, so will be sent for micro and culture Discussed that stress and anxiety/depression can cause a pseudodementia, but she denies any depression Over the next 2 months, she will keep a log of any concerning episodes that occur If no causes found with lab work and this continues, we could consider brain MRI Could consider Aricept or Namenda in the future as well Follow-up in 2 months and repeat MMSE

## 2019-05-11 LAB — CULTURE, URINE COMPREHENSIVE

## 2019-05-11 LAB — SPECIMEN STATUS REPORT

## 2019-05-13 ENCOUNTER — Telehealth: Payer: Self-pay

## 2019-05-13 NOTE — Telephone Encounter (Signed)
-----   Message from Virginia Crews, MD sent at 05/13/2019  9:08 AM EDT ----- Urine culture confirms there is no UTI

## 2019-05-13 NOTE — Telephone Encounter (Signed)
Pt advised.   Thanks,   -Laura  

## 2019-05-16 DIAGNOSIS — H6123 Impacted cerumen, bilateral: Secondary | ICD-10-CM | POA: Diagnosis not present

## 2019-05-16 DIAGNOSIS — H9190 Unspecified hearing loss, unspecified ear: Secondary | ICD-10-CM | POA: Diagnosis not present

## 2019-06-29 ENCOUNTER — Other Ambulatory Visit: Payer: Self-pay | Admitting: Family Medicine

## 2019-06-29 DIAGNOSIS — F339 Major depressive disorder, recurrent, unspecified: Secondary | ICD-10-CM

## 2019-07-09 ENCOUNTER — Encounter: Payer: Self-pay | Admitting: Family Medicine

## 2019-07-09 ENCOUNTER — Other Ambulatory Visit: Payer: Self-pay

## 2019-07-09 ENCOUNTER — Ambulatory Visit (INDEPENDENT_AMBULATORY_CARE_PROVIDER_SITE_OTHER): Payer: Medicare Other | Admitting: Family Medicine

## 2019-07-09 VITALS — BP 160/67 | HR 54 | Temp 97.6°F | Resp 16 | Ht 63.0 in | Wt 140.0 lb

## 2019-07-09 DIAGNOSIS — G3184 Mild cognitive impairment, so stated: Secondary | ICD-10-CM | POA: Diagnosis not present

## 2019-07-09 DIAGNOSIS — R4181 Age-related cognitive decline: Secondary | ICD-10-CM

## 2019-07-09 MED ORDER — DONEPEZIL HCL 5 MG PO TABS: 5.0000 mg | ORAL_TABLET | Freq: Every day | ORAL | 3 refills | Status: DC

## 2019-07-09 NOTE — Progress Notes (Signed)
Patient: Elaine [name] Female    DOB: Jul 09, 1941   78 y.o.   MRN: [id number] Visit Date: 07/09/2019  Today's Provider: Lavon Paganini, MD   Chief Complaint  Patient presents with  . Follow-up   Subjective:     HPI Patient here today to follow from office visit 05/08/2019. Patient was advised to keep a log of any concerning episodes.   She and her daughter provide several examples of episodes of memory lapses.  Daughter reports that she and other family members have actually been concerned about the patient's memory for >1.5 yrs.     No Known Allergies   Current Outpatient Medications:  .  ALPRAZolam (XANAX) 0.25 MG tablet, Take 1 tablet (0.25 mg total) by mouth at bedtime as needed for anxiety or sleep., Disp: 30 tablet, Rfl: 0 .  aspirin EC 81 MG tablet, Take 81 mg by mouth daily., Disp: , Rfl:  .  enalapril (VASOTEC) 5 MG tablet, TAKE 1 TABLET BY MOUTH  EVERY DAY, Disp: 90 tablet, Rfl: 3 .  Ibuprofen 200 MG CAPS, Take 1 capsule by mouth as needed. , Disp: , Rfl:  .  sertraline (ZOLOFT) 50 MG tablet, TAKE 1 TABLET BY MOUTH  DAILY, Disp: 90 tablet, Rfl: 3  Review of Systems  Constitutional: Negative.   Cardiovascular: Negative.   Psychiatric/Behavioral: Negative.     Social History   Tobacco Use  . Smoking status: Never Smoker  . Smokeless tobacco: Never Used  Substance Use Topics  . Alcohol use: Yes    Alcohol/week: 0.0 - 7.0 standard drinks    Comment: 1 drink daily      Objective:   BP (!) 160/67 (BP Location: Right Arm, Patient Position: Sitting, Cuff Size: Normal)   Pulse (!) 54   Temp 97.6 F (36.4 C) (Temporal)   Resp 16   Ht 5\' 3"  (1.6 m)   Wt 140 lb (63.5 kg)   BMI 24.80 kg/m  Vitals:   07/09/19 1140 07/09/19 1142  BP: (!) 176/70 (!) 160/67  Pulse: 70 (!) 54  Resp: 16   Temp: 97.6 F (36.4 C)   TempSrc: Temporal   Weight: 140 lb (63.5 kg)   Height: 5\' 3"  (1.6 m)   Body mass index is 24.8 kg/m.   Physical Exam Vitals signs  reviewed.  Constitutional:      General: She is not in acute distress.    Appearance: Normal appearance. She is well-developed. She is not diaphoretic.  HENT:     Head: Normocephalic and atraumatic.  Eyes:     General: No scleral icterus.    Conjunctiva/sclera: Conjunctivae normal.  Neck:     Musculoskeletal: Neck supple.     Thyroid: No thyromegaly.  Cardiovascular:     Rate and Rhythm: Normal rate and regular rhythm.     Pulses: Normal pulses.     Heart sounds: Normal heart sounds. No murmur.  Pulmonary:     Effort: Pulmonary effort is normal. No respiratory distress.     Breath sounds: Normal breath sounds. No wheezing, rhonchi or rales.  Musculoskeletal:     Right lower leg: No edema.     Left lower leg: No edema.  Lymphadenopathy:     Cervical: No cervical adenopathy.  Skin:    General: Skin is warm and dry.     Capillary Refill: Capillary refill takes less than 2 seconds.     Findings: No rash.  Neurological:     Mental Status:  She is alert and oriented to person, place, and time. Mental status is at baseline.  Psychiatric:        Mood and Affect: Mood normal.        Behavior: Behavior normal.     MMSE 27/30   No results found for any visits on 07/09/19.     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   The entirety of the information documented in the History of Present Illness, Review of Systems and Physical Exam were personally obtained by me. Portions of this information were initially documented by Lynford Humphrey, CMA and reviewed by me for thoroughness and accuracy.    Laqueisha Catalina, Dionne Bucy, MD MPH [facility]

## 2019-07-09 NOTE — Patient Instructions (Signed)
Mild Neurocognitive Disorder Mild neurocognitive disorder (formerly known as mild cognitive impairment) is a disorder in which memory does not work as well as it should. This disorder may also affect other mental functions, including thought, communication, behavior, and completion of tasks. These impairments are noticeable and measurable, but for the most part they do not interfere with daily activities or the ability to live independently. Mild neurocognitive disorder typically occurs in people older than 60 years but can occur earlier. It is not as serious as major neurocognitive disorder (formerly known as dementia), but it may be the first sign of it. Generally, symptoms of this condition get worse over time. In rare cases, symptoms can get better. What are the causes? This condition may be caused by:  Brain disorders associated with abnormal protein deposits in the brain, such as: ? Alzheimer disease. ? Frontotemporal dementia. ? Dementia with Lewy bodies.  Brain disorders associated with abnormal movement, such as Parkinson disease or Huntington disease.  Diseases that affect blood vessels in the brain and result in small strokes.  Certain infections, such as HIV.  Traumatic brain injury.  Other medical conditions, such as brain tumors, under-active thyroid (hypothyroidism), and vitamin B12 deficiency.  Use of certain drugs or prescription medicines.  Untreated sleep apnea.  Heart disease, lung disease, liver disease, or kidney disease. What are the signs or symptoms? Symptoms of this condition include:  Difficulty remembering. You may: ? Forget details of recent events, names, or phone numbers. ? Forget social events and appointments. ? Repeatedly forget where you put your car keys or other items.  Difficulty thinking and solving problems. You may have trouble with complex tasks, such as: ? Paying bills. ? Driving in unfamiliar places.  Difficulty communicating. You may  have trouble: ? Finding the right word or naming an object. ? Forming a sentence that makes sense, or understanding what you read or hear.  Changes in your behavior or personality. When this happens, you may: ? Lose interest in the things that you used to enjoy. ? Withdraw from social situations. ? Get angry more easily than usual. ? Act before thinking. ? Do things in public that you would not usually do. How is this diagnosed? This condition is diagnosed based on:  Your symptoms. Your health care provider may ask you as well as people you spend time with, such as family and friends, about your symptoms. Questions may include: ? How often symptoms occur. ? How long they have been occurring. ? Whether they are getting worse. ? The effect they are having on your life.  Evaluation of mental functions (neuropsychological testing). Your health care provider may refer you to a neurologist or mental health specialist for a detailed evaluation of your mental functions. To identify the cause of your mild neurocognitive disorder, your health care provider may:  Get a detailed medical history.  Ask about alcohol and drug use, including prescription medicines.  Perform a physical exam.  Order blood tests and brain imaging exams. How is this treated? If mild neurocognitive disorder is caused by medicine, drug use, infection, or another medical condition, it may improve when the cause is treated, or when medicines or drugs are stopped. Mild neurocognitive disorder resulting from other causes generally does not improve and may worsen. In these cases, the goal of treatment is to help you cope with the loss of mental function. Treatments in these cases include:  Medicine. Medicine helps mainly with memory loss and behavioral symptoms.  Talk therapy. Talk  therapy provides education, emotional support, memory aids, and other ways of making up for impairments in mental function.  Lifestyle changes,  including: ? Regular exercise. ? A healthy diet that includes omega-3 fatty acids. ? Intellectual stimulation. ? Increased social interaction. Follow these instructions at home: Lifestyle   Exercise regularly as told by your health care provider.  Do not use any products that contain nicotine or tobacco, such as cigarettes and e-cigarettes. If you need help quitting, ask your health care provider.  Practice stress-management techniques when you get stressed. If you need help managing stress, ask your health care provider.  Stay social.  Keep your mind active with stimulating activities you enjoy, such as reading or playing games.  Make sure to get quality sleep. These tips can help you to get a good night's rest: ? Avoid napping during the day. ? Keep your sleeping area dark and cool. ? Avoid exercising during the few hours before you go to bed. ? Avoid caffeine products in the evening. Eating and drinking  Drink enough fluid to keep your urine clear or pale yellow.  Eat a healthy diet that includes omega-3 fatty acids. These can be found in: ? Fish. ? Nuts. ? Leafy vegetables. ? Vegetable oils. General instructions  Take over-the-counter and prescription medicines only as told by your health care provider. Your health care provider may recommend that you avoid taking medicines that can affect thinking, such as pain or sleeping medicines.  Work with your health care provider to determine what you need help with and what your safety needs are.  Keep all follow-up visits as told by your health care provider. This is important. Contact a health care provider if:  You have any new symptoms. Get help right away if:  You develop new or worsening confusion.  You have behavioral outbursts that place you or your family in danger. Summary  Mild neurocognitive disorder is a disorder in which memory does not work as well as it should. For the most part, this condition does not  interfere with a person's daily activities or ability to live independently.  Mild neurocognitive disorder can have many causes and may be the first stage of Alzheimer disease or other types of dementia.  Exercise, healthy diet, getting quality sleep, and keeping your mind active are very important for brain health. This information is not intended to replace advice given to you by your health care provider. Make sure you discuss any questions you have with your health care provider. Document Released: 04/10/2013 Document Revised: 07/21/2017 Document Reviewed: 10/12/2016 Elsevier Patient Education  2020 [employer]  

## 2019-07-10 DIAGNOSIS — F0390 Unspecified dementia without behavioral disturbance: Secondary | ICD-10-CM | POA: Insufficient documentation

## 2019-07-10 DIAGNOSIS — G3184 Mild cognitive impairment, so stated: Secondary | ICD-10-CM | POA: Insufficient documentation

## 2019-07-10 NOTE — Assessment & Plan Note (Signed)
New diagnosis Seems that memory loss has been going on for longer than the patient mentioned at last visit Patient's labs all normal at last visit Discussed getting MRI brain to ensure no structural abnormalities that are contributing Will also start Aricept 5mg  qhs Discussed possible side effects Can consider Neurology referral as well in the future Will plan to increase Aricept and consider Namenda

## 2019-07-25 ENCOUNTER — Ambulatory Visit
Admission: RE | Admit: 2019-07-25 | Discharge: 2019-07-25 | Disposition: A | Discharge status: Home / Self Care | Payer: Medicare Other | Source: Ambulatory Visit | Attending: Family Medicine | Admitting: Family Medicine

## 2019-07-25 ENCOUNTER — Other Ambulatory Visit: Payer: Self-pay

## 2019-07-25 DIAGNOSIS — G3184 Mild cognitive impairment, so stated: Secondary | ICD-10-CM | POA: Diagnosis not present

## 2019-07-25 DIAGNOSIS — R4181 Age-related cognitive decline: Secondary | ICD-10-CM | POA: Insufficient documentation

## 2019-07-26 ENCOUNTER — Telehealth: Payer: Self-pay

## 2019-07-26 NOTE — Telephone Encounter (Signed)
-----   Message from Virginia Crews, MD sent at 07/25/2019  1:39 PM EST ----- No acute abnormalities on MRI.  Signs of mild atrophy and chronic small vessel ischemic changes consistent with age-related and vascular cognitive decline.

## 2019-07-26 NOTE — Telephone Encounter (Signed)
Tried calling; no answer.   Thanks,   -Yuritzi Kamp  

## 2019-07-26 NOTE — Telephone Encounter (Signed)
Viewed by Levon Hedger on 07/25/2019 9:39 PM Written by Virginia Crews, MD on 07/25/2019 1:39 PM No acute abnormalities on MRI. Signs of mild atrophy and chronic small vessel ischemic changes consistent with age-related and vascular cognitive decline.

## 2019-07-29 NOTE — Telephone Encounter (Signed)
Apt made for 07/30/2019  Thanks,   -Mickel Baas

## 2019-07-29 NOTE — Telephone Encounter (Signed)
Daughter Sharee Pimple calling back. [phone]

## 2019-07-30 ENCOUNTER — Telehealth (INDEPENDENT_AMBULATORY_CARE_PROVIDER_SITE_OTHER): Payer: Medicare Other | Admitting: Family Medicine

## 2019-07-30 DIAGNOSIS — G3184 Mild cognitive impairment, so stated: Secondary | ICD-10-CM | POA: Diagnosis not present

## 2019-07-30 MED ORDER — DONEPEZIL HCL 10 MG PO TABS: 10.0000 mg | ORAL_TABLET | Freq: Every day | ORAL | 1 refills | Status: DC

## 2019-07-30 NOTE — Progress Notes (Signed)
Patient: Elaine [name] Female    DOB: 1941/03/11   78 y.o.   MRN: [id number] Visit Date: 07/30/2019  Today's Provider: Lavon Paganini, MD   Chief Complaint  Patient presents with  . Referral   Subjective:    I Sulibeya S. Dimas, CMA, am acting as scribe for Lavon Paganini, MD.  Virtual Visit via Telephone Note I connected with Weber Cooks on 07/30/19 at  9:40 AM EST by telephone and verified that I am speaking with the correct person using two identifiers.  Location: Patient location: home Provider location: [facility] Persons involved in the visit: patient, provider, patient's daughter   I discussed the limitations, risks, security and privacy concerns of performing an evaluation and management service by telephone and the availability of in person appointments. I also discussed with the patient that there may be a patient responsible charge related to this service. The patient expressed understanding and agreed to proceed.   HPI  Follow up for mild cognitive impairment with memory loss  The patient was last seen for this 4 weeks ago. Changes made at last visit include starting Aricept 5 mg qhs.   She reports excellent compliance with treatment. She feels that condition is Unchanged. She is not having side effects.   They have questions about the MRI results.  We reviewed the results in detail.  I reviewed this personally.  She does not recall any head injuries or hitting her head with any falls.  She was unaware that she had a previous subdural hematoma, but we discussed that the MRI shows that it is old and not fresh and this is not contributing to her memory issues  She is not noticing a difference in her memory at.  She is tolerating Aricept well.  She denies any diarrhea or other side effects. ------------------------------------------------------------------------------------   No Known Allergies   Current Outpatient  Medications:  .  ALPRAZolam (XANAX) 0.25 MG tablet, Take 1 tablet (0.25 mg total) by mouth at bedtime as needed for anxiety or sleep., Disp: 30 tablet, Rfl: 0 .  aspirin EC 81 MG tablet, Take 81 mg by mouth daily., Disp: , Rfl:  .  donepezil (ARICEPT) 5 MG tablet, Take 1 tablet (5 mg total) by mouth at bedtime., Disp: 30 tablet, Rfl: 3 .  enalapril (VASOTEC) 5 MG tablet, TAKE 1 TABLET BY MOUTH  EVERY DAY, Disp: 90 tablet, Rfl: 3 .  Ibuprofen 200 MG CAPS, Take 1 capsule by mouth as needed. , Disp: , Rfl:  .  sertraline (ZOLOFT) 50 MG tablet, TAKE 1 TABLET BY MOUTH  DAILY, Disp: 90 tablet, Rfl: 3  Review of Systems  Constitutional: Negative.   HENT: Negative.   Respiratory: Negative.   Cardiovascular: Negative.   Neurological: Negative.   Psychiatric/Behavioral: Positive for confusion. Negative for agitation, behavioral problems, dysphoric mood and sleep disturbance. The patient is not nervous/anxious.     Social History   Tobacco Use  . Smoking status: Never Smoker  . Smokeless tobacco: Never Used  Substance Use Topics  . Alcohol use: Yes    Alcohol/week: 0.0 - 7.0 standard drinks    Comment: 1 drink daily      Objective:   There were no vitals taken for this visit. There were no vitals filed for this visit.There is no height or weight on file to calculate BMI.   Physical Exam Speaking in full sentences, in NAD. Oriented x3, at baseline  No results found for any visits  on 07/30/19.    Assessment & Plan      I discussed the assessment and treatment plan with the patient. The patient was provided an opportunity to ask questions and all were answered. The patient agreed with the plan and demonstrated an understanding of the instructions.   The patient was advised to call back or seek an in-person evaluation if the symptoms worsen or if the condition fails to improve as anticipated.  I provided 25 minutes of non-face-to-face time during this encounter.    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 Portions of this information were initially documented by Lynford Humphrey , CMA and reviewed by me for thoroughness and accuracy.    , Dionne Bucy, MD MPH [facility]

## 2019-07-31 NOTE — Assessment & Plan Note (Signed)
Reviewed all labs and MRI with the patient and her daughter Discussed that these have not revealed any secondary causes for memory loss Discussed that this is age-related memory loss and early dementia changes Since she is tolerating Aricept well, we will increase to 10 mg daily At follow-up next month, we can see how she is doing on this dose Discussed beers list drugs to avoid

## 2019-08-29 NOTE — Progress Notes (Signed)
Subjective:   Elaine [name] is a 79 y.o. female who presents for Medicare Annual (Subsequent) preventive examination.  Review of Systems:  N/A  Cardiac Risk Factors include: advanced age (>28men, >33 women);hypertension     Objective:     Vitals: BP (!) 142/60 (BP Location: Right Arm)   Pulse (!) 53   Temp (!) 97.1 F (36.2 C) (Other (Comment)) Comment (Src): forehead  Ht 5\' 3"  (1.6 m)   Wt 143 lb (64.9 kg)   BMI 25.33 kg/m   Body mass index is 25.33 kg/m.  Advanced Directives 09/02/2019 08/27/2018 04/14/2017 08/02/2016 01/22/2016 12/09/2015 05/28/2015  Does Patient Have a Medical Advance Directive? No No No Yes;No No No No  Does patient want to make changes to medical advance directive? - - - No - Patient declined - - -  Would patient like information on creating a medical advance directive? Yes (MAU/Ambulatory/Procedural Areas - Information given) Yes (MAU/Ambulatory/Procedural Areas - Information given) Yes (ED - Information included in AVS) - - - -    Tobacco Social History   Tobacco Use  Smoking Status Never Smoker  Smokeless Tobacco Never Used     Counseling given: Not Answered   Clinical Intake:  Pre-visit preparation completed: Yes  Pain : No/denies pain Pain Score: 0-No pain     Nutritional Status: BMI 25 -29 Overweight Nutritional Risks: Nausea/ vomitting/ diarrhea(Diarrhea at times due to medications.) Diabetes: No  How often do you need to have someone help you when you read instructions, pamphlets, or other written materials from your doctor or pharmacy?: 1 - Never  Interpreter Needed?: No  Information entered by :: [facility], LPN  Past Medical History:  Diagnosis Date  . Alzheimer disease ([place])   . Depression    Past Surgical History:  Procedure Laterality Date  . ANKLE SURGERY    . AUGMENTATION MAMMAPLASTY Bilateral 2006  . BREAST BIOPSY Right 1984   EXCISIONAL - NEG  . BREAST ENHANCEMENT SURGERY Bilateral   . HEMORRHOID SURGERY   2011   Hemorrhoidectomy  . TONSILLECTOMY AND ADENOIDECTOMY    . TUBAL LIGATION     Family History  Problem Relation Age of Onset  . Heart attack Father   . Hypertension Sister        pulmonary HTN  . Breast cancer Maternal Aunt    Social History   Socioeconomic History  . Marital status: Widowed    Spouse name: Not on file  . Number of children: 2  . Years of education: H/S  . Highest education level: High school graduate  Occupational History  . Occupation: Retired    Comment: Psychologist, educational, Retail buyer  Tobacco Use  . Smoking status: Never Smoker  . Smokeless tobacco: Never Used  Substance and Sexual Activity  . Alcohol use: Yes    Alcohol/week: 0.0 - 7.0 standard drinks    Comment: up to 1 drink daily  . Drug use: No  . Sexual activity: Not on file  Other Topics Concern  . Not on file  Social History Narrative  . Not on file   Social Determinants of Health   Financial Resource Strain: Low Risk   . Difficulty of Paying Living Expenses: Not hard at all  Food Insecurity: No Food Insecurity  . Worried About Charity fundraiser in the Last Year: Never true  . Ran Out of Food in the Last Year: Never true  Transportation Needs: No Transportation Needs  . Lack of Transportation (Medical): No  .  Lack of Transportation (Non-Medical): No  Physical Activity: Inactive  . Days of Exercise per Week: 0 days  . Minutes of Exercise per Session: 0 min  Stress: No Stress Concern Present  . Feeling of Stress : Not at all  Social Connections: Somewhat Isolated  . Frequency of Communication with Friends and Family: More than three times a week  . Frequency of Social Gatherings with Friends and Family: More than three times a week  . Attends Religious Services: More than 4 times per year  . Active Member of Clubs or Organizations: No  . Attends Archivist Meetings: Never  . Marital Status: Widowed    Outpatient Encounter Medications as of 09/02/2019   Medication Sig  . aspirin EC 81 MG tablet Take 81 mg by mouth daily.  Marland Kitchen donepezil (ARICEPT) 10 MG tablet Take 1 tablet (10 mg total) by mouth at bedtime.  . enalapril (VASOTEC) 5 MG tablet TAKE 1 TABLET BY MOUTH  EVERY DAY  . Ibuprofen 200 MG CAPS Take 1 capsule by mouth as needed.   . sertraline (ZOLOFT) 50 MG tablet TAKE 1 TABLET BY MOUTH  DAILY   No facility-administered encounter medications on file as of 09/02/2019.    Activities of Daily Living In your present state of health, do you have any difficulty performing the following activities: 09/02/2019  Hearing? Y  Comment Wears bilateral hearing aids.  Vision? Y  Comment Wears eye glasses for reading.  Difficulty concentrating or making decisions? Y  Comment Currently on Aricept for memory loss.  Walking or climbing stairs? N  Dressing or bathing? N  Doing errands, shopping? N  Preparing Food and eating ? N  Using the Toilet? N  In the past six months, have you accidently leaked urine? N  Do you have problems with loss of bowel control? Y  Comment Occasionally due to diarrhea from medications.  Managing your Medications? N  Managing your Finances? N  Housekeeping or managing your Housekeeping? N  Some recent data might be hidden    Patient Care Team: Virginia Crews, MD as PCP - General (Family Medicine) Michaelle Birks, Scarlette Ar, MD as Referring Physician (Cardiology) [place], [place] as Consulting Physician (Optometry) Sheppard Coil Diona Fanti, MD as Referring Physician    Assessment:   This is a routine wellness examination for Elaine [name].  Exercise Activities and Dietary recommendations Current Exercise Habits: The patient does not participate in regular exercise at present, Exercise limited by: None identified  Goals    . Exercise 150 minutes per week (moderate activity)    . Increase water intake     Starting 08/02/16, I will increase my water intake to 4 glasses every day.    Marland Kitchen LIFESTYLE - DECREASE FALLS  RISK     Recommend to remove any items from the home that may cause slips or trips.        Fall Risk: Fall Risk  09/02/2019 07/09/2019 08/27/2018 08/21/2018 04/14/2017  Falls in the past year? 0 0 1 1 No  Number falls in past yr: 0 0 1 0 -  Injury with Fall? 0 0 1 1 -  Comment - - Compression fracture - -  Follow up - Falls evaluation completed Falls prevention discussed - -    FALL RISK PREVENTION PERTAINING TO THE HOME:  Any stairs in or around the home? Yes  If so, are there any without handrails? No   Home free of loose throw rugs in walkways, pet beds, electrical cords,  etc? Yes  Adequate lighting in your home to reduce risk of falls? Yes   ASSISTIVE DEVICES UTILIZED TO PREVENT FALLS:  Life alert? No  Use of a cane, walker or w/c? No  Grab bars in the bathroom? Yes  Shower chair or bench in shower? Yes  Elevated toilet seat or a handicapped toilet? No    TIMED UP AND GO:  Was the test performed? No .    Depression Screen: Not currently due.  PHQ 2/9 Scores 09/02/2019 05/08/2019 01/23/2019 08/27/2018  PHQ - 2 Score 1 3 2 2   PHQ- 9 Score - 5 5 4      Cognitive Function MMSE - Mini Mental State Exam 07/09/2019 05/08/2019  Orientation to time 4 3  Orientation to Place 4 4  Registration 3 3  Attention/ Calculation 5 5  Recall 2 1  Language- name 2 objects 2 2  Language- repeat 1 1  Language- follow 3 step command 3 3  Language- read & follow direction 1 1  Write a sentence 1 1  Copy design 1 1  Total score 27 25     6CIT Screen 08/02/2016  What Year? 0 points  What month? 0 points  What time? 0 points  Count back from 20 0 points  Months in reverse 0 points  Repeat phrase 2 points  Total Score 2    Immunization History  Administered Date(s) Administered  . Influenza, High Dose Seasonal PF 05/31/2019  . Influenza,inj,Quad PF,6+ Mos 08/28/2015  . Influenza,inj,quad, With Preservative 06/14/2017  . Influenza-Unspecified 06/22/2018  . Pneumococcal  Conjugate-13 05/28/2014  . Pneumococcal Polysaccharide-23 08/28/2015  . Zoster 11/03/2010  . Zoster Recombinat (Shingrix) 06/12/2019    Qualifies for Shingles Vaccine? Yes . Awaiting second dose.   Tdap: Although this vaccine is not a covered service during a Wellness Exam, does the patient still wish to receive this vaccine today?  No . Advised may receive this vaccine at local pharmacy or Health Dept. Aware to provide a copy of the vaccination record if obtained from local pharmacy or Health Dept. Verbalized acceptance and understanding.  Flu Vaccine: Up to date  Pneumococcal Vaccine: Completed series  Screening Tests Health Maintenance  Topic Date Due  . DEXA SCAN  04/30/2016  . TETANUS/TDAP  09/01/2020 (Originally 03/08/1960)  . INFLUENZA VACCINE  Completed  . PNA vac Low Risk Adult  Completed    Cancer Screenings:  Colorectal Screening: No longer required.   Mammogram: No longer required.   Bone Density: Completed 04/30/14. Results reflect OSTEOPOROSIS. Repeat every 2 years.  Declined order today.  Lung Cancer Screening: (Low Dose CT Chest recommended if Age 1-80 years, 30 pack-year currently smoking OR have quit w/in 15years.) does not qualify.   Additional Screening:  Vision Screening: Recommended annual ophthalmology exams for early detection of glaucoma and other disorders of the eye.  Dental Screening: Recommended annual dental exams for proper oral hygiene  Community Resource Referral:  CRR required this visit?  No       Plan:  I have personally reviewed and addressed the Medicare Annual Wellness questionnaire and have noted the following in the patient's chart:  A. Medical and social history B. Use of alcohol, tobacco or illicit drugs  C. Current medications and supplements D. Functional ability and status E.  Nutritional status F.  Physical activity G. Advance directives H. List of other physicians I.  Hospitalizations, surgeries, and ER visits in  previous 12 months J.  Vitals K. Screenings such as hearing and  vision if needed, cognitive and depression L. Referrals and appointments   In addition, I have reviewed and discussed with patient certain preventive protocols, quality metrics, and best practice recommendations. A written personalized care plan for preventive services as well as general preventive health recommendations were provided to patient. Nurse Health Advisor  Signed,    Atticus Wedin [place], [place]  [id number] Nurse Health Advisor   Nurse Notes: Pt declined the DEXA scan order today.

## 2019-09-02 ENCOUNTER — Other Ambulatory Visit: Payer: Self-pay

## 2019-09-02 ENCOUNTER — Encounter: Payer: Self-pay | Admitting: Family Medicine

## 2019-09-02 ENCOUNTER — Ambulatory Visit (INDEPENDENT_AMBULATORY_CARE_PROVIDER_SITE_OTHER): Payer: Medicare Other | Admitting: Family Medicine

## 2019-09-02 ENCOUNTER — Ambulatory Visit (INDEPENDENT_AMBULATORY_CARE_PROVIDER_SITE_OTHER): Payer: Medicare Other

## 2019-09-02 VITALS — BP 142/60 | HR 53 | Temp 97.1°F | Ht 63.0 in | Wt 143.0 lb

## 2019-09-02 VITALS — BP 142/60 | HR 63 | Temp 97.1°F | Resp 16 | Ht 63.0 in | Wt 143.0 lb

## 2019-09-02 DIAGNOSIS — F3342 Major depressive disorder, recurrent, in full remission: Secondary | ICD-10-CM

## 2019-09-02 DIAGNOSIS — G3184 Mild cognitive impairment, so stated: Secondary | ICD-10-CM | POA: Diagnosis not present

## 2019-09-02 DIAGNOSIS — E7800 Pure hypercholesterolemia, unspecified: Secondary | ICD-10-CM

## 2019-09-02 DIAGNOSIS — Z0000 Encounter for general adult medical examination without abnormal findings: Secondary | ICD-10-CM

## 2019-09-02 MED ORDER — MEMANTINE HCL 5 MG PO TABS: 5.0000 mg | ORAL_TABLET | Freq: Every day | ORAL | 2 refills | Status: DC

## 2019-09-02 MED ORDER — DONEPEZIL HCL 5 MG PO TABS: 5.0000 mg | ORAL_TABLET | Freq: Every day | ORAL | 3 refills | Status: DC

## 2019-09-02 NOTE — Patient Instructions (Signed)
Preventive Care 80 Years and Older, Female Preventive care refers to lifestyle choices and visits with your health care provider that can promote health and wellness. This includes:  A yearly physical exam. This is also called an annual well check.  Regular dental and eye exams.  Immunizations.  Screening for certain conditions.  Healthy lifestyle choices, such as diet and exercise. What can I expect for my preventive care visit? Physical exam Your health care provider will check:  Height and weight. These may be used to calculate body mass index (BMI), which is a measurement that tells if you are at a healthy weight.  Heart rate and blood pressure.  Your skin for abnormal spots. Counseling Your health care provider may ask you questions about:  Alcohol, tobacco, and drug use.  Emotional well-being.  Home and relationship well-being.  Sexual activity.  Eating habits.  History of falls.  Memory and ability to understand (cognition).  Work and work Statistician.  Pregnancy and menstrual history. What immunizations do I need?  Influenza (flu) vaccine  This is recommended every year. Tetanus, diphtheria, and pertussis (Tdap) vaccine  You may need a Td booster every 10 years. Varicella (chickenpox) vaccine  You may need this vaccine if you have not already been vaccinated. Zoster (shingles) vaccine  You may need this after age 33. Pneumococcal conjugate (PCV13) vaccine  One dose is recommended after age 33. Pneumococcal polysaccharide (PPSV23) vaccine  One dose is recommended after age 72. Measles, mumps, and rubella (MMR) vaccine  You may need at least one dose of MMR if you were born in 1957 or later. You may also need a second dose. Meningococcal conjugate (MenACWY) vaccine  You may need this if you have certain conditions. Hepatitis A vaccine  You may need this if you have certain conditions or if you travel or work in places where you may be exposed  to hepatitis A. Hepatitis B vaccine  You may need this if you have certain conditions or if you travel or work in places where you may be exposed to hepatitis B. Haemophilus influenzae type b (Hib) vaccine  You may need this if you have certain conditions. You may receive vaccines as individual doses or as more than one vaccine together in one shot (combination vaccines). Talk with your health care provider about the risks and benefits of combination vaccines. What tests do I need? Blood tests  Lipid and cholesterol levels. These may be checked every 5 years, or more frequently depending on your overall health.  Hepatitis C test.  Hepatitis B test. Screening  Lung cancer screening. You may have this screening every year starting at age 39 if you have a 30-pack-year history of smoking and currently smoke or have quit within the past 15 years.  Colorectal cancer screening. All adults should have this screening starting at age 36 and continuing until age 15. Your health care provider may recommend screening at age 23 if you are at increased risk. You will have tests every 1-10 years, depending on your results and the type of screening test.  Diabetes screening. This is done by checking your blood sugar (glucose) after you have not eaten for a while (fasting). You may have this done every 1-3 years.  Mammogram. This may be done every 1-2 years. Talk with your health care provider about how often you should have regular mammograms.  BRCA-related cancer screening. This may be done if you have a family history of breast, ovarian, tubal, or peritoneal cancers.  Other tests  Sexually transmitted disease (STD) testing.  Bone density scan. This is done to screen for osteoporosis. You may have this done starting at age 44. Follow these instructions at home: Eating and drinking  Eat a diet that includes fresh fruits and vegetables, whole grains, lean protein, and low-fat dairy products. Limit  your intake of foods with high amounts of sugar, saturated fats, and salt.  Take vitamin and mineral supplements as recommended by your health care provider.  Do not drink alcohol if your health care provider tells you not to drink.  If you drink alcohol: ? Limit how much you have to 0-1 drink a day. ? Be aware of how much alcohol is in your drink. In the [place] one drink equals one 12 oz bottle of beer (355 mL), one 5 oz glass of wine (148 mL), or one 1 oz glass of hard liquor (44 mL). Lifestyle  Take daily care of your teeth and gums.  Stay active. Exercise for at least 30 minutes on 5 or more days each week.  Do not use any products that contain nicotine or tobacco, such as cigarettes, e-cigarettes, and chewing tobacco. If you need help quitting, ask your health care provider.  If you are sexually active, practice safe sex. Use a condom or other form of protection in order to prevent STIs (sexually transmitted infections).  Talk with your health care provider about taking a low-dose aspirin or statin. What's next?  Go to your health care provider once a year for a well check visit.  Ask your health care provider how often you should have your eyes and teeth checked.  Stay up to date on all vaccines. This information is not intended to replace advice given to you by your health care provider. Make sure you discuss any questions you have with your health care provider. Document Revised: 08/02/2018 Document Reviewed: 08/02/2018 Elsevier Patient Education  2020 [employer].

## 2019-09-02 NOTE — Assessment & Plan Note (Signed)
Patient has previously declined statin therapy Recheck lipid panel

## 2019-09-02 NOTE — Patient Instructions (Signed)
Elaine [name] , Thank you for taking time to come for your Medicare Wellness Visit. I appreciate your ongoing commitment to your health goals. Please review the following plan we discussed and let me know if I can assist you in the future.   Screening recommendations/referrals: Colonoscopy: No longer required.  Mammogram: No longer required.  Bone Density: Currently due. Declined order today.  Recommended yearly ophthalmology/optometry visit for glaucoma screening and checkup Recommended yearly dental visit for hygiene and checkup  Vaccinations: Influenza vaccine: Up to date Pneumococcal vaccine: Completed series Tdap vaccine: Pt declines today.  Shingles vaccine: Awaiting Shingrix second dose.     Advanced directives: Advance directive discussed with you today. I have provided a copy for you to complete at home and have notarized. Once this is complete please bring a copy in to our office so we can scan it into your chart.  Conditions/risks identified: Continue to increase water intake to 6-8 8 oz glasses a day and start walking 3 days a week for at least 30 minutes at a tine.   Next appointment: 10:40 AM with Dr Brita Romp.    Preventive Care 80 Years and Older, Female Preventive care refers to lifestyle choices and visits with your health care provider that can promote health and wellness. What does preventive care include?  A yearly physical exam. This is also called an annual well check.  Dental exams once or twice a year.  Routine eye exams. Ask your health care provider how often you should have your eyes checked.  Personal lifestyle choices, including:  Daily care of your teeth and gums.  Regular physical activity.  Eating a healthy diet.  Avoiding tobacco and drug use.  Limiting alcohol use.  Practicing safe sex.  Taking low-dose aspirin every day.  Taking vitamin and mineral supplements as recommended by your health care provider. What happens during an annual  well check? The services and screenings done by your health care provider during your annual well check will depend on your age, overall health, lifestyle risk factors, and family history of disease. Counseling  Your health care provider may ask you questions about your:  Alcohol use.  Tobacco use.  Drug use.  Emotional well-being.  Home and relationship well-being.  Sexual activity.  Eating habits.  History of falls.  Memory and ability to understand (cognition).  Work and work Statistician.  Reproductive health. Screening  You may have the following tests or measurements:  Height, weight, and BMI.  Blood pressure.  Lipid and cholesterol levels. These may be checked every 5 years, or more frequently if you are over 73 years old.  Skin check.  Lung cancer screening. You may have this screening every year starting at age 80 if you have a 30-pack-year history of smoking and currently smoke or have quit within the past 15 years.  Fecal occult blood test (FOBT) of the stool. You may have this test every year starting at age 80.  Flexible sigmoidoscopy or colonoscopy. You may have a sigmoidoscopy every 5 years or a colonoscopy every 10 years starting at age 80.  Hepatitis C blood test.  Hepatitis B blood test.  Sexually transmitted disease (STD) testing.  Diabetes screening. This is done by checking your blood sugar (glucose) after you have not eaten for a while (fasting). You may have this done every 1-3 years.  Bone density scan. This is done to screen for osteoporosis. You may have this done starting at age 80.  Mammogram. This may be done  every 1-2 years. Talk to your health care provider about how often you should have regular mammograms. Talk with your health care provider about your test results, treatment options, and if necessary, the need for more tests. Vaccines  Your health care provider may recommend certain vaccines, such as:  Influenza vaccine. This  is recommended every year.  Tetanus, diphtheria, and acellular pertussis (Tdap, Td) vaccine. You may need a Td booster every 10 years.  Zoster vaccine. You may need this after age 60.  Pneumococcal 13-valent conjugate (PCV13) vaccine. One dose is recommended after age 80.  Pneumococcal polysaccharide (PPSV23) vaccine. One dose is recommended after age 80. Talk to your health care provider about which screenings and vaccines you need and how often you need them. This information is not intended to replace advice given to you by your health care provider. Make sure you discuss any questions you have with your health care provider. Document Released: 09/04/2015 Document Revised: 04/27/2016 Document Reviewed: 06/09/2015 Elsevier Interactive Patient Education  2017 [place] Prevention in the Home Falls can cause injuries. They can happen to people of all ages. There are many things you can do to make your home safe and to help prevent falls. What can I do on the outside of my home?  Regularly fix the edges of walkways and driveways and fix any cracks.  Remove anything that might make you trip as you walk through a door, such as a raised step or threshold.  Trim any bushes or trees on the path to your home.  Use bright outdoor lighting.  Clear any walking paths of anything that might make someone trip, such as rocks or tools.  Regularly check to see if handrails are loose or broken. Make sure that both sides of any steps have handrails.  Any raised decks and porches should have guardrails on the edges.  Have any leaves, snow, or ice cleared regularly.  Use sand or salt on walking paths during winter.  Clean up any spills in your garage right away. This includes oil or grease spills. What can I do in the bathroom?  Use night lights.  Install grab bars by the toilet and in the tub and shower. Do not use towel bars as grab bars.  Use non-skid mats or decals in the tub or  shower.  If you need to sit down in the shower, use a plastic, non-slip stool.  Keep the floor dry. Clean up any water that spills on the floor as soon as it happens.  Remove soap buildup in the tub or shower regularly.  Attach bath mats securely with double-sided non-slip rug tape.  Do not have throw rugs and other things on the floor that can make you trip. What can I do in the bedroom?  Use night lights.  Make sure that you have a light by your bed that is easy to reach.  Do not use any sheets or blankets that are too big for your bed. They should not hang down onto the floor.  Have a firm chair that has side arms. You can use this for support while you get dressed.  Do not have throw rugs and other things on the floor that can make you trip. What can I do in the kitchen?  Clean up any spills right away.  Avoid walking on wet floors.  Keep items that you use a lot in easy-to-reach places.  If you need to reach something above you, use a  strong step stool that has a grab bar.  Keep electrical cords out of the way.  Do not use floor polish or wax that makes floors slippery. If you must use wax, use non-skid floor wax.  Do not have throw rugs and other things on the floor that can make you trip. What can I do with my stairs?  Do not leave any items on the stairs.  Make sure that there are handrails on both sides of the stairs and use them. Fix handrails that are broken or loose. Make sure that handrails are as long as the stairways.  Check any carpeting to make sure that it is firmly attached to the stairs. Fix any carpet that is loose or worn.  Avoid having throw rugs at the top or bottom of the stairs. If you do have throw rugs, attach them to the floor with carpet tape.  Make sure that you have a light switch at the top of the stairs and the bottom of the stairs. If you do not have them, ask someone to add them for you. What else can I do to help prevent  falls?  Wear shoes that:  Do not have high heels.  Have rubber bottoms.  Are comfortable and fit you well.  Are closed at the toe. Do not wear sandals.  If you use a stepladder:  Make sure that it is fully opened. Do not climb a closed stepladder.  Make sure that both sides of the stepladder are locked into place.  Ask someone to hold it for you, if possible.  Clearly mark and make sure that you can see:  Any grab bars or handrails.  First and last steps.  Where the edge of each step is.  Use tools that help you move around (mobility aids) if they are needed. These include:  Canes.  Walkers.  Scooters.  Crutches.  Turn on the lights when you go into a dark area. Replace any light bulbs as soon as they burn out.  Set up your furniture so you have a clear path. Avoid moving your furniture around.  If any of your floors are uneven, fix them.  If there are any pets around you, be aware of where they are.  Review your medicines with your doctor. Some medicines can make you feel dizzy. This can increase your chance of falling. Ask your doctor what other things that you can do to help prevent falls. This information is not intended to replace advice given to you by your health care provider. Make sure you discuss any questions you have with your health care provider. Document Released: 06/04/2009 Document Revised: 01/14/2016 Document Reviewed: 09/12/2014 Elsevier Interactive Patient Education  2017 [employer].

## 2019-09-02 NOTE — Assessment & Plan Note (Signed)
Well-controlled Continue Zoloft 50 mg daily No SI/HI

## 2019-09-02 NOTE — Assessment & Plan Note (Signed)
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

## 2019-09-02 NOTE — Progress Notes (Signed)
Patient: Elaine [name], Female    DOB: [birth year], 79 y.o.   MRN: [id number] Visit Date: 09/02/2019  Today's Provider: Lavon Paganini, MD   Chief Complaint  Patient presents with  . Annual Exam   Subjective:     Complete Physical Elaine [name] is a 79 y.o. female. She feels well. She reports exercising no. She reports she is sleeping well.   Feels like Aricept is helping with her memory some.  Dose was increased to 10mg  after last visit  She is now having loose stools at least 4 times weekly.  Worse sometimes than others. Feels like her whole family is watching over her so closely for any errors.  Feels more irritable. Denies depression symptoms.  She has been taking Pepto-Bismol for the diarrhea, without relief. -----------------------------------------------------------   Review of Systems  Constitutional: Positive for activity change.  HENT: Positive for hearing loss, mouth sores, rhinorrhea and tinnitus.   Eyes: Negative.   Respiratory: Negative.   Cardiovascular: Negative.   Gastrointestinal: Positive for abdominal distention and diarrhea.  Endocrine: Negative.   Genitourinary: Negative.   Musculoskeletal: Negative.   Skin: Negative.   Allergic/Immunologic: Negative.   Neurological: Negative.   Hematological: Negative.   Psychiatric/Behavioral: Positive for agitation.    Social History   Socioeconomic History  . Marital status: Widowed    Spouse name: Not on file  . Number of children: 2  . Years of education: H/S  . Highest education level: High school graduate  Occupational History  . Occupation: Retired    Comment: Psychologist, educational, Retail buyer  Tobacco Use  . Smoking status: Never Smoker  . Smokeless tobacco: Never Used  Substance and Sexual Activity  . Alcohol use: Yes    Alcohol/week: 0.0 - 7.0 standard drinks    Comment: up to 1 drink daily  . Drug use: No  . Sexual activity: Not on file  Other Topics Concern  . Not  on file  Social History Narrative  . Not on file   Social Determinants of Health   Financial Resource Strain: Low Risk   . Difficulty of Paying Living Expenses: Not hard at all  Food Insecurity: No Food Insecurity  . Worried About Charity fundraiser in the Last Year: Never true  . Ran Out of Food in the Last Year: Never true  Transportation Needs: No Transportation Needs  . Lack of Transportation (Medical): No  . Lack of Transportation (Non-Medical): No  Physical Activity: Inactive  . Days of Exercise per Week: 0 days  . Minutes of Exercise per Session: 0 min  Stress: No Stress Concern Present  . Feeling of Stress : Not at all  Social Connections: Somewhat Isolated  . Frequency of Communication with Friends and Family: More than three times a week  . Frequency of Social Gatherings with Friends and Family: More than three times a week  . Attends Religious Services: More than 4 times per year  . Active Member of Clubs or Organizations: No  . Attends Archivist Meetings: Never  . Marital Status: Widowed  Intimate Partner Violence: Not At Risk  . Fear of Current or Ex-Partner: No  . Emotionally Abused: No  . Physically Abused: No  . Sexually Abused: No    Past Medical History:  Diagnosis Date  . Alzheimer disease ([place])   . Depression      Patient Active Problem List   Diagnosis Date Noted  . Mild cognitive impairment with  memory loss 07/10/2019  . Hypercholesteremia 01/22/2016  . Insomnia 09/21/2015  . 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 Arthritis, degenerative 05/28/2015  . 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 Restless leg 05/28/2015  . Rotator cuff syndrome 07/03/2013  . 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 Acid reflux 06/08/2007    Past Surgical History:  Procedure Laterality Date  . ANKLE SURGERY    . AUGMENTATION MAMMAPLASTY Bilateral 2006  . BREAST BIOPSY Right  1984   EXCISIONAL - NEG  . BREAST ENHANCEMENT SURGERY Bilateral   . HEMORRHOID SURGERY  2011   Hemorrhoidectomy  . TONSILLECTOMY AND ADENOIDECTOMY    . TUBAL LIGATION      Her family history includes Breast cancer in her maternal aunt; Heart attack in her father; Hypertension in her sister.   Current Outpatient Medications:  .  aspirin EC 81 MG tablet, Take 81 mg by mouth daily., Disp: , Rfl:  .  donepezil (ARICEPT) 5 MG tablet, Take 1 tablet (5 mg total) by mouth at bedtime., Disp: 30 tablet, Rfl: 3 .  enalapril (VASOTEC) 5 MG tablet, TAKE 1 TABLET BY MOUTH  EVERY DAY, Disp: 90 tablet, Rfl: 3 .  Ibuprofen 200 MG CAPS, Take 1 capsule by mouth as needed. , Disp: , Rfl:  .  sertraline (ZOLOFT) 50 MG tablet, TAKE 1 TABLET BY MOUTH  DAILY, Disp: 90 tablet, Rfl: 3 .  memantine (NAMENDA) 5 MG tablet, Take 1 tablet (5 mg total) by mouth daily., Disp: 30 tablet, Rfl: 2  Patient Care Team: Virginia Crews, MD as PCP - General (Family Medicine) Michaelle Birks, Scarlette Ar, MD as Referring Physician (Cardiology) Pa, [place] as Consulting Physician (Optometry) Sheppard Coil Diona Fanti, MD as Referring Physician     Objective:    Vitals: BP (!) 142/60 (BP Location: Right Arm, Patient Position: Sitting, Cuff Size: Normal)   Pulse 63   Temp (!) 97.1 F (36.2 C) (Oral)   Resp 16   Ht 5\' 3"  (1.6 m)   Wt 143 lb (64.9 kg)   BMI 25.33 kg/m   Physical Exam Vitals reviewed.  Constitutional:      General: She is not in acute distress.    Appearance: Normal appearance. She is well-developed. She is not diaphoretic.  HENT:     Head: Normocephalic and atraumatic.     Right Ear: Tympanic membrane, ear canal and external ear normal.     Left Ear: Tympanic membrane, ear canal and external ear normal.  Eyes:     General: No scleral icterus.    Conjunctiva/sclera: Conjunctivae normal.     Pupils: Pupils are equal, round, and reactive to light.  Neck:     Thyroid: No thyromegaly.    Cardiovascular:     Rate and Rhythm: Normal rate and regular rhythm.     Pulses: Normal pulses.     Heart sounds: Murmur present.  Pulmonary:     Effort: Pulmonary effort is normal. No respiratory distress.     Breath sounds: Normal breath sounds. No wheezing or rales.  Abdominal:     General: There is no distension.     Palpations: Abdomen is soft.     Tenderness: There is no abdominal tenderness.  Musculoskeletal:        General: No deformity.     Cervical back: Neck supple.     Right lower leg: No edema.     Left lower leg: No edema.  Lymphadenopathy:  Cervical: No cervical adenopathy.  Skin:    General: Skin is warm and dry.     Capillary Refill: Capillary refill takes less than 2 seconds.     Findings: No rash.  Neurological:     Mental Status: She is alert and oriented to person, place, and time. Mental status is at baseline.     Gait: Gait normal.  Psychiatric:        Mood and Affect: Mood normal.        Behavior: Behavior normal.        Thought Content: Thought content normal.     Activities of Daily Living In your present state of health, do you have any difficulty performing the following activities: 09/02/2019  Hearing? Y  Comment Wears bilateral hearing aids.  Vision? Y  Comment Wears eye glasses for reading.  Difficulty concentrating or making decisions? Y  Comment Currently on Aricept for memory loss.  Walking or climbing stairs? N  Dressing or bathing? N  Doing errands, shopping? N  Preparing Food and eating ? N  Using the Toilet? N  In the past six months, have you accidently leaked urine? N  Do you have problems with loss of bowel control? Y  Comment Occasionally due to diarrhea from medications.  Managing your Medications? N  Managing your Finances? N  Housekeeping or managing your Housekeeping? N  Some recent data might be hidden    Fall Risk Assessment Fall Risk  09/02/2019 07/09/2019 08/27/2018 08/21/2018 04/14/2017  Falls in the past year?  0 0 1 1 No  Number falls in past yr: 0 0 1 0 -  Injury with Fall? 0 0 1 1 -  Comment - - Compression fracture - -  Follow up - Falls evaluation completed Falls prevention discussed - -     Depression Screen PHQ 2/9 Scores 09/02/2019 09/02/2019 05/08/2019 01/23/2019  PHQ - 2 Score 0 1 3 2   PHQ- 9 Score 3 - 5 5    6CIT Screen 08/02/2016  What Year? 0 points  What month? 0 points  What time? 0 points  Count back from 20 0 points  Months in reverse 0 points  Repeat phrase 2 points  Total Score 2       Assessment & Plan:    Annual Physical Reviewed patient's Family Medical History Reviewed and updated list of patient's medical providers Assessment of cognitive impairment was done Assessed patient's functional ability Established a written schedule for health screening [place] Completed and Reviewed  Exercise Activities and Dietary recommendations Goals    . Exercise 150 minutes per week (moderate activity)    . Increase water intake     Starting 08/02/16, I will increase my water intake to 4 glasses every day.    Marland Kitchen LIFESTYLE - DECREASE FALLS RISK     Recommend to remove any items from the home that may cause slips or trips.        Immunization History  Administered Date(s) Administered  . Influenza, High Dose Seasonal PF 05/31/2019  . Influenza,inj,Quad PF,6+ Mos 08/28/2015  . Influenza,inj,quad, With Preservative 06/14/2017  . Influenza-Unspecified 06/22/2018  . Pneumococcal Conjugate-13 05/28/2014  . Pneumococcal Polysaccharide-23 08/28/2015  . Zoster 11/03/2010  . Zoster Recombinat (Shingrix) 06/12/2019    Health Maintenance  Topic Date Due  . TETANUS/TDAP  09/01/2020 (Originally 03/08/1960)  . INFLUENZA VACCINE  Completed  . DEXA SCAN  Completed  . PNA vac Low Risk Adult  Completed     Discussed  health benefits of physical activity, and encouraged her to engage in regular exercise appropriate for her age and condition.      ------------------------------------------------------------------------------------------------------------  Problem List Items Addressed This Visit      Nervous and Auditory   Mild cognitive impairment with memory loss    Again discussed the labs and MRI results with the patient We have not revealed any secondary causes for her memory loss We again discussed that this is age-related memory loss and early dementia changes She was tolerating Aricept well 5 mg, but is now reporting diarrhea on the 10 mg dose We will decrease back to 5 mg Add Namenda 5 mg daily as well At follow-up next month, we can see how she is doing on this dose We again discussed beers list drugs to avoid, including Xanax which she had previously been using infrequently        Other   Major depressive disorder    Well-controlled Continue Zoloft 50 mg daily No SI/HI      Hypercholesteremia    Patient has previously declined statin therapy Recheck lipid panel      Relevant Orders   Lipid panel   CMP (Comprehensive metabolic panel)    Other Visit Diagnoses    Encounter for annual physical exam    -  Primary   Relevant Orders   Lipid panel   CMP (Comprehensive metabolic panel)       Return in about 4 weeks (around 09/30/2019) for BP and memory f/u.   The entirety of the information documented in the History of Present Illness, Review of Systems and Physical Exam were personally obtained by me. Portions of this information were initially documented by Lynford Humphrey, CMA and reviewed by me for thoroughness and accuracy.    Roniqua Kintz, Dionne Bucy, MD MPH [facility]

## 2019-09-03 ENCOUNTER — Telehealth: Payer: Self-pay

## 2019-09-03 LAB — COMPREHENSIVE METABOLIC PANEL
ALT: 12 IU/L (ref 0–32)
AST: 20 IU/L (ref 0–40)
Albumin/Globulin Ratio: 1.7 (ref 1.2–2.2)
Albumin: 4.2 g/dL (ref 3.7–4.7)
Alkaline Phosphatase: 97 IU/L (ref 39–117)
BUN/Creatinine Ratio: 21 (ref 12–28)
BUN: 15 mg/dL (ref 8–27)
Bilirubin Total: 0.5 mg/dL (ref 0.0–1.2)
CO2: 22 mmol/L (ref 20–29)
Calcium: 9.1 mg/dL (ref 8.7–10.3)
Chloride: 105 mmol/L (ref 96–106)
Creatinine, Ser: 0.72 mg/dL (ref 0.57–1.00)
GFR calc Af Amer: 92 mL/min/{1.73_m2} (ref >59)
GFR calc non Af Amer: 80 mL/min/{1.73_m2} (ref >59)
Globulin, Total: 2.5 g/dL (ref 1.5–4.5)
Glucose: 95 mg/dL (ref 65–99)
Potassium: 4 mmol/L (ref 3.5–5.2)
Sodium: 141 mmol/L (ref 134–144)
Total Protein: 6.7 g/dL (ref 6.0–8.5)

## 2019-09-03 LAB — LIPID PANEL
Chol/HDL Ratio: 5.7 ratio — ABNORMAL HIGH (ref 0.0–4.4)
Cholesterol, Total: 199 mg/dL (ref 100–199)
HDL: 35 mg/dL — ABNORMAL LOW (ref >39)
LDL Chol Calc (NIH): 150 mg/dL — ABNORMAL HIGH (ref 0–99)
Triglycerides: 79 mg/dL (ref 0–149)
VLDL Cholesterol Cal: 14 mg/dL (ref 5–40)

## 2019-09-03 NOTE — Telephone Encounter (Signed)
-----   Message from Virginia Crews, MD sent at 09/03/2019  8:54 AM EST ----- Normal labs. Cholesterol is stable.  I recommend diet low in saturated fat and regular exercise - 30 min at least 5 times per week.

## 2019-09-03 NOTE — Telephone Encounter (Signed)
Pt advised.   Thanks,   -Blaine Hari  

## 2019-09-23 ENCOUNTER — Other Ambulatory Visit: Payer: Self-pay | Admitting: Family Medicine

## 2019-10-03 ENCOUNTER — Encounter: Payer: Self-pay | Admitting: Family Medicine

## 2019-10-03 ENCOUNTER — Other Ambulatory Visit: Payer: Self-pay

## 2019-10-03 ENCOUNTER — Ambulatory Visit (INDEPENDENT_AMBULATORY_CARE_PROVIDER_SITE_OTHER): Payer: Medicare Other | Admitting: Family Medicine

## 2019-10-03 DIAGNOSIS — F3342 Major depressive disorder, recurrent, in full remission: Secondary | ICD-10-CM

## 2019-10-03 DIAGNOSIS — I10 Essential (primary) hypertension: Secondary | ICD-10-CM

## 2019-10-03 DIAGNOSIS — G3184 Mild cognitive impairment, so stated: Secondary | ICD-10-CM | POA: Diagnosis not present

## 2019-10-03 NOTE — Progress Notes (Signed)
Patient: Elaine [name] Female    DOB: June 10, 1941   79 y.o.   MRN: [id number] Visit Date: 10/03/2019  Today's Provider: Lavon Paganini, MD   Chief Complaint  Patient presents with  . Hypertension  . Memory Loss   Subjective:    I [place] S. Dimas, CMA, am acting as scribe for Lavon Paganini, MD.  HPI  Hypertension, follow-up:  BP Readings from Last 3 Encounters:  10/03/19 125/65  09/02/19 (!) 142/60  09/02/19 (!) 142/60    She was last seen for hypertension 1 months ago.  BP at that visit was 142/60. Management changes since that visit include none. She reports excellent compliance with treatment. She is not having side effects.  She is not exercising. She is adherent to low salt diet.   Outside blood pressures is not checked. She is experiencing none.  Patient denies chest pain and lower extremity edema.   Cardiovascular risk factors include advanced age (older than 26 for men, 23 for women) and dyslipidemia.  Use of agents associated with hypertension: none.     Weight trend: stable Wt Readings from Last 3 Encounters:  10/03/19 143 lb (64.9 kg)  09/02/19 143 lb (64.9 kg)  09/02/19 143 lb (64.9 kg)    Current diet: in general, a "healthy" diet    ------------------------------------------------------------------------   Follow up for mild cognitive impairment with memory loss  The patient was last seen for this 1 months ago. Changes made at last visit include decrease back to Aricept 5 mg and add Namenda 5 mg daily.  She reports excellent compliance with treatment. She feels that condition is Improved. She is not having side effects.    MMSE - Mini Mental State Exam 10/03/2019 07/09/2019 05/08/2019  Orientation to time 5 4 3   Orientation to Place 5 4 4   Registration 3 3 3   Attention/ Calculation 5 5 5   Recall 3 2 1   Language- name 2 objects 2 2 2   Language- repeat 1 1 1   Language- follow 3 step command 3 3 3   Language- read &  follow direction 1 1 1   Write a sentence 1 1 1   Copy design 1 1 1   Total score 30 27 25     ------------------------------------------------------------------------------------    No Known Allergies   Current Outpatient Medications:  .  aspirin EC 81 MG tablet, Take 81 mg by mouth daily., Disp: , Rfl:  .  donepezil (ARICEPT) 5 MG tablet, Take 1 tablet (5 mg total) by mouth at bedtime., Disp: 30 tablet, Rfl: 3 .  enalapril (VASOTEC) 5 MG tablet, TAKE 1 TABLET BY MOUTH  EVERY DAY, Disp: 90 tablet, Rfl: 3 .  Ibuprofen 200 MG CAPS, Take 1 capsule by mouth as needed. , Disp: , Rfl:  .  memantine (NAMENDA) 5 MG tablet, Take 1 tablet (5 mg total) by mouth daily., Disp: 30 tablet, Rfl: 2 .  sertraline (ZOLOFT) 50 MG tablet, TAKE 1 TABLET BY MOUTH  DAILY, Disp: 90 tablet, Rfl: 3  Review of Systems  Constitutional: Negative.   Respiratory: Negative.   Cardiovascular: Negative.   Neurological: Negative.   Psychiatric/Behavioral: Negative.     Social History   Tobacco Use  . Smoking status: Never Smoker  . Smokeless tobacco: Never Used  Substance Use Topics  . Alcohol use: Yes    Alcohol/week: 0.0 - 7.0 standard drinks    Comment: up to 1 drink daily      Objective:   BP 125/65 (BP  Location: Left Arm, Patient Position: Sitting, Cuff Size: Normal)   Pulse (!) 58   Temp (!) 97.3 F (36.3 C) (Temporal)   Resp 16   Wt 143 lb (64.9 kg)   BMI 25.33 kg/m  Vitals:   10/03/19 1053  BP: 125/65  Pulse: (!) 58  Resp: 16  Temp: (!) 97.3 F (36.3 C)  TempSrc: Temporal  Weight: 143 lb (64.9 kg)  Body mass index is 25.33 kg/m.   Physical Exam Vitals reviewed.  Constitutional:      General: She is not in acute distress.    Appearance: Normal appearance. She is well-developed. She is not diaphoretic.  HENT:     Head: Normocephalic and atraumatic.  Eyes:     General: No scleral icterus.    Conjunctiva/sclera: Conjunctivae normal.  Neck:     Thyroid: No thyromegaly.    Cardiovascular:     Rate and Rhythm: Normal rate and regular rhythm.     Heart sounds: Normal heart sounds. No murmur.  Pulmonary:     Effort: Pulmonary effort is normal. No respiratory distress.     Breath sounds: Normal breath sounds. No wheezing, rhonchi or rales.  Musculoskeletal:     Right lower leg: No edema.     Left lower leg: No edema.  Neurological:     Mental Status: She is alert and oriented to person, place, and time. Mental status is at baseline.  Psychiatric:        Mood and Affect: Mood normal.        Behavior: Behavior normal.      No results found for any visits on 10/03/19.     Assessment & Plan    Problem List Items Addressed This Visit      Cardiovascular and Mediastinum   Hypertension    Well controlled Continue current medications Recheck metabolic panel F/u in 6 months         Nervous and Auditory   Mild cognitive impairment with memory loss    Seems to have stabilized We again discussed that this is age-related memory loss and early dementia changes Continue Aricept 5 mg daily and Namenda 5 mg daily She is tolerating these well We could consider increasing Namenda dose in the future She is avoiding beers list drugs as we have discussed previously We did discuss the natural course of this We also discussed memory strategies        Other   Major depressive disorder    Chronic and well-controlled Continue Zoloft 50 mg daily Contracted for safety-no SI/HI We discussed that there is no evidence to state that this would worsen her memory status, but if she did have uncontrolled depression, that could worsen her memory status          Return in about 5 months (around 03/01/2020) for chronic disease f/u.   The entirety of the information documented in the History of Present Illness, Review of Systems and Physical Exam were personally obtained by me. Portions of this information were initially documented by Lynford Humphrey, CMA and  reviewed by me for thoroughness and accuracy.    Ravinder Lukehart, Dionne Bucy, MD MPH [facility]

## 2019-10-03 NOTE — Patient Instructions (Addendum)
[email or web address]  [email or web address]  These are some sites to look into for possible research studies  Memory Compensation Strategies  1. Use "WARM" strategy.  W= write it down  A= associate it  R= repeat it  M= make a mental note  2.   You can keep a Social worker.  Use a 3-ring notebook with sections for the following: calendar, important names and phone numbers,  medications, doctors' names/phone numbers, lists/reminders, and a section to journal what you did  each day.   3.    Use a calendar to write appointments down.  4.    Write yourself a schedule for the day.  This can be placed on the calendar or in a separate section of the Memory Notebook.  Keeping a  regular schedule can help memory.  5.    Use medication organizer with sections for each day or morning/evening pills.  You may need help loading it  6.    Keep a basket, or pegboard by the door.  Place items that you need to take out with you in the basket or on the pegboard.  You may also want to  include a message board for reminders.  7.    Use sticky notes.  Place sticky notes with reminders in a place where the task is performed.  For example: " turn off the  stove" placed by the stove, "lock the door" placed on the door at eye level, " take your medications" on  the bathroom mirror or by the place where you normally take your medications.  8.    Use alarms/timers.  Use while cooking to remind yourself to check on food or as a reminder to take your medicine, or as a  reminder to make a call, or as a reminder to perform another task, etc.

## 2019-10-04 NOTE — Assessment & Plan Note (Signed)
Seems to have stabilized We again discussed that this is age-related memory loss and early dementia changes Continue Aricept 5 mg daily and Namenda 5 mg daily She is tolerating these well We could consider increasing Namenda dose in the future She is avoiding beers list drugs as we have discussed previously We did discuss the natural course of this We also discussed memory strategies

## 2019-10-04 NOTE — Assessment & Plan Note (Signed)
Chronic and well-controlled Continue Zoloft 50 mg daily Contracted for safety-no SI/HI We discussed that there is no evidence to state that this would worsen her memory status, but if she did have uncontrolled depression, that could worsen her memory status

## 2019-10-04 NOTE — Assessment & Plan Note (Signed)
Well controlled Continue current medications Recheck metabolic panel F/u in 6 months  

## 2019-10-07 ENCOUNTER — Telehealth: Payer: Self-pay

## 2019-10-07 NOTE — Telephone Encounter (Signed)
The websites that we talked about were typed into the AVS and it was printed for her.  You should be able to read these off to her if she has lost the paper

## 2019-10-07 NOTE — Telephone Encounter (Signed)
Please advise 

## 2019-10-07 NOTE — Telephone Encounter (Signed)
Copied from [place] [id number]. Topic: General - Other >> Oct 07, 2019 11:21 AM [name] wrote: Reason for CRM: Pt called stating that her last appt she spoke with PCP regarding studies in the area of alzheimer. Pt states that PCP said she would give her info. Pt is requesting to pick up this info. Please advise.

## 2019-10-07 NOTE — Telephone Encounter (Signed)
Patient advised as below.  

## 2019-10-08 ENCOUNTER — Emergency Department: Payer: Medicare Other

## 2019-10-08 ENCOUNTER — Emergency Department
Admission: EM | Admit: 2019-10-08 | Discharge: 2019-10-08 | Disposition: A | Discharge status: Other Acute Inpatient Hospital | Payer: Medicare Other | Attending: Student | Admitting: Student

## 2019-10-08 ENCOUNTER — Other Ambulatory Visit: Payer: Self-pay

## 2019-10-08 DIAGNOSIS — S065X0A Traumatic subdural hemorrhage without loss of consciousness, initial encounter: Secondary | ICD-10-CM | POA: Diagnosis not present

## 2019-10-08 DIAGNOSIS — I6201 Nontraumatic acute subdural hemorrhage: Secondary | ICD-10-CM | POA: Diagnosis not present

## 2019-10-08 DIAGNOSIS — Z79899 Other long term (current) drug therapy: Secondary | ICD-10-CM | POA: Diagnosis not present

## 2019-10-08 DIAGNOSIS — W01198A Fall on same level from slipping, tripping and stumbling with subsequent striking against other object, initial encounter: Secondary | ICD-10-CM | POA: Diagnosis not present

## 2019-10-08 DIAGNOSIS — I619 Nontraumatic intracerebral hemorrhage, unspecified: Secondary | ICD-10-CM

## 2019-10-08 DIAGNOSIS — Y9389 Activity, other specified: Secondary | ICD-10-CM | POA: Diagnosis not present

## 2019-10-08 DIAGNOSIS — G309 Alzheimer's disease, unspecified: Secondary | ICD-10-CM | POA: Diagnosis not present

## 2019-10-08 DIAGNOSIS — R001 Bradycardia, unspecified: Secondary | ICD-10-CM | POA: Diagnosis not present

## 2019-10-08 DIAGNOSIS — M47812 Spondylosis without myelopathy or radiculopathy, cervical region: Secondary | ICD-10-CM | POA: Diagnosis not present

## 2019-10-08 DIAGNOSIS — S06360A Traumatic hemorrhage of cerebrum, unspecified, without loss of consciousness, initial encounter: Secondary | ICD-10-CM | POA: Insufficient documentation

## 2019-10-08 DIAGNOSIS — Z7409 Other reduced mobility: Secondary | ICD-10-CM | POA: Diagnosis not present

## 2019-10-08 DIAGNOSIS — E785 Hyperlipidemia, unspecified: Secondary | ICD-10-CM | POA: Diagnosis not present

## 2019-10-08 DIAGNOSIS — S06340A Traumatic hemorrhage of right cerebrum without loss of consciousness, initial encounter: Secondary | ICD-10-CM | POA: Diagnosis not present

## 2019-10-08 DIAGNOSIS — S0990XA Unspecified injury of head, initial encounter: Secondary | ICD-10-CM | POA: Diagnosis not present

## 2019-10-08 DIAGNOSIS — Z743 Need for continuous supervision: Secondary | ICD-10-CM | POA: Diagnosis not present

## 2019-10-08 DIAGNOSIS — W19XXXA Unspecified fall, initial encounter: Secondary | ICD-10-CM | POA: Diagnosis not present

## 2019-10-08 DIAGNOSIS — S3993XA Unspecified injury of pelvis, initial encounter: Secondary | ICD-10-CM | POA: Diagnosis not present

## 2019-10-08 DIAGNOSIS — S199XXA Unspecified injury of neck, initial encounter: Secondary | ICD-10-CM | POA: Diagnosis not present

## 2019-10-08 DIAGNOSIS — Y999 Unspecified external cause status: Secondary | ICD-10-CM | POA: Insufficient documentation

## 2019-10-08 DIAGNOSIS — I4891 Unspecified atrial fibrillation: Secondary | ICD-10-CM | POA: Diagnosis not present

## 2019-10-08 DIAGNOSIS — S065X9A Traumatic subdural hemorrhage with loss of consciousness of unspecified duration, initial encounter: Secondary | ICD-10-CM

## 2019-10-08 DIAGNOSIS — S299XXA Unspecified injury of thorax, initial encounter: Secondary | ICD-10-CM | POA: Diagnosis not present

## 2019-10-08 DIAGNOSIS — S065XAA Traumatic subdural hemorrhage with loss of consciousness status unknown, initial encounter: Secondary | ICD-10-CM

## 2019-10-08 DIAGNOSIS — I10 Essential (primary) hypertension: Secondary | ICD-10-CM | POA: Insufficient documentation

## 2019-10-08 DIAGNOSIS — R110 Nausea: Secondary | ICD-10-CM | POA: Insufficient documentation

## 2019-10-08 DIAGNOSIS — R40241 Glasgow coma scale score 13-15, unspecified time: Secondary | ICD-10-CM | POA: Diagnosis not present

## 2019-10-08 DIAGNOSIS — R918 Other nonspecific abnormal finding of lung field: Secondary | ICD-10-CM | POA: Diagnosis not present

## 2019-10-08 DIAGNOSIS — Z20822 Contact with and (suspected) exposure to covid-19: Secondary | ICD-10-CM | POA: Diagnosis not present

## 2019-10-08 DIAGNOSIS — S065X1A Traumatic subdural hemorrhage with loss of consciousness of 30 minutes or less, initial encounter: Secondary | ICD-10-CM | POA: Diagnosis not present

## 2019-10-08 DIAGNOSIS — S02119A Unspecified fracture of occiput, initial encounter for closed fracture: Secondary | ICD-10-CM | POA: Diagnosis not present

## 2019-10-08 DIAGNOSIS — R531 Weakness: Secondary | ICD-10-CM | POA: Diagnosis not present

## 2019-10-08 DIAGNOSIS — S06350A Traumatic hemorrhage of left cerebrum without loss of consciousness, initial encounter: Secondary | ICD-10-CM | POA: Diagnosis not present

## 2019-10-08 DIAGNOSIS — Z03818 Encounter for observation for suspected exposure to other biological agents ruled out: Secondary | ICD-10-CM | POA: Diagnosis not present

## 2019-10-08 DIAGNOSIS — Y929 Unspecified place or not applicable: Secondary | ICD-10-CM | POA: Insufficient documentation

## 2019-10-08 DIAGNOSIS — R0902 Hypoxemia: Secondary | ICD-10-CM | POA: Diagnosis not present

## 2019-10-08 DIAGNOSIS — G935 Compression of brain: Secondary | ICD-10-CM | POA: Diagnosis not present

## 2019-10-08 DIAGNOSIS — M8580 Other specified disorders of bone density and structure, unspecified site: Secondary | ICD-10-CM | POA: Diagnosis not present

## 2019-10-08 LAB — URINALYSIS, COMPLETE (UACMP) WITH MICROSCOPIC
Bacteria, UA: NONE SEEN
Bilirubin Urine: NEGATIVE
Glucose, UA: NEGATIVE mg/dL
Ketones, ur: NEGATIVE mg/dL
Leukocytes,Ua: NEGATIVE
Nitrite: NEGATIVE
Protein, ur: 30 mg/dL — AB
Specific Gravity, Urine: 1.008 (ref 1.005–1.030)
Squamous Epithelial / HPF: NONE SEEN (ref 0–5)
pH: 7 (ref 5.0–8.0)

## 2019-10-08 LAB — TROPONIN I (HIGH SENSITIVITY): Troponin I (High Sensitivity): 4 ng/L (ref <18)

## 2019-10-08 LAB — COMPREHENSIVE METABOLIC PANEL
ALT: 19 U/L (ref 0–44)
AST: 39 U/L (ref 15–41)
Albumin: 4.3 g/dL (ref 3.5–5.0)
Alkaline Phosphatase: 81 U/L (ref 38–126)
Anion gap: 9 (ref 5–15)
BUN: 20 mg/dL (ref 8–23)
CO2: 25 mmol/L (ref 22–32)
Calcium: 8.8 mg/dL — ABNORMAL LOW (ref 8.9–10.3)
Chloride: 103 mmol/L (ref 98–111)
Creatinine, Ser: 0.66 mg/dL (ref 0.44–1.00)
GFR calc Af Amer: >60 mL/min (ref >60)
GFR calc non Af Amer: >60 mL/min (ref >60)
Glucose, Bld: 121 mg/dL — ABNORMAL HIGH (ref 70–99)
Potassium: 4.1 mmol/L (ref 3.5–5.1)
Sodium: 137 mmol/L (ref 135–145)
Total Bilirubin: 1 mg/dL (ref 0.3–1.2)
Total Protein: 7.4 g/dL (ref 6.5–8.1)

## 2019-10-08 LAB — CBC WITH DIFFERENTIAL/PLATELET
Abs Immature Granulocytes: 0.04 10*3/uL (ref 0.00–0.07)
Basophils Absolute: 0 10*3/uL (ref 0.0–0.1)
Basophils Relative: 0 %
Eosinophils Absolute: 0.1 10*3/uL (ref 0.0–0.5)
Eosinophils Relative: 1 %
HCT: 39.7 % (ref 36.0–46.0)
Hemoglobin: 13.2 g/dL (ref 12.0–15.0)
Immature Granulocytes: 1 %
Lymphocytes Relative: 9 %
Lymphs Abs: 0.8 10*3/uL (ref 0.7–4.0)
MCH: 30.7 pg (ref 26.0–34.0)
MCHC: 33.2 g/dL (ref 30.0–36.0)
MCV: 92.3 fL (ref 80.0–100.0)
Monocytes Absolute: 0.3 10*3/uL (ref 0.1–1.0)
Monocytes Relative: 4 %
Neutro Abs: 7.3 10*3/uL (ref 1.7–7.7)
Neutrophils Relative %: 85 %
Platelets: 160 10*3/uL (ref 150–400)
RBC: 4.3 MIL/uL (ref 3.87–5.11)
RDW: 12.2 % (ref 11.5–15.5)
WBC: 8.5 10*3/uL (ref 4.0–10.5)
nRBC: 0 % (ref 0.0–0.2)

## 2019-10-08 LAB — RESPIRATORY PANEL BY RT PCR (FLU A&B, COVID)
Influenza A by PCR: NEGATIVE
Influenza B by PCR: NEGATIVE
SARS Coronavirus 2 by RT PCR: NEGATIVE

## 2019-10-08 LAB — TYPE AND SCREEN
ABO/RH(D): O POS
Antibody Screen: NEGATIVE

## 2019-10-08 MED ORDER — ONDANSETRON HCL 4 MG/2ML IJ SOLN
INTRAMUSCULAR | Status: AC
  Administered 2019-10-08: 4 mg via INTRAVENOUS
  Filled 2019-10-08: qty 2

## 2019-10-08 MED ORDER — LEVETIRACETAM IN NACL 500 MG/100ML IV SOLN
500.0000 mg | Freq: Once | INTRAVENOUS | Status: AC
  Administered 2019-10-08: 16:00:00 500 mg via INTRAVENOUS
  Filled 2019-10-08: qty 100

## 2019-10-08 MED ORDER — ONDANSETRON HCL 4 MG/2ML IJ SOLN: 4.0000 mg | Freq: Once | INTRAMUSCULAR | Status: AC

## 2019-10-08 NOTE — ED Notes (Signed)
Report given to Annie, RN

## 2019-10-08 NOTE — ED Triage Notes (Signed)
Pt arrives via [facility] from home for a fall at home, pt reports she does not remember what happened but states she did not hit her head. Pt A&Ox4 and in NAD, reports slight headache.

## 2019-10-08 NOTE — ED Notes (Signed)
XRAY  POWERSHARE  WITH  [facility] 

## 2019-10-08 NOTE — ED Notes (Signed)
EMTALA reviewed by charge RN 

## 2019-10-08 NOTE — ED Notes (Signed)
[place]

## 2019-10-08 NOTE — Consult Note (Signed)
Neurosurgery-New Consultation Evaluation 10/08/2019 Elaine [name] [id number]  Identifying Statement: Elaine [name] is a 79 y.o. female from [place] [id number] with subdural hematoma  Physician Requesting Consultation: [facility]  History of Present Illness: Elaine [name] is here for evaluation after reported fall at home.  She denies hitting her head or other trauma but imaging of the head revealed a large acute subdural hematoma and a small IPH in the left frontal area.  She does not report any use of antiplatelet or anticoagulant medication.  She has no previous significant medical history other than mild dementia.  On her arrival to the emergency department she was reported GCS 15.  On my exam, she appears more sleepy but is cooperative with the exam.    Past Medical History:  Past Medical History:  Diagnosis Date  . Alzheimer disease ([place])   . Depression     Social History: Social History   Socioeconomic History  . Marital status: Widowed    Spouse name: Not on file  . Number of children: 2  . Years of education: H/S  . Highest education level: High school graduate  Occupational History  . Occupation: Retired    Comment: Psychologist, educational, Retail buyer  Tobacco Use  . Smoking status: Never Smoker  . Smokeless tobacco: Never Used  Substance and Sexual Activity  . Alcohol use: Yes    Alcohol/week: 0.0 - 7.0 standard drinks    Comment: up to 1 drink daily  . Drug use: No  . Sexual activity: Not on file  Other Topics Concern  . Not on file  Social History Narrative  . Not on file   Social Determinants of Health   Financial Resource Strain: Low Risk   . Difficulty of Paying Living Expenses: Not hard at all  Food Insecurity: No Food Insecurity  . Worried About Charity fundraiser in the Last Year: Never true  . Ran Out of Food in the Last Year: Never true  Transportation Needs: No Transportation Needs  . Lack of Transportation  (Medical): No  . Lack of Transportation (Non-Medical): No  Physical Activity: Inactive  . Days of Exercise per Week: 0 days  . Minutes of Exercise per Session: 0 min  Stress: No Stress Concern Present  . Feeling of Stress : Not at all  Social Connections: Somewhat Isolated  . Frequency of Communication with Friends and Family: More than three times a week  . Frequency of Social Gatherings with Friends and Family: More than three times a week  . Attends Religious Services: More than 4 times per year  . Active Member of Clubs or Organizations: No  . Attends Archivist Meetings: Never  . Marital Status: Widowed  Intimate Partner Violence: Not At Risk  . Fear of Current or Ex-Partner: No  . Emotionally Abused: No  . Physically Abused: No  . Sexually Abused: No    Family History: Family History  Problem Relation Age of Onset  . Heart attack Father   . Hypertension Sister        pulmonary HTN  . Breast cancer Maternal Aunt     Review of Systems:  Review of Systems - General ROS: Positive for headache Psychological ROS: Negative Ophthalmic ROS: Negative ENT ROS: Negative Hematological and Lymphatic ROS: Negative  Endocrine ROS: Negative Respiratory ROS: Negative Cardiovascular ROS: Negative Gastrointestinal ROS: Negative Genito-Urinary ROS: Negative Musculoskeletal ROS: Negative Neurological ROS: Negative Dermatological ROS: Negative  Physical Exam: BP (!) 144/79  Pulse 99   Temp 97.6 F (36.4 C) (Oral)   Resp 19   Ht 5\' 5"  (1.651 m)   Wt 54.4 kg   SpO2 93%   BMI 19.97 kg/m  Body mass index is 19.97 kg/m. Body surface area is 1.58 meters squared. General appearance: Alert, cooperative, in no acute distress Head: Normocephalic, atraumatic Eyes: Normal, EOM intact Neck: Supple, no tenderness Ext: No edema in LE bilaterally  Neurologic exam:  GCS 13: Motor: 5, eye-opening: 3, speech: 4  Mental status: alertness: alert, oriented to name, would not  answer other orientation questions affect: normal Speech: Clear on yes or no questions, would not respond to other prompts Cranial nerves:  III/IV/VI: extra-ocular motions intact bilaterally V/VII:no evidence of facial droop or weakness  Motor:strength symmetric 5/5 in bilateral grip and hip flexion, not cooperative for the remainder of testing Sensory: Unable to test Gait: Unable to test  Laboratory: Results for orders placed or performed during the hospital encounter of 10/08/19  Comprehensive metabolic panel  Result Value Ref Range   Sodium 137 135 - 145 mmol/L   Potassium 4.1 3.5 - 5.1 mmol/L   Chloride 103 98 - 111 mmol/L   CO2 25 22 - 32 mmol/L   Glucose, Bld 121 (H) 70 - 99 mg/dL   BUN 20 8 - 23 mg/dL   Creatinine, Ser 0.66 0.44 - 1.00 mg/dL   Calcium 8.8 (L) 8.9 - 10.3 mg/dL   Total Protein 7.4 6.5 - 8.1 g/dL   Albumin 4.3 3.5 - 5.0 g/dL   AST 39 15 - 41 U/L   ALT 19 0 - 44 U/L   Alkaline Phosphatase 81 38 - 126 U/L   Total Bilirubin 1.0 0.3 - 1.2 mg/dL   GFR calc non Af Amer >60 >60 mL/min   GFR calc Af Amer >60 >60 mL/min   Anion gap 9 5 - 15  CBC with Differential  Result Value Ref Range   WBC 8.5 4.0 - 10.5 K/uL   RBC 4.30 3.87 - 5.11 MIL/uL   Hemoglobin 13.2 12.0 - 15.0 g/dL   HCT 39.7 36.0 - 46.0 %   MCV 92.3 80.0 - 100.0 fL   MCH 30.7 26.0 - 34.0 pg   MCHC 33.2 30.0 - 36.0 g/dL   RDW 12.2 11.5 - 15.5 %   Platelets 160 150 - 400 K/uL   nRBC 0.0 0.0 - 0.2 %   Neutrophils Relative % 85 %   Neutro Abs 7.3 1.7 - 7.7 K/uL   Lymphocytes Relative 9 %   Lymphs Abs 0.8 0.7 - 4.0 K/uL   Monocytes Relative 4 %   Monocytes Absolute 0.3 0.1 - 1.0 K/uL   Eosinophils Relative 1 %   Eosinophils Absolute 0.1 0.0 - 0.5 K/uL   Basophils Relative 0 %   Basophils Absolute 0.0 0.0 - 0.1 K/uL   Immature Granulocytes 1 %   Abs Immature Granulocytes 0.04 0.00 - 0.07 K/uL  Urinalysis, Complete w Microscopic  Result Value Ref Range   Color, Urine STRAW (A) YELLOW    APPearance CLEAR (A) CLEAR   Specific Gravity, Urine 1.008 1.005 - 1.030   pH 7.0 5.0 - 8.0   Glucose, UA NEGATIVE NEGATIVE mg/dL   Hgb urine dipstick SMALL (A) NEGATIVE   Bilirubin Urine NEGATIVE NEGATIVE   Ketones, ur NEGATIVE NEGATIVE mg/dL   Protein, ur 30 (A) NEGATIVE mg/dL   Nitrite NEGATIVE NEGATIVE   Leukocytes,Ua NEGATIVE NEGATIVE   RBC / HPF 11-20 0 -  5 RBC/hpf   WBC, UA 0-5 0 - 5 WBC/hpf   Bacteria, UA NONE SEEN NONE SEEN   Squamous Epithelial / LPF NONE SEEN 0 - 5   Mucus PRESENT   Troponin I (High Sensitivity)  Result Value Ref Range   Troponin I (High Sensitivity) 4 <18 ng/L   I personally reviewed labs  Imaging: CT head:Left subdural hematoma is mainly acute with a small chronic appearing component present. The hematoma causes 1.2 cm of left right midline shift and effacement of the left temporal tip.  Small amount of subdural blood along the anterior aspect of the falx on the right. Focal hematoma in the inferior left frontal lobe is also identified.  Hemorrhage in the right sphenoid worrisome for occult skull base fracture. Maxillofacial CT scan is recommended for further evaluation.  No acute abnormality cervical spine.  Impression/Plan:  I have discussed Elaine [name] with the emergency department and recommend urgent transfer to higher level facility for definitive care of her subdural hematoma.  Given the mass-effect and size, I do think surgical intervention is needed.  Recommended checking coagulation profile to ensure there is no abnormalities.  Given no history of antiplatelet therapy, no transfusion is needed at this time.  I did discuss giving Keppra 500 mg for seizure prevention.  Recommend keeping her head of bed up and frequent neuro monitoring.  If she should decline in GCS and no longer following commands, would recommend intubation and mannitol for treatment of ICP at that time.  We did discuss blood pressure management and recommended systolic  blood pressure less than 160.  Unsure if history of trauma, patient should be placed in a cervical collar  1.  Diagnosis: Acute subdural hematoma  2.  Plan -Urgent transfer to a higher level facility for surgical management -Maintain C-spine precautions given unknown history of trauma -Keppra for seizure prophylaxis -If patient should decline from a GCS standpoint, recommend intubation and mannitol

## 2019-10-08 NOTE — ED Notes (Signed)
Pt unable to sign for self due to altered mental status. Pt daughter [facility]) gives verbal consent for pt transfer to [facility]. Pt daughter verbalizes understanding of risks/benefits of transfer. No further questions at this time.

## 2019-10-08 NOTE — ED Notes (Signed)
[facility]  CALLED PER  DR  MONKS  MD

## 2019-10-08 NOTE — ED Notes (Signed)
[place]

## 2019-10-08 NOTE — ED Provider Notes (Addendum)
[facility] Emergency Department Provider Note  ____________________________________________   First MD Initiated Contact with Patient 10/08/19 1352     (approximate)  I have reviewed the triage vital signs and the nursing notes.  History  Chief Complaint Fall    HPI Elaine [name] is a 79 y.o. female with hx of mild cognitive impairment, depression, HTN who presents to the ED for an unwitnessed fall.   Patient somewhat confused regarding the surrounding events, unable to provide detailed history. She is complaining of a headache (7/10 severity) and mild nausea, but unable to describe further.   Further history obtained via phone from patient's daughter and patient's significant other. Patient's significant other heard patient fall from the other room. Found her on the floor laying on her back. He thinks she may have slipped on the wooden floor and fell backwards (though patient cannot corroborate it).   She has some baseline mild early dementia, but per family is still independent and functional aside from some mild intermittent confusion.   She is not on any blood thinning medications.   Caveat: hx somewhat limited due to confusion.    Past Medical Hx Past Medical History:  Diagnosis Date  . Alzheimer disease ([place])   . Depression     Problem List Patient Active Problem List   Diagnosis Date Noted  . Mild cognitive impairment with memory loss 07/10/2019  . Hypercholesteremia 01/22/2016  . Insomnia 09/21/2015  . 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 Arthritis, degenerative 05/28/2015  . 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 Restless leg 05/28/2015  . Rotator cuff syndrome 07/03/2013  . 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 Acid reflux 06/08/2007  . Hypertension 07/26/2005    Past Surgical Hx Past Surgical History:  Procedure Laterality Date  . ANKLE  SURGERY    . AUGMENTATION MAMMAPLASTY Bilateral 2006  . BREAST BIOPSY Right 1984   EXCISIONAL - NEG  . BREAST ENHANCEMENT SURGERY Bilateral   . HEMORRHOID SURGERY  2011   Hemorrhoidectomy  . TONSILLECTOMY AND ADENOIDECTOMY    . TUBAL LIGATION      Medications Prior to Admission medications   Medication Sig Start Date End Date Taking? Authorizing Provider  aspirin EC 81 MG tablet Take 81 mg by mouth daily.    [provider]  donepezil (ARICEPT) 5 MG tablet Take 1 tablet (5 mg total) by mouth at bedtime. 09/02/19   Virginia Crews, MD  enalapril (VASOTEC) 5 MG tablet TAKE 1 TABLET BY MOUTH  EVERY DAY 07/01/19   Bacigalupo, Dionne Bucy, MD  Ibuprofen 200 MG CAPS Take 1 capsule by mouth as needed.     [provider]  memantine (NAMENDA) 5 MG tablet Take 1 tablet (5 mg total) by mouth daily. 09/02/19   Virginia Crews, MD  sertraline (ZOLOFT) 50 MG tablet TAKE 1 TABLET BY MOUTH  DAILY 07/01/19   Bacigalupo, Dionne Bucy, MD    Allergies Patient has no known allergies.  Family Hx Family History  Problem Relation Age of Onset  . Heart attack Father   . Hypertension Sister        pulmonary HTN  . Breast cancer Maternal Aunt     Social Hx Social History   Tobacco Use  . Smoking status: Never Smoker  . Smokeless tobacco: Never Used  Substance Use Topics  . Alcohol use: Yes    Alcohol/week: 0.0 - 7.0 standard drinks  Comment: up to 1 drink daily  . Drug use: No     Review of Systems  Constitutional: Negative for fever, chills. Eyes: Negative for visual changes. ENT: Negative for sore throat. Cardiovascular: Negative for chest pain. Respiratory: Negative for shortness of breath. Gastrointestinal: Positive for nausea. Genitourinary: Negative for dysuria. Musculoskeletal: Negative for leg swelling. Skin: Negative for rash. Neurological: Positive for headaches.  Note: ROS somewhat limited due to patient's confusion.    Physical Exam  Vital  Signs: ED Triage Vitals  Enc Vitals Group     BP 10/08/19 1342 (!) 152/87     Pulse Rate 10/08/19 1342 73     Resp 10/08/19 1342 18     Temp 10/08/19 1342 97.6 F (36.4 C)     Temp Source 10/08/19 1342 Oral     SpO2 10/08/19 1342 95 %     Weight 10/08/19 1343 120 lb (54.4 kg)     Height 10/08/19 1343 5\' 5"  (1.651 m)     Head Circumference --      Peak Flow --      Pain Score 10/08/19 1343 7     Pain Loc --      Pain Edu? --      Excl. in [place]? --     Constitutional: Oriented to self and DOB. Confused regarding history. Not oriented to time or place. Somewhat groggy but awakens very easily to voice.  Head: Normocephalic. No lacerations or hematomas.  Eyes: Conjunctivae clear. Sclera anicteric.  PERRL. EOMI.  Nose: No epistaxis.  Mouth/Throat: Wearing mask. No intraoral or dental trauma.  Neck: No stridor.  No midline CS tenderness. Cardiovascular: Irregular. Extremities well perfused. Chest: Chest wall stable, nontender. Respiratory: Normal respiratory effort.  Lungs CTAB. Gastrointestinal: Soft. Non-tender. Non-distended.  Musculoskeletal: No lower extremity edema. No deformities.  From bilateral shoulders, elbows, wrists, hips, knees, ankles. Back: No midline C, T, L-spine tenderness.   Neurologic:  GCS 13. E-3, V-4, M-6. Off for confusion & eyes closed, though eyes open easily to verbal command.  Follows commands appropriately.  No lateralizing deficits. Slightly groggy but awakens easily to voice.  Skin: Skin is warm, dry and intact.  Psychiatric: Mood and affect are appropriate for situation.  EKG  Personally reviewed.   Rate: 113 Rhythm: atrial fibrillation Axis: normal Intervals: WNL  Atrial fibrillation No STEMI  On chart review, no prior hx of AF.  Radiology  CT head/CS: IMPRESSION:  Left subdural hematoma is mainly acute with a small chronic appearing component present. The hematoma causes 1.2 cm of left right midline shift and effacement of the left  temporal tip.   Small amount of subdural blood along the anterior aspect of the falx on the right. Focal hematoma in the inferior left frontal lobe is also identified.   Hemorrhage in the right sphenoid worrisome for occult skull base fracture. Maxillofacial CT scan is recommended for further evaluation.   No acute abnormality cervical spine.   ADDENDUM: This addendum is given as reconstructed images through the sphenoid sinuses or created for evaluation for possible fracture.  A small focus of pneumocephalus is seen adjacent to the superior wall of the right sphenoid sinus. No displaced fracture is identified. 2 very subtle cortical defects are seen in the superiorand superolateral walls of the sphenoid sinus which may be due to fractures.   Procedures  Procedure(s) performed (including critical care):  .Critical Care Performed by: Lilia Pro., MD Authorized by: Lilia Pro., MD   Critical care provider  statement:    Critical care time (minutes):  45   Critical care was necessary to treat or prevent imminent or life-threatening deterioration of the following conditions:  CNS failure or compromise and trauma   Critical care was time spent personally by me on the following activities:  Discussions with consultants, evaluation of patient's response to treatment, examination of patient, ordering and performing treatments and interventions, ordering and review of laboratory studies, ordering and review of radiographic studies, pulse oximetry, re-evaluation of patient's condition, obtaining history from patient or surrogate and review of old charts     Initial Impression / Assessment and Plan / ED Course  79 y.o. female who presents to the ED for unwitnessed fall with related head trauma.  Exam as above.  Will obtain imaging to evaluate for any related acute traumatic injuries.  Labs, urine studies, EKG to evaluate for any underlying precipitating etiology for her  fall.  Imaging notable for a left subdural hematoma, collection measures up to 1.5 cm in thickness, with 1.2 cm midline shift.  Also notable for focal intraparenchymal hematoma in the left frontal lobe.  Discussed case with Neurosurgery, advised transfer to trauma & neurosurgery capable facility.  Also advised dose of IV Keppra, 500 mg ordered. Maintain SBP 160/90 or less, which patient is doing without intervention at present, will CTM closely.   Discussed case with family and updated on results, who requests transfer to [facility] as daughter is a Marine scientist there.  Discussed case with [facility], accepted as a trauma transfer.  Neurological status remains stable, slightly groggy, but again awakens easily to voice, no acute changes in her mental status compared to arrival.   Final Clinical Impression(s) / ED Diagnosis  Final diagnoses:  Fall, initial encounter  Subdural hematoma ([place])  Intraparenchymal hemorrhage of brain [facility])       Note:  This document was prepared using Dragon voice recognition software and may include unintentional dictation errors.   Lilia Pro., MD 10/08/19 1629    Lilia Pro., MD 10/08/19 [phone]

## 2019-10-08 NOTE — ED Notes (Signed)
XRAY  POWERSHARE  WITH  [facility] 

## 2019-10-09 MED ORDER — ACETAMINOPHEN 325 MG PO TABS
650.00 | ORAL_TABLET | ORAL | Status: DC
Start: ? — End: 2019-10-09

## 2019-10-09 MED ORDER — ENALAPRIL MALEATE 10 MG PO TABS
5.00 | ORAL_TABLET | ORAL | Status: DC
Start: 2019-10-11 — End: 2019-10-09

## 2019-10-09 MED ORDER — INSULIN REGULAR HUMAN 100 UNIT/ML IJ SOLN
0.00 | INTRAMUSCULAR | Status: DC
Start: 2019-10-09 — End: 2019-10-09

## 2019-10-09 MED ORDER — METOPROLOL TARTRATE 25 MG PO TABS
12.50 | ORAL_TABLET | ORAL | Status: DC
Start: 2019-10-09 — End: 2019-10-09

## 2019-10-09 MED ORDER — DEXTROSE 50 % IV SOLN
12.50 | INTRAVENOUS | Status: DC
Start: ? — End: 2019-10-09

## 2019-10-09 MED ORDER — SODIUM CHLORIDE 0.9 % IV SOLN
50.00 | INTRAVENOUS | Status: DC
Start: ? — End: 2019-10-09

## 2019-10-10 MED ORDER — ENOXAPARIN SODIUM 40 MG/0.4ML ~~LOC~~ SOLN
40.00 | SUBCUTANEOUS | Status: DC
Start: 2019-10-11 — End: 2019-10-10

## 2019-10-11 DIAGNOSIS — S79922A Unspecified injury of left thigh, initial encounter: Secondary | ICD-10-CM | POA: Diagnosis not present

## 2019-10-11 DIAGNOSIS — I6202 Nontraumatic subacute subdural hemorrhage: Secondary | ICD-10-CM | POA: Diagnosis not present

## 2019-10-11 DIAGNOSIS — R791 Abnormal coagulation profile: Secondary | ICD-10-CM | POA: Diagnosis not present

## 2019-10-11 DIAGNOSIS — Z743 Need for continuous supervision: Secondary | ICD-10-CM | POA: Diagnosis not present

## 2019-10-11 DIAGNOSIS — R110 Nausea: Secondary | ICD-10-CM | POA: Diagnosis not present

## 2019-10-11 DIAGNOSIS — R001 Bradycardia, unspecified: Secondary | ICD-10-CM | POA: Diagnosis not present

## 2019-10-11 DIAGNOSIS — Z79899 Other long term (current) drug therapy: Secondary | ICD-10-CM | POA: Diagnosis not present

## 2019-10-11 DIAGNOSIS — S3993XA Unspecified injury of pelvis, initial encounter: Secondary | ICD-10-CM | POA: Diagnosis not present

## 2019-10-11 DIAGNOSIS — M549 Dorsalgia, unspecified: Secondary | ICD-10-CM | POA: Diagnosis not present

## 2019-10-11 DIAGNOSIS — S32019A Unspecified fracture of first lumbar vertebra, initial encounter for closed fracture: Secondary | ICD-10-CM | POA: Diagnosis not present

## 2019-10-11 DIAGNOSIS — I351 Nonrheumatic aortic (valve) insufficiency: Secondary | ICD-10-CM | POA: Diagnosis not present

## 2019-10-11 DIAGNOSIS — J449 Chronic obstructive pulmonary disease, unspecified: Secondary | ICD-10-CM | POA: Diagnosis not present

## 2019-10-11 DIAGNOSIS — S79921A Unspecified injury of right thigh, initial encounter: Secondary | ICD-10-CM | POA: Diagnosis not present

## 2019-10-11 DIAGNOSIS — M79651 Pain in right thigh: Secondary | ICD-10-CM | POA: Diagnosis not present

## 2019-10-11 DIAGNOSIS — M79652 Pain in left thigh: Secondary | ICD-10-CM | POA: Diagnosis not present

## 2019-10-11 DIAGNOSIS — M546 Pain in thoracic spine: Secondary | ICD-10-CM | POA: Diagnosis not present

## 2019-10-11 DIAGNOSIS — M79606 Pain in leg, unspecified: Secondary | ICD-10-CM | POA: Diagnosis not present

## 2019-10-11 DIAGNOSIS — M79662 Pain in left lower leg: Secondary | ICD-10-CM | POA: Diagnosis not present

## 2019-10-11 DIAGNOSIS — I4891 Unspecified atrial fibrillation: Secondary | ICD-10-CM | POA: Diagnosis not present

## 2019-10-11 DIAGNOSIS — R52 Pain, unspecified: Secondary | ICD-10-CM | POA: Diagnosis not present

## 2019-10-11 DIAGNOSIS — M79661 Pain in right lower leg: Secondary | ICD-10-CM | POA: Diagnosis not present

## 2019-10-11 DIAGNOSIS — I10 Essential (primary) hypertension: Secondary | ICD-10-CM | POA: Diagnosis not present

## 2019-10-12 DIAGNOSIS — M549 Dorsalgia, unspecified: Secondary | ICD-10-CM | POA: Diagnosis not present

## 2019-10-12 DIAGNOSIS — M79606 Pain in leg, unspecified: Secondary | ICD-10-CM | POA: Diagnosis not present

## 2019-10-12 DIAGNOSIS — I4891 Unspecified atrial fibrillation: Secondary | ICD-10-CM | POA: Diagnosis not present

## 2019-10-12 DIAGNOSIS — R110 Nausea: Secondary | ICD-10-CM | POA: Diagnosis not present

## 2019-10-17 DIAGNOSIS — I10 Essential (primary) hypertension: Secondary | ICD-10-CM | POA: Diagnosis not present

## 2019-10-17 DIAGNOSIS — I4891 Unspecified atrial fibrillation: Secondary | ICD-10-CM | POA: Diagnosis not present

## 2019-10-17 DIAGNOSIS — S065X9D Traumatic subdural hemorrhage with loss of consciousness of unspecified duration, subsequent encounter: Secondary | ICD-10-CM | POA: Diagnosis not present

## 2019-10-17 DIAGNOSIS — Z9181 History of falling: Secondary | ICD-10-CM | POA: Diagnosis not present

## 2019-10-17 DIAGNOSIS — I69820 Aphasia following other cerebrovascular disease: Secondary | ICD-10-CM | POA: Diagnosis not present

## 2019-10-21 ENCOUNTER — Encounter: Payer: Self-pay | Admitting: Family Medicine

## 2019-10-21 ENCOUNTER — Other Ambulatory Visit: Payer: Self-pay

## 2019-10-21 ENCOUNTER — Ambulatory Visit (INDEPENDENT_AMBULATORY_CARE_PROVIDER_SITE_OTHER): Payer: Medicare Other | Admitting: Family Medicine

## 2019-10-21 VITALS — BP 140/65 | HR 57 | Temp 96.9°F | Resp 16 | Wt 139.0 lb

## 2019-10-21 DIAGNOSIS — G44309 Post-traumatic headache, unspecified, not intractable: Secondary | ICD-10-CM | POA: Diagnosis not present

## 2019-10-21 DIAGNOSIS — I480 Paroxysmal atrial fibrillation: Secondary | ICD-10-CM

## 2019-10-21 DIAGNOSIS — S065XAA Traumatic subdural hemorrhage with loss of consciousness status unknown, initial encounter: Secondary | ICD-10-CM

## 2019-10-21 DIAGNOSIS — S065X9A Traumatic subdural hemorrhage with loss of consciousness of unspecified duration, initial encounter: Secondary | ICD-10-CM | POA: Diagnosis not present

## 2019-10-21 MED ORDER — TRAMADOL HCL 50 MG PO TABS: 50.0000 mg | ORAL_TABLET | Freq: Three times a day (TID) | ORAL | 0 refills | Status: AC | PRN

## 2019-10-21 NOTE — Patient Instructions (Signed)
Start Vitamin B2, CoQ10, and magnesium daily for headache prevention

## 2019-10-21 NOTE — Progress Notes (Signed)
Patient: Elaine [name] Female    DOB: 02/02/1941   79 y.o.   MRN: [id number] Visit Date: 10/21/2019  Today's Provider: Lavon Paganini, MD   Chief Complaint  Patient presents with  . Follow-up   Subjective:    I [place] S. Dimas, CMA, am acting as scribe for Lavon Paganini, MD.  HPI  Follow up Hospitalization  Patient was admitted to [facility] on 10/08/2019 and discharged on 10/10/2019. She was treated for fall, subdural hematoma with midline shift and intraparenchymal hemorrhage, a fib with RVR. Treatment for this included labs, scans. She reports excellent compliance with treatment. She reports this condition is Improved.   After her fall, when she was found unconscious, EMS was called and transported her to [facility] emergency department.  After CT showed left-sided acute on chronic subdural hematoma and left frontal IPH, she was transferred to [facility] only admitted to the neuro ICU for further neuro monitoring.  She was stable in the ICU and transferred out to the floor.  She developed A. fib with RVR during her stay and was evaluated by cardiology.  Her blood pressure was unable to sustain with beta-blocker, so she was discharged without any rate control.  Her aspirin was also discontinued due to recent fall and bleed.  Since discharge, she has had some sundowning and altered sleep schedule.  She is tired this morning.  She continues to have intermittent headaches.  She was told not to take NSAIDs.  She does not feel like Tylenol is very helpful. ------------------------------------------------------------------------------------    No Known Allergies   Current Outpatient Medications:  .  donepezil (ARICEPT) 5 MG tablet, Take 1 tablet (5 mg total) by mouth at bedtime., Disp: 30 tablet, Rfl: 3 .  enalapril (VASOTEC) 5 MG tablet, TAKE 1 TABLET BY MOUTH  EVERY DAY (Patient taking differently: Take 5 mg by mouth daily. ), Disp: 90 tablet, Rfl: 3 .  memantine (NAMENDA) 5 MG tablet,  Take 1 tablet (5 mg total) by mouth daily., Disp: 30 tablet, Rfl: 2 .  sertraline (ZOLOFT) 50 MG tablet, TAKE 1 TABLET BY MOUTH  DAILY (Patient taking differently: Take 50 mg by mouth daily. ), Disp: 90 tablet, Rfl: 3 .  aspirin EC 81 MG tablet, Take 81 mg by mouth daily., Disp: , Rfl:  .  Ibuprofen 200 MG CAPS, Take 1 capsule by mouth as needed. , Disp: , Rfl:   Review of Systems  Constitutional: Negative.   Cardiovascular: Negative.   Gastrointestinal: Negative for nausea and vomiting.  Neurological: Positive for headaches.    Social History   Tobacco Use  . Smoking status: Never Smoker  . Smokeless tobacco: Never Used  Substance Use Topics  . Alcohol use: Yes    Alcohol/week: 0.0 - 7.0 standard drinks    Comment: up to 1 drink daily      Objective:   BP 140/65 (BP Location: Right Arm, Patient Position: Sitting, Cuff Size: Normal)   Pulse (!) 57   Temp (!) 96.9 F (36.1 C) (Temporal)   Resp 16   Wt 139 lb (63 kg)   BMI 23.13 kg/m  Vitals:   10/21/19 0918  BP: 140/65  Pulse: (!) 57  Resp: 16  Temp: (!) 96.9 F (36.1 C)  TempSrc: Temporal  Weight: 139 lb (63 kg)  Body mass index is 23.13 kg/m.   Physical Exam Vitals reviewed.  Constitutional:      General: She is not in acute distress.    Appearance: Normal  appearance. She is not diaphoretic.     Comments: Alert, but sleepy  HENT:     Head: Normocephalic.  Eyes:     General: No scleral icterus.    Extraocular Movements: Extraocular movements intact.     Conjunctiva/sclera: Conjunctivae normal.     Pupils: Pupils are equal, round, and reactive to light.  Cardiovascular:     Rate and Rhythm: Normal rate and regular rhythm.     Heart sounds: Murmur present.  Pulmonary:     Effort: Pulmonary effort is normal. No respiratory distress.     Breath sounds: Normal breath sounds. No wheezing or rhonchi.  Abdominal:     General: There is no distension.     Palpations: Abdomen is soft.     Tenderness: There is  no abdominal tenderness.  Musculoskeletal:     Right lower leg: No edema.     Left lower leg: No edema.  Skin:    General: Skin is warm and dry.     Findings: No rash.  Neurological:     Mental Status: She is alert and oriented to person, place, and time. Mental status is at baseline.     Cranial Nerves: No cranial nerve deficit.     Sensory: No sensory deficit.     Motor: No weakness.     Coordination: Coordination normal.     Gait: Gait normal.  Psychiatric:        Mood and Affect: Mood normal.        Behavior: Behavior normal.     No results found for any visits on 10/21/19.     Assessment & Plan    1. 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 after fall and loss of consciousness on 10/08/2019 -She was observed by neurosurgery during her 2-day hospitalization and was stable -She remains neuro intact today -She does have some possible mild delirium in the setting of recent hospitalization and her known mild cognitive impairment at baseline -We discussed the importance of normalizing her schedule now that she is home and getting appropriate sleep -Physical therapy and Occupational Therapy home health have done their evaluations and will be starting this week -She has her neurosurgery follow-up scheduled, where she will have repeat head CT as well -We discussed monitoring her neuro status and if she has any acute changes, to go to the emergency room -Her daughter is a Marine scientist and is making sure that someone is with her at all times  2. Paroxysmal atrial fibrillation (HCC) -Chronic problem, with acute A. fib with RVR during her hospitalization that has now resolved -She is in normal sinus rhythm today -Avoid aspirin or anticoagulation given recent fall and subdural hematoma -Avoid beta-blocker given her baseline bradycardia -She has follow-up with cardiology upcoming  3. Post-concussion headache -Discussed is understandable for her  to have headaches, similar to what we experience with concussions after her head trauma -Avoid NSAIDs and aspirin given subdural hematoma -Can use Tylenol as needed -Tramadol as needed for severe headaches -If she develops any neuro deficits, she will need to be seen promptly in the emergency department -Trial of vitamin B2, magnesium, co-Q10 for headache prevention    Meds ordered this encounter  Medications  . traMADol (ULTRAM) 50 MG tablet    Sig: Take 1 tablet (50 mg total) by mouth every 8 (eight) hours as needed for up to 5 days.    Dispense:  15 tablet    Refill:  0  Return if symptoms worsen or fail to improve.   The entirety of the information documented in the History of Present Illness, Review of Systems and Physical Exam were personally obtained by me. Portions of this information were initially documented by Lynford Humphrey, CMA and reviewed by me for thoroughness and accuracy.    Ranette Luckadoo, Dionne Bucy, MD MPH [facility]

## 2019-10-22 DIAGNOSIS — S065X9D Traumatic subdural hemorrhage with loss of consciousness of unspecified duration, subsequent encounter: Secondary | ICD-10-CM | POA: Diagnosis not present

## 2019-10-22 DIAGNOSIS — I4891 Unspecified atrial fibrillation: Secondary | ICD-10-CM | POA: Diagnosis not present

## 2019-10-22 DIAGNOSIS — Z9181 History of falling: Secondary | ICD-10-CM | POA: Diagnosis not present

## 2019-10-22 DIAGNOSIS — I10 Essential (primary) hypertension: Secondary | ICD-10-CM | POA: Diagnosis not present

## 2019-10-22 DIAGNOSIS — I69820 Aphasia following other cerebrovascular disease: Secondary | ICD-10-CM | POA: Diagnosis not present

## 2019-10-25 DIAGNOSIS — I4891 Unspecified atrial fibrillation: Secondary | ICD-10-CM | POA: Diagnosis not present

## 2019-10-25 DIAGNOSIS — Z9181 History of falling: Secondary | ICD-10-CM | POA: Diagnosis not present

## 2019-10-25 DIAGNOSIS — S065X9D Traumatic subdural hemorrhage with loss of consciousness of unspecified duration, subsequent encounter: Secondary | ICD-10-CM | POA: Diagnosis not present

## 2019-10-25 DIAGNOSIS — I69820 Aphasia following other cerebrovascular disease: Secondary | ICD-10-CM | POA: Diagnosis not present

## 2019-10-25 DIAGNOSIS — I10 Essential (primary) hypertension: Secondary | ICD-10-CM | POA: Diagnosis not present

## 2019-10-30 DIAGNOSIS — I4891 Unspecified atrial fibrillation: Secondary | ICD-10-CM | POA: Diagnosis not present

## 2019-10-30 DIAGNOSIS — S065X9D Traumatic subdural hemorrhage with loss of consciousness of unspecified duration, subsequent encounter: Secondary | ICD-10-CM | POA: Diagnosis not present

## 2019-10-30 DIAGNOSIS — Z9181 History of falling: Secondary | ICD-10-CM | POA: Diagnosis not present

## 2019-10-30 DIAGNOSIS — I10 Essential (primary) hypertension: Secondary | ICD-10-CM | POA: Diagnosis not present

## 2019-10-30 DIAGNOSIS — I69820 Aphasia following other cerebrovascular disease: Secondary | ICD-10-CM | POA: Diagnosis not present

## 2019-11-01 DIAGNOSIS — I10 Essential (primary) hypertension: Secondary | ICD-10-CM | POA: Diagnosis not present

## 2019-11-01 DIAGNOSIS — I4891 Unspecified atrial fibrillation: Secondary | ICD-10-CM | POA: Diagnosis not present

## 2019-11-01 DIAGNOSIS — Z9181 History of falling: Secondary | ICD-10-CM | POA: Diagnosis not present

## 2019-11-01 DIAGNOSIS — I69820 Aphasia following other cerebrovascular disease: Secondary | ICD-10-CM | POA: Diagnosis not present

## 2019-11-01 DIAGNOSIS — S065X9D Traumatic subdural hemorrhage with loss of consciousness of unspecified duration, subsequent encounter: Secondary | ICD-10-CM | POA: Diagnosis not present

## 2019-11-05 DIAGNOSIS — I10 Essential (primary) hypertension: Secondary | ICD-10-CM | POA: Diagnosis not present

## 2019-11-05 DIAGNOSIS — Z8249 Family history of ischemic heart disease and other diseases of the circulatory system: Secondary | ICD-10-CM | POA: Diagnosis not present

## 2019-11-05 DIAGNOSIS — S065X9D Traumatic subdural hemorrhage with loss of consciousness of unspecified duration, subsequent encounter: Secondary | ICD-10-CM | POA: Diagnosis not present

## 2019-11-05 DIAGNOSIS — S02119A Unspecified fracture of occiput, initial encounter for closed fracture: Secondary | ICD-10-CM | POA: Diagnosis not present

## 2019-11-05 DIAGNOSIS — G936 Cerebral edema: Secondary | ICD-10-CM | POA: Diagnosis not present

## 2019-11-05 DIAGNOSIS — S065X9A Traumatic subdural hemorrhage with loss of consciousness of unspecified duration, initial encounter: Secondary | ICD-10-CM | POA: Diagnosis not present

## 2019-11-05 DIAGNOSIS — G935 Compression of brain: Secondary | ICD-10-CM | POA: Diagnosis not present

## 2019-11-05 DIAGNOSIS — I4891 Unspecified atrial fibrillation: Secondary | ICD-10-CM | POA: Diagnosis not present

## 2019-11-08 DIAGNOSIS — I4891 Unspecified atrial fibrillation: Secondary | ICD-10-CM | POA: Diagnosis not present

## 2019-11-08 DIAGNOSIS — I69820 Aphasia following other cerebrovascular disease: Secondary | ICD-10-CM | POA: Diagnosis not present

## 2019-11-08 DIAGNOSIS — S065X9D Traumatic subdural hemorrhage with loss of consciousness of unspecified duration, subsequent encounter: Secondary | ICD-10-CM | POA: Diagnosis not present

## 2019-11-08 DIAGNOSIS — Z9181 History of falling: Secondary | ICD-10-CM | POA: Diagnosis not present

## 2019-11-08 DIAGNOSIS — I10 Essential (primary) hypertension: Secondary | ICD-10-CM | POA: Diagnosis not present

## 2019-11-12 DIAGNOSIS — I69820 Aphasia following other cerebrovascular disease: Secondary | ICD-10-CM | POA: Diagnosis not present

## 2019-11-12 DIAGNOSIS — S065X9D Traumatic subdural hemorrhage with loss of consciousness of unspecified duration, subsequent encounter: Secondary | ICD-10-CM | POA: Diagnosis not present

## 2019-11-12 DIAGNOSIS — I4891 Unspecified atrial fibrillation: Secondary | ICD-10-CM | POA: Diagnosis not present

## 2019-11-12 DIAGNOSIS — Z9181 History of falling: Secondary | ICD-10-CM | POA: Diagnosis not present

## 2019-11-12 DIAGNOSIS — I10 Essential (primary) hypertension: Secondary | ICD-10-CM | POA: Diagnosis not present

## 2019-11-13 DIAGNOSIS — I69820 Aphasia following other cerebrovascular disease: Secondary | ICD-10-CM | POA: Diagnosis not present

## 2019-11-13 DIAGNOSIS — I4891 Unspecified atrial fibrillation: Secondary | ICD-10-CM | POA: Diagnosis not present

## 2019-11-13 DIAGNOSIS — I10 Essential (primary) hypertension: Secondary | ICD-10-CM | POA: Diagnosis not present

## 2019-11-13 DIAGNOSIS — Z9181 History of falling: Secondary | ICD-10-CM | POA: Diagnosis not present

## 2019-11-13 DIAGNOSIS — S065X9D Traumatic subdural hemorrhage with loss of consciousness of unspecified duration, subsequent encounter: Secondary | ICD-10-CM | POA: Diagnosis not present

## 2019-11-14 DIAGNOSIS — Z9181 History of falling: Secondary | ICD-10-CM | POA: Diagnosis not present

## 2019-11-14 DIAGNOSIS — I10 Essential (primary) hypertension: Secondary | ICD-10-CM | POA: Diagnosis not present

## 2019-11-14 DIAGNOSIS — S065X9D Traumatic subdural hemorrhage with loss of consciousness of unspecified duration, subsequent encounter: Secondary | ICD-10-CM | POA: Diagnosis not present

## 2019-11-14 DIAGNOSIS — I4891 Unspecified atrial fibrillation: Secondary | ICD-10-CM | POA: Diagnosis not present

## 2019-11-14 DIAGNOSIS — I69820 Aphasia following other cerebrovascular disease: Secondary | ICD-10-CM | POA: Diagnosis not present

## 2019-11-15 ENCOUNTER — Telehealth: Payer: Self-pay

## 2019-11-15 NOTE — Telephone Encounter (Signed)
Copied from [place]anca [phone]. Topic: General - Other >> Nov 15, 2019  8:46 AM Sheran Luz wrote: Patient's daughter calling to inquire about FMLA forms that were sent to office via fax x2 weeks ago. She states she has not heard anything back regarding this paperwork. She states she previously spoke with someone in office about forms.

## 2019-11-18 DIAGNOSIS — I69820 Aphasia following other cerebrovascular disease: Secondary | ICD-10-CM | POA: Diagnosis not present

## 2019-11-18 DIAGNOSIS — I10 Essential (primary) hypertension: Secondary | ICD-10-CM | POA: Diagnosis not present

## 2019-11-18 DIAGNOSIS — I4891 Unspecified atrial fibrillation: Secondary | ICD-10-CM | POA: Diagnosis not present

## 2019-11-18 DIAGNOSIS — Z9181 History of falling: Secondary | ICD-10-CM | POA: Diagnosis not present

## 2019-11-18 DIAGNOSIS — S065X9D Traumatic subdural hemorrhage with loss of consciousness of unspecified duration, subsequent encounter: Secondary | ICD-10-CM | POA: Diagnosis not present

## 2019-11-18 NOTE — Telephone Encounter (Signed)
I think I remember completing them.  Maybe gave them to [facility].  I think she may have been working with [place] that day. Can re-do them if needed if they did not receive them

## 2019-11-18 NOTE — Telephone Encounter (Signed)
Pts daughter calling in regarding fmla paperwork. She states that she still has not heard back regarding this paperwork. Pts daughter is beginning to become frustrated. Please advise.

## 2019-11-18 NOTE — Telephone Encounter (Signed)
Have you seen her FMLA forms?

## 2019-11-19 NOTE — Telephone Encounter (Signed)
Mrs. Elaine [name] advised to please have FMLA paperwork faxed to [phone].

## 2019-11-20 DIAGNOSIS — S065X9D Traumatic subdural hemorrhage with loss of consciousness of unspecified duration, subsequent encounter: Secondary | ICD-10-CM | POA: Diagnosis not present

## 2019-11-20 DIAGNOSIS — I10 Essential (primary) hypertension: Secondary | ICD-10-CM | POA: Diagnosis not present

## 2019-11-20 DIAGNOSIS — I69820 Aphasia following other cerebrovascular disease: Secondary | ICD-10-CM | POA: Diagnosis not present

## 2019-11-20 DIAGNOSIS — Z9181 History of falling: Secondary | ICD-10-CM | POA: Diagnosis not present

## 2019-11-20 DIAGNOSIS — I4891 Unspecified atrial fibrillation: Secondary | ICD-10-CM | POA: Diagnosis not present

## 2019-11-22 DIAGNOSIS — I4891 Unspecified atrial fibrillation: Secondary | ICD-10-CM | POA: Diagnosis not present

## 2019-11-22 DIAGNOSIS — I351 Nonrheumatic aortic (valve) insufficiency: Secondary | ICD-10-CM | POA: Diagnosis not present

## 2019-11-22 DIAGNOSIS — I10 Essential (primary) hypertension: Secondary | ICD-10-CM | POA: Diagnosis not present

## 2019-11-22 DIAGNOSIS — R42 Dizziness and giddiness: Secondary | ICD-10-CM | POA: Diagnosis not present

## 2019-11-22 DIAGNOSIS — R001 Bradycardia, unspecified: Secondary | ICD-10-CM | POA: Diagnosis not present

## 2019-11-27 DIAGNOSIS — I10 Essential (primary) hypertension: Secondary | ICD-10-CM | POA: Diagnosis not present

## 2019-11-27 DIAGNOSIS — I4891 Unspecified atrial fibrillation: Secondary | ICD-10-CM | POA: Diagnosis not present

## 2019-11-27 DIAGNOSIS — S065X9D Traumatic subdural hemorrhage with loss of consciousness of unspecified duration, subsequent encounter: Secondary | ICD-10-CM | POA: Diagnosis not present

## 2019-11-27 DIAGNOSIS — I69820 Aphasia following other cerebrovascular disease: Secondary | ICD-10-CM | POA: Diagnosis not present

## 2019-11-27 DIAGNOSIS — Z9181 History of falling: Secondary | ICD-10-CM | POA: Diagnosis not present

## 2019-11-29 DIAGNOSIS — I4891 Unspecified atrial fibrillation: Secondary | ICD-10-CM | POA: Diagnosis not present

## 2019-11-29 DIAGNOSIS — S065X9D Traumatic subdural hemorrhage with loss of consciousness of unspecified duration, subsequent encounter: Secondary | ICD-10-CM | POA: Diagnosis not present

## 2019-11-29 DIAGNOSIS — Z9181 History of falling: Secondary | ICD-10-CM | POA: Diagnosis not present

## 2019-11-29 DIAGNOSIS — I69820 Aphasia following other cerebrovascular disease: Secondary | ICD-10-CM | POA: Diagnosis not present

## 2019-11-29 DIAGNOSIS — I10 Essential (primary) hypertension: Secondary | ICD-10-CM | POA: Diagnosis not present

## 2019-12-04 DIAGNOSIS — S065X9D Traumatic subdural hemorrhage with loss of consciousness of unspecified duration, subsequent encounter: Secondary | ICD-10-CM | POA: Diagnosis not present

## 2019-12-04 DIAGNOSIS — I69820 Aphasia following other cerebrovascular disease: Secondary | ICD-10-CM | POA: Diagnosis not present

## 2019-12-04 DIAGNOSIS — I10 Essential (primary) hypertension: Secondary | ICD-10-CM | POA: Diagnosis not present

## 2019-12-04 DIAGNOSIS — I4891 Unspecified atrial fibrillation: Secondary | ICD-10-CM | POA: Diagnosis not present

## 2019-12-04 DIAGNOSIS — Z9181 History of falling: Secondary | ICD-10-CM | POA: Diagnosis not present

## 2019-12-05 ENCOUNTER — Other Ambulatory Visit: Payer: Self-pay | Admitting: Family Medicine

## 2019-12-12 DIAGNOSIS — S065X9D Traumatic subdural hemorrhage with loss of consciousness of unspecified duration, subsequent encounter: Secondary | ICD-10-CM | POA: Diagnosis not present

## 2019-12-12 DIAGNOSIS — I4891 Unspecified atrial fibrillation: Secondary | ICD-10-CM | POA: Diagnosis not present

## 2019-12-12 DIAGNOSIS — I69820 Aphasia following other cerebrovascular disease: Secondary | ICD-10-CM | POA: Diagnosis not present

## 2019-12-12 DIAGNOSIS — I10 Essential (primary) hypertension: Secondary | ICD-10-CM | POA: Diagnosis not present

## 2019-12-12 DIAGNOSIS — Z9181 History of falling: Secondary | ICD-10-CM | POA: Diagnosis not present

## 2019-12-19 ENCOUNTER — Telehealth: Payer: Self-pay

## 2019-12-19 NOTE — Telephone Encounter (Signed)
Copied from Amelia [phone]. Topic: General - Other >> Dec 19, 2019 12:11 PM Oneta Rack wrote: Osvaldo Human name: Santiago Glad  Relation to pt: Speech Therapist from [facility]   Call back number: [phone]    Reason for call:  Patient missed home visit today due to patient being out town

## 2019-12-31 DIAGNOSIS — S065X0A Traumatic subdural hemorrhage without loss of consciousness, initial encounter: Secondary | ICD-10-CM | POA: Diagnosis not present

## 2019-12-31 DIAGNOSIS — S065X9A Traumatic subdural hemorrhage with loss of consciousness of unspecified duration, initial encounter: Secondary | ICD-10-CM | POA: Diagnosis not present

## 2019-12-31 DIAGNOSIS — I10 Essential (primary) hypertension: Secondary | ICD-10-CM | POA: Diagnosis not present

## 2019-12-31 DIAGNOSIS — I351 Nonrheumatic aortic (valve) insufficiency: Secondary | ICD-10-CM | POA: Diagnosis not present

## 2019-12-31 DIAGNOSIS — S062X9A Diffuse traumatic brain injury with loss of consciousness of unspecified duration, initial encounter: Secondary | ICD-10-CM | POA: Diagnosis not present

## 2019-12-31 DIAGNOSIS — G9389 Other specified disorders of brain: Secondary | ICD-10-CM | POA: Diagnosis not present

## 2019-12-31 DIAGNOSIS — S02119A Unspecified fracture of occiput, initial encounter for closed fracture: Secondary | ICD-10-CM | POA: Diagnosis not present

## 2019-12-31 DIAGNOSIS — I4891 Unspecified atrial fibrillation: Secondary | ICD-10-CM | POA: Diagnosis not present

## 2020-01-29 ENCOUNTER — Telehealth: Payer: Self-pay | Admitting: Family Medicine

## 2020-01-29 NOTE — Telephone Encounter (Signed)
Is this something you are willing to do without seeing her?

## 2020-01-29 NOTE — Telephone Encounter (Signed)
Pt called in to ask if provider to request a refill for 10 MG of donepezil (ARICEPT) instead of  5 MG tablet.     Pharmacy :  [id number] - [place], [place] Phone:  [phone]  Fax:  [phone]

## 2020-01-30 MED ORDER — DONEPEZIL HCL 10 MG PO TABS: 10.0000 mg | ORAL_TABLET | Freq: Every day | ORAL | 1 refills | Status: DC

## 2020-01-30 NOTE — Telephone Encounter (Signed)
Patient did not tolerate the 10 mg of Aricept due to diarrhea.  We have discussed possibility of increasing Namenda, which we can discuss at her upcoming visit.

## 2020-01-30 NOTE — Telephone Encounter (Signed)
Patient reports that she has been taking Aricept 10mg  daily and is tolerating well. Patient requesting to refill Aricept 10mg  daily.

## 2020-01-30 NOTE — Telephone Encounter (Signed)
Ok to refill 

## 2020-02-06 ENCOUNTER — Observation Stay
Admission: EM | Admit: 2020-02-06 | Discharge: 2020-02-07 | Disposition: A | Discharge status: Home / Self Care | Payer: Medicare Other | Attending: Internal Medicine | Admitting: Internal Medicine

## 2020-02-06 ENCOUNTER — Encounter: Payer: Self-pay | Admitting: Emergency Medicine

## 2020-02-06 ENCOUNTER — Other Ambulatory Visit: Payer: Self-pay

## 2020-02-06 ENCOUNTER — Emergency Department: Payer: Medicare Other

## 2020-02-06 DIAGNOSIS — R55 Syncope and collapse: Principal | ICD-10-CM | POA: Diagnosis present

## 2020-02-06 DIAGNOSIS — I10 Essential (primary) hypertension: Secondary | ICD-10-CM | POA: Insufficient documentation

## 2020-02-06 DIAGNOSIS — F0280 Dementia in other diseases classified elsewhere without behavioral disturbance: Secondary | ICD-10-CM | POA: Insufficient documentation

## 2020-02-06 DIAGNOSIS — S065XAA Traumatic subdural hemorrhage with loss of consciousness status unknown, initial encounter: Secondary | ICD-10-CM

## 2020-02-06 DIAGNOSIS — R519 Headache, unspecified: Secondary | ICD-10-CM | POA: Diagnosis not present

## 2020-02-06 DIAGNOSIS — Z79899 Other long term (current) drug therapy: Secondary | ICD-10-CM | POA: Insufficient documentation

## 2020-02-06 DIAGNOSIS — Z7982 Long term (current) use of aspirin: Secondary | ICD-10-CM | POA: Diagnosis not present

## 2020-02-06 DIAGNOSIS — F329 Major depressive disorder, single episode, unspecified: Secondary | ICD-10-CM | POA: Insufficient documentation

## 2020-02-06 DIAGNOSIS — Z20822 Contact with and (suspected) exposure to covid-19: Secondary | ICD-10-CM | POA: Diagnosis not present

## 2020-02-06 DIAGNOSIS — G309 Alzheimer's disease, unspecified: Secondary | ICD-10-CM | POA: Diagnosis not present

## 2020-02-06 DIAGNOSIS — X58XXXA Exposure to other specified factors, initial encounter: Secondary | ICD-10-CM | POA: Diagnosis not present

## 2020-02-06 DIAGNOSIS — S065X0A Traumatic subdural hemorrhage without loss of consciousness, initial encounter: Secondary | ICD-10-CM | POA: Diagnosis not present

## 2020-02-06 DIAGNOSIS — S065X9A Traumatic subdural hemorrhage with loss of consciousness of unspecified duration, initial encounter: Secondary | ICD-10-CM | POA: Insufficient documentation

## 2020-02-06 DIAGNOSIS — Z03818 Encounter for observation for suspected exposure to other biological agents ruled out: Secondary | ICD-10-CM | POA: Diagnosis not present

## 2020-02-06 DIAGNOSIS — R42 Dizziness and giddiness: Secondary | ICD-10-CM | POA: Insufficient documentation

## 2020-02-06 HISTORY — DX: Essential (primary) hypertension: I10

## 2020-02-06 LAB — URINALYSIS, COMPLETE (UACMP) WITH MICROSCOPIC
Bacteria, UA: NONE SEEN
Bilirubin Urine: NEGATIVE
Glucose, UA: NEGATIVE mg/dL
Ketones, ur: NEGATIVE mg/dL
Leukocytes,Ua: NEGATIVE
Nitrite: NEGATIVE
Protein, ur: NEGATIVE mg/dL
Specific Gravity, Urine: 1.02 (ref 1.005–1.030)
pH: 5 (ref 5.0–8.0)

## 2020-02-06 LAB — CBC
HCT: 38.1 % (ref 36.0–46.0)
Hemoglobin: 12.9 g/dL (ref 12.0–15.0)
MCH: 31 pg (ref 26.0–34.0)
MCHC: 33.9 g/dL (ref 30.0–36.0)
MCV: 91.6 fL (ref 80.0–100.0)
Platelets: 175 10*3/uL (ref 150–400)
RBC: 4.16 MIL/uL (ref 3.87–5.11)
RDW: 12.1 % (ref 11.5–15.5)
WBC: 5.8 10*3/uL (ref 4.0–10.5)
nRBC: 0 % (ref 0.0–0.2)

## 2020-02-06 LAB — BASIC METABOLIC PANEL
Anion gap: 9 (ref 5–15)
BUN: 21 mg/dL (ref 8–23)
CO2: 23 mmol/L (ref 22–32)
Calcium: 8.9 mg/dL (ref 8.9–10.3)
Chloride: 107 mmol/L (ref 98–111)
Creatinine, Ser: 0.66 mg/dL (ref 0.44–1.00)
GFR calc Af Amer: >60 mL/min (ref >60)
GFR calc non Af Amer: >60 mL/min (ref >60)
Glucose, Bld: 88 mg/dL (ref 70–99)
Potassium: 3.7 mmol/L (ref 3.5–5.1)
Sodium: 139 mmol/L (ref 135–145)

## 2020-02-06 LAB — GLUCOSE, CAPILLARY: Glucose-Capillary: 76 mg/dL (ref 70–99)

## 2020-02-06 MED ORDER — SODIUM CHLORIDE 0.9% FLUSH: 3.0000 mL | Freq: Once | INTRAVENOUS | Status: DC

## 2020-02-06 MED ORDER — LEVETIRACETAM IN NACL 1000 MG/100ML IV SOLN
1000.0000 mg | Freq: Once | INTRAVENOUS | Status: AC
  Administered 2020-02-06: 1000 mg via INTRAVENOUS
  Filled 2020-02-06: qty 100

## 2020-02-06 NOTE — ED Provider Notes (Signed)
[facility] Emergency Department Provider Note  ____________________________________________   First MD Initiated Contact with Patient 02/06/20 2144     (approximate)  I have reviewed the triage vital signs and the nursing notes.   HISTORY  Chief Complaint Near Syncope    HPI Elaine [name] is a 79 y.o. female with Alzheimer's, pression, hypertension comes in for syncope.  Patient comes in from home reports that she passed out in the car prior to arrival patient does report a mild headache.  To note patient was seen and evaluated back in February with a large acute subdural hematoma.  Patient had to be transferred to tertiary hospital.  Patient went to [facility].  Last CT scan was 6 to 8 weeks ago was stable.  According to partner who is at bedside she was in the car when she leaned to the side and was unresponsive for about 15 to 20 seconds.  He noted that the left arm seemed a little contractured and there might have been a little bit of shaking with it.  Denies urinating on herself.  She does not remember this event.  Nothing made it better, nothing made it worse.  Not on any seizure medicine.  Feels at her baseline self now and partner agrees that she is acting her normal self.          Past Medical History:  Diagnosis Date  . Alzheimer disease ([place])   . Depression   . Hypertension     Patient Active Problem List   Diagnosis Date Noted  . Mild cognitive impairment with memory loss 07/10/2019  . Hypercholesteremia 01/22/2016  . Insomnia 09/21/2015  . 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 Arthritis, degenerative 05/28/2015  . 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 Restless leg 05/28/2015  . Rotator cuff syndrome 07/03/2013  . 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 Acid reflux 06/08/2007  . Hypertension 07/26/2005    Past Surgical History:  Procedure Laterality  Date  . ANKLE SURGERY    . AUGMENTATION MAMMAPLASTY Bilateral 2006  . BREAST BIOPSY Right 1984   EXCISIONAL - NEG  . BREAST ENHANCEMENT SURGERY Bilateral   . HEMORRHOID SURGERY  2011   Hemorrhoidectomy  . TONSILLECTOMY AND ADENOIDECTOMY    . TUBAL LIGATION      Prior to Admission medications   Medication Sig Start Date End Date Taking? Authorizing Provider  aspirin EC 81 MG tablet Take 81 mg by mouth daily.    [provider]  donepezil (ARICEPT) 10 MG tablet Take 1 tablet (10 mg total) by mouth at bedtime. 01/30/20   Bacigalupo, Dionne Bucy, MD  enalapril (VASOTEC) 5 MG tablet TAKE 1 TABLET BY MOUTH  EVERY DAY Patient taking differently: Take 5 mg by mouth daily.  07/01/19   Virginia Crews, MD  Ibuprofen 200 MG CAPS Take 1 capsule by mouth as needed.     [provider]  memantine (NAMENDA) 5 MG tablet TAKE 1 TABLET BY MOUTH EVERY DAY 12/05/19   Bacigalupo, Dionne Bucy, MD  sertraline (ZOLOFT) 50 MG tablet TAKE 1 TABLET BY MOUTH  DAILY Patient taking differently: Take 50 mg by mouth daily.  07/01/19   Virginia Crews, MD    Allergies Patient has no known allergies.  Family History  Problem Relation Age of Onset  . Heart attack Father   . Hypertension Sister        pulmonary HTN  . Breast  cancer Maternal Aunt     Social History Social History   Tobacco Use  . Smoking status: Never Smoker  . Smokeless tobacco: Never Used  Substance Use Topics  . Alcohol use: Yes    Alcohol/week: 0.0 - 7.0 standard drinks    Comment: up to 1 drink daily  . Drug use: No      Review of Systems Constitutional: No fever/chills, LOC Eyes: No visual changes. ENT: No sore throat. Cardiovascular: Denies chest pain. Respiratory: Denies shortness of breath. Gastrointestinal: No abdominal pain.  No nausea, no vomiting.  No diarrhea.  No constipation. Genitourinary: Negative for dysuria. Musculoskeletal: Negative for back pain. Skin: Negative for rash. Neurological:  Negative for headaches, focal weakness or numbness.,  Shaking of her left hand All other ROS negative ____________________________________________   PHYSICAL EXAM:  VITAL SIGNS: ED Triage Vitals  Enc Vitals Group     BP 02/06/20 1918 (!) 175/62     Pulse Rate 02/06/20 1918 (!) 52     Resp 02/06/20 1918 16     Temp 02/06/20 1918 99.1 F (37.3 C)     Temp Source 02/06/20 1918 Oral     SpO2 02/06/20 1918 98 %     Weight 02/06/20 1920 140 lb (63.5 kg)     Height 02/06/20 1920 5\' 3"  (1.6 m)     Head Circumference --      Peak Flow --      Pain Score 02/06/20 1919 0     Pain Loc --      Pain Edu? --      Excl. in [place]? --     Constitutional: Alert and oriented. Well appearing and in no acute distress. Eyes: Conjunctivae are normal. EOMI. Head: Atraumatic. Nose: No congestion/rhinnorhea. Mouth/Throat: Mucous membranes are moist.   Neck: No stridor. Trachea Midline. FROM Cardiovascular: Normal rate, regular rhythm. Grossly normal heart sounds.  Good peripheral circulation. Respiratory: Normal respiratory effort.  No retractions. Lungs CTAB. Gastrointestinal: Soft and nontender. No distention. No abdominal bruits.  Musculoskeletal: No lower extremity tenderness nor edema.  No joint effusions. Neurologic:  Normal speech and language.  Equal strength in arms and legs.  Cranial nerves are intact. Skin:  Skin is warm, dry and intact. No rash noted. Psychiatric: Mood and affect are normal. Speech and behavior are normal. GU: Deferred   ____________________________________________   LABS (all labs ordered are listed, but only abnormal results are displayed)  Labs Reviewed  URINALYSIS, COMPLETE (UACMP) WITH MICROSCOPIC - Abnormal; Notable for the following components:      Result Value   Color, Urine YELLOW (*)    APPearance HAZY (*)    Hgb urine dipstick SMALL (*)    All other components within normal limits  SARS CORONAVIRUS 2 BY RT PCR (HOSPITAL ORDER, [facility])  BASIC METABOLIC PANEL  CBC  GLUCOSE, CAPILLARY  CBG MONITORING, ED   ____________________________________________   ED ECG REPORT I, Vanessa Beckley, the attending physician, personally viewed and interpreted this ECG.  Sinus bradycardia rate of 52, no ST ovation, no T wave inversions, normal intervals ____________________________________________  RADIOLOGY   Official radiology report(s): CT Head Wo Contrast  Result Date: 02/06/2020 CLINICAL DATA:  Recurrent syncope, mild headache EXAM: CT HEAD WITHOUT CONTRAST TECHNIQUE: Contiguous axial images were obtained from the base of the skull through the vertex without intravenous contrast. COMPARISON:  CT 10/08/2019 FINDINGS: Brain: There is a 5.7 cm subdural collection extending across the left frontal convexity with  some mixed attenuation material seen more inferiorly within the collection as it extends across the temporal lobe and inferior parietal. While much of this could reflect residual from a subdural hematoma seen on comparison, the more hyperdense material could reflect some more acute to subacute hemorrhagic products. Only mild effacement of the adjacent sulci without significant midline shift.No other convincing sites of hemorrhage or mass effect. Encephalomalacia of the left frontal lobe reflecting expected progression of the hemorrhagic contusive change seen on comparison CT. 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 No new sites concerning for acute CT evident infarct. Findings on a background of chronic microvascular change with patchy periventricular and deep white matter hypoattenuation. Symmetric prominence of the ventricles, cisterns and sulci compatible with parenchymal volume loss. Vascular: Atherosclerotic calcification of the carotid siphons. No hyperdense vessel. Skull: No calvarial fracture or suspicious osseous lesion. No scalp swelling or hematoma. Sinuses/Orbits: Paranasal sinuses are  predominantly clear. Trace dependent mastoid effusions bilaterally. Middle ear cavities are clear. Included orbital structures are unremarkable. Other: Bilateral TMJ arthrosis. IMPRESSION: 1. Subdural collection extending across the left frontal convexity up to 7 mm in maximal thickness. Mixed high attenuation material seen more inferiorly within the collection could reflect some more acute to subacute hemorrhagic products admixed with more chronic subdural fluid. Only mild effacement of the adjacent sulci without significant midline shift. 2. Encephalomalacia of the left frontal lobe reflecting expected progression of the hemorrhagic contusive change seen on comparison CT. 3. 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 4. Background of chronic microvascular angiopathy and parenchymal volume loss. 5. Trace dependent mastoid effusions bilaterally. Critical Value/emergent results were called by telephone at the time of interpretation on 02/06/2020 at 9:11 pm to provider [facility] , who verbally acknowledged these results. Electronically Signed   By: Lovena Le M.D.   On: 02/06/2020 21:11    ____________________________________________   PROCEDURES  Procedure(s) performed (including Critical Care):  Procedures   ____________________________________________   INITIAL IMPRESSION / ASSESSMENT AND PLAN / ED COURSE  Elaine [name] was evaluated in Emergency Department on 02/06/2020 for the symptoms described in the history of present illness. She was evaluated in the context of the global COVID-19 pandemic, which necessitated consideration that the patient might be at risk for infection with the SARS-CoV-2 virus that causes COVID-19. Institutional protocols and algorithms that pertain to the evaluation of patients at risk for COVID-19 are in a state of rapid change based on information released by regulatory bodies including the CDC and federal and state organizations. These  policies and algorithms were followed during the patient's care in the ED.    Patient is a 79 year old who comes in with syncopal versus seizure episode.  Will get CT head given prior subdural to make sure evolution although she did not hit her head.  Will get labs to evaluate Electra abnormalities, AKI, anemia and EKG to evaluate for arrhythmia.  Labs are reassuring.  EKG without arrhythmia.  CT head concerning for possibly new acute bleed on the chronic bleed.  D/w Dr. Izora Ribas.  This is unlikely to require surgical intervention.  He does not feel it looks very different than prior.  Recommended repeat CT head in 6 hours, giving Keppra 1 g and if concern for seizure potentially could send patient home on some seizure medicine if we think that it is related to seizures.  Discussed with hospitalist for admission for syncopal versus possible seizure work-up.  Will wait for the CT head  to come back and if stable patient will be admitted to the hospitalist.  Patient handed off to oncoming team pending these results.  If the CT scan is evolving they would need to rediscuss with Dr. Izora Ribas        ____________________________________________   FINAL CLINICAL IMPRESSION(S) / ED DIAGNOSES   Final diagnoses:  Syncope, unspecified syncope type  Subdural hematoma ([place])      MEDICATIONS GIVEN DURING THIS VISIT:  Medications  sodium chloride flush (NS) 0.9 % injection 3 mL (3 mLs Intravenous Not Given 02/06/20 2207)  levETIRAcetam (KEPPRA) IVPB 1000 mg/100 mL premix (1,000 mg Intravenous New Bag/Given 02/06/20 2327)     ED Discharge Orders    None       Note:  This document was prepared using Dragon voice recognition software and may include unintentional dictation errors.   Vanessa Camano, MD 02/06/20 [phone]

## 2020-02-06 NOTE — ED Notes (Signed)
Pt presents to ED following a near syncope episode while riding in car w/ significant other this afternoon. Pt's significant other stated pt began leaning over to the side and was not responding to him w/ he tried to communicate w/ her. Sig. Other stated episode lasted 15-20 seconds. Pt  Denies LOC and recalls episode but states she couldn't respond at the time. Pt alert in bed at this time.

## 2020-02-06 NOTE — ED Triage Notes (Signed)
Pt presents to ER from home reports she passed out in the car, prior to arrival. Pt reports she has not been feeling well all day, reports mild headache. Pt talks in complete sentences no distress noted

## 2020-02-06 NOTE — ED Provider Notes (Signed)
-----------------------------------------   11:15 PM on 02/06/2020 -----------------------------------------  Assuming care from Dr. Jari Pigg.  In short, Elaine [name] is a 79 y.o. female with a chief complaint of acute vs subacute vs chronic subdural and possible syncope vs seizure.  Refer to the original H&P for additional details.  The current plan of care is to follow up on CT head scheduled for about 3:15am.  If worsening, call Dr. Izora Ribas with NSG.  Otherwise contact Dr. Sidney Ace with the hospitalist service to confirm [facility] admission.   ----------------------------------------- 4:00 AM on 02/07/2020 -----------------------------------------  Stable subdural hematoma.  Informing Dr. Sidney Ace by secure chat so he can admit.  No need to call Dr. Izora Ribas again.   Hinda Kehr, MD 02/07/20 0400

## 2020-02-06 NOTE — Consult Note (Signed)
Full note to follow.  79 yo female with dementia suffered syncopal episode.  Brought to ER for evaluation, where CT showed small subdural hematoma.  Previously seen at [facility] with subdural hematoma recently.  CT reviewed - small mostly chronic subdural hematoma without significant mass effect  A/P) 79 yo femal with syncopal event and prior history of subdural hematoma with re-demonstration of mostly chronic fluid collection  - consider AED's for 1 week - hold anticoagulatin - no need for surgery given small size of subdural - noncontrast head CT in 6 hours. If stable, no further imaging - likely will need to be admitted to hospitalist for syncopal evaluation.  Meade Maw MD

## 2020-02-06 NOTE — ED Notes (Signed)
Pt placed in [place] bed until room 4 can be cleaned.

## 2020-02-07 ENCOUNTER — Observation Stay: Payer: Medicare Other

## 2020-02-07 ENCOUNTER — Emergency Department: Payer: Medicare Other

## 2020-02-07 DIAGNOSIS — R55 Syncope and collapse: Secondary | ICD-10-CM | POA: Diagnosis not present

## 2020-02-07 DIAGNOSIS — I10 Essential (primary) hypertension: Secondary | ICD-10-CM | POA: Diagnosis not present

## 2020-02-07 DIAGNOSIS — S065X9A Traumatic subdural hemorrhage with loss of consciousness of unspecified duration, initial encounter: Secondary | ICD-10-CM | POA: Diagnosis not present

## 2020-02-07 DIAGNOSIS — R569 Unspecified convulsions: Secondary | ICD-10-CM | POA: Diagnosis not present

## 2020-02-07 DIAGNOSIS — S065XAA Traumatic subdural hemorrhage with loss of consciousness status unknown, initial encounter: Secondary | ICD-10-CM

## 2020-02-07 DIAGNOSIS — R519 Headache, unspecified: Secondary | ICD-10-CM | POA: Diagnosis not present

## 2020-02-07 DIAGNOSIS — I6523 Occlusion and stenosis of bilateral carotid arteries: Secondary | ICD-10-CM | POA: Diagnosis not present

## 2020-02-07 LAB — SARS CORONAVIRUS 2 BY RT PCR (HOSPITAL ORDER, PERFORMED IN ~~LOC~~ HOSPITAL LAB): SARS Coronavirus 2: NEGATIVE

## 2020-02-07 LAB — TSH: TSH: 0.688 u[IU]/mL (ref 0.350–4.500)

## 2020-02-07 MED ORDER — MEMANTINE HCL 5 MG PO TABS
5.0000 mg | ORAL_TABLET | Freq: Every day | ORAL | Status: DC
  Administered 2020-02-07: 5 mg via ORAL
  Filled 2020-02-07: qty 1

## 2020-02-07 MED ORDER — SODIUM CHLORIDE 0.9 % IV SOLN: INTRAVENOUS | Status: DC

## 2020-02-07 MED ORDER — ACETAMINOPHEN 325 MG PO TABS: 650.0000 mg | ORAL_TABLET | Freq: Four times a day (QID) | ORAL | Status: DC | PRN

## 2020-02-07 MED ORDER — ACETAMINOPHEN 650 MG RE SUPP: 650.0000 mg | Freq: Four times a day (QID) | RECTAL | Status: DC | PRN

## 2020-02-07 MED ORDER — ONDANSETRON HCL 4 MG/2ML IJ SOLN: 4.0000 mg | Freq: Four times a day (QID) | INTRAMUSCULAR | Status: DC | PRN

## 2020-02-07 MED ORDER — DONEPEZIL HCL 5 MG PO TABS: 10.0000 mg | ORAL_TABLET | Freq: Every day | ORAL | Status: DC

## 2020-02-07 MED ORDER — LEVETIRACETAM 500 MG PO TABS
500.0000 mg | ORAL_TABLET | Freq: Two times a day (BID) | ORAL | Status: DC
  Filled 2020-02-07: qty 1

## 2020-02-07 MED ORDER — SERTRALINE HCL 50 MG PO TABS
50.0000 mg | ORAL_TABLET | Freq: Every day | ORAL | Status: DC
  Administered 2020-02-07: 50 mg via ORAL
  Filled 2020-02-07: qty 1

## 2020-02-07 MED ORDER — ENALAPRIL MALEATE 10 MG PO TABS
5.0000 mg | ORAL_TABLET | Freq: Every day | ORAL | Status: DC
  Administered 2020-02-07: 5 mg via ORAL
  Filled 2020-02-07: qty 1

## 2020-02-07 MED ORDER — TRAZODONE HCL 50 MG PO TABS: 25.0000 mg | ORAL_TABLET | Freq: Every evening | ORAL | Status: DC | PRN

## 2020-02-07 MED ORDER — MAGNESIUM HYDROXIDE 400 MG/5ML PO SUSP: 30.0000 mL | Freq: Every day | ORAL | Status: DC | PRN

## 2020-02-07 MED ORDER — ONDANSETRON HCL 4 MG PO TABS: 4.0000 mg | ORAL_TABLET | Freq: Four times a day (QID) | ORAL | Status: DC | PRN

## 2020-02-07 NOTE — Care Management Important Message (Signed)
Important Message  Patient Details  Name: Elaine [name] MRN: [id number] Date of Birth: 1941/05/04   Medicare Important Message Given:        Anselm Pancoast, RN 02/07/2020, 1:39 PM

## 2020-02-07 NOTE — Consult Note (Signed)
Referring Physician:  No referring provider defined for this encounter.  Primary Physician:  Virginia Crews, MD  Chief Complaint:    History of Present Illness: Elaine [name] is a 79 y.o. female with PMH of Alzheimer's disease, hypertension, who presents with the chief complaint of loss of consciousness.   [name] has a known history of subdural hemorrhage in February 2021, for which she was admitted to [facility] and conservatively managed.    She reports an acute onset of syncope while riding with her female friend in the car. She does not recall the incident however her friend in the room gives account.  She reports that Ms Mckinlee lost consciousness for less than 15 seconds and was noted to have left arm shaking movements. She did not have any falls or head trauma during this event.   CT from 02/06/2020 shows small mostly chronic subdural hematoma without significant mass effect.  Repeat head CT shows stable SDH.  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  Neurosurgery was consulted for recommendations based on imaging.   Review of Systems:  A 10 point review of systems is negative, except for the pertinent positives and negatives detailed in the HPI.  Past Medical History: Past Medical History:  Diagnosis Date  . Alzheimer disease ([place])   . Depression   . Hypertension     Past Surgical History: Past Surgical History:  Procedure Laterality Date  . ANKLE SURGERY    . AUGMENTATION MAMMAPLASTY Bilateral 2006  . BREAST BIOPSY Right 1984   EXCISIONAL - NEG  . BREAST ENHANCEMENT SURGERY Bilateral   . HEMORRHOID SURGERY  2011   Hemorrhoidectomy  . TONSILLECTOMY AND ADENOIDECTOMY    . TUBAL LIGATION      Allergies: Allergies as of 02/06/2020  . (No Known Allergies)    Medications:  Current Facility-Administered Medications:  .  0.9 %  sodium chloride infusion, , Intravenous, Continuous, Mansy, Jan A, MD, Last  Rate: 100 mL/hr at 02/07/20 0617, New Bag at 02/07/20 0617 .  acetaminophen (TYLENOL) tablet 650 mg, 650 mg, Oral, Q6H PRN **OR** acetaminophen (TYLENOL) suppository 650 mg, 650 mg, Rectal, Q6H PRN, Mansy, Jan A, MD .  donepezil (ARICEPT) tablet 10 mg, 10 mg, Oral, QHS, Mansy, Jan A, MD .  enalapril (VASOTEC) tablet 5 mg, 5 mg, Oral, Daily, Mansy, Jan A, MD .  levETIRAcetam (KEPPRA) tablet 500 mg, 500 mg, Oral, BID, Mansy, Jan A, MD .  magnesium hydroxide (MILK OF MAGNESIA) suspension 30 mL, 30 mL, Oral, Daily PRN, Mansy, Jan A, MD .  memantine [facility]) tablet 5 mg, 5 mg, Oral, Daily, Mansy, Jan A, MD .  ondansetron (ZOFRAN) tablet 4 mg, 4 mg, Oral, Q6H PRN **OR** ondansetron (ZOFRAN) injection 4 mg, 4 mg, Intravenous, Q6H PRN, Mansy, Jan A, MD .  sertraline (ZOLOFT) tablet 50 mg, 50 mg, Oral, Daily, Mansy, Jan A, MD .  sodium chloride flush (NS) 0.9 % injection 3 mL, 3 mL, Intravenous, Once, Vanessa Meigs, MD .  traZODone (DESYREL) tablet 25 mg, 25 mg, Oral, QHS PRN, Mansy, Jan A, MD  Current Outpatient Medications:  .  donepezil (ARICEPT) 10 MG tablet, Take 1 tablet (10 mg total) by mouth at bedtime., Disp: 90 tablet, Rfl: 1 .  enalapril (VASOTEC) 5 MG tablet, TAKE 1 TABLET BY MOUTH  EVERY DAY (Patient taking differently: Take 5 mg by mouth daily. ), Disp: 90 tablet, Rfl: 3 .  memantine (NAMENDA) 5 MG  tablet, TAKE 1 TABLET BY MOUTH EVERY DAY, Disp: 90 tablet, Rfl: 1 .  sertraline (ZOLOFT) 50 MG tablet, TAKE 1 TABLET BY MOUTH  DAILY (Patient taking differently: Take 50 mg by mouth daily. ), Disp: 90 tablet, Rfl: 3   Social History: Social History   Tobacco Use  . Smoking status: Never Smoker  . Smokeless tobacco: Never Used  Substance Use Topics  . Alcohol use: Yes    Alcohol/week: 0.0 - 7.0 standard drinks    Comment: up to 1 drink daily  . Drug use: No    Family Medical History: Family History  Problem Relation Age of Onset  . Heart attack Father   . Hypertension Sister         pulmonary HTN  . Breast cancer Maternal Aunt     Physical Examination: Vitals:   02/07/20 0601 02/07/20 0800  BP: (!) 129/103 117/62  Pulse:  (!) 52  Resp:  18  Temp:    SpO2:  96%     General: Patient is well developed, well nourished, calm, collected, and in no apparent distress.  Psychiatric: Patient is non-anxious.  Head:  Pupils equal, round, and reactive to light.  ENT:  Oral mucosa appears well hydrated.  Neck:   Supple.  Full range of motion. No TTP over cervical spine.  Respiratory: Patient is breathing without any difficulty.  Extremities: No edema.  Vascular: Palpable pulses in dorsal pedal vessels.  Skin:   On exposed skin, there are no abnormal skin lesions.  NEUROLOGICAL:  General: In no acute distress.   Awake, alert, oriented to person, place, and time.  Pupils equal round and reactive to light.   EOMI without nystagmus.  Facial tone is symmetric.  Tongue protrusion is midline.  There is no pronator drift.  Speech is fluent, no dysarthria. Names 3/3 objects correctly, able to repeat phrases. Can count backwards from 10 without difficulty.   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   Finger to nose test bilaterally without ataxia. Strength to extremities is 5/5 and symmetric.  Imaging:  CT 02/06/2020: IMPRESSION: 1. Subdural collection extending across the left frontal convexity up to 7 mm in maximal thickness. Mixed high attenuation material seen more inferiorly within the collection could reflect some more acute to subacute hemorrhagic products admixed with more chronic subdural fluid. Only mild effacement of the adjacent sulci without significant midline shift. 2. Encephalomalacia of the left frontal lobe reflecting expected progression of the hemorrhagic contusive change seen on comparison CT. 3. 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 4. Background of chronic microvascular angiopathy and parenchymal volume loss. 5.  Trace dependent mastoid effusions bilaterally.  Repeat head CT from 02/07/2020:  IMPRESSION: Stable mixed density left subdural hematoma measuring up to 6 mm. No midline shift.   Assessment and Plan: Ms. Manwarren is a pleasant 79 y.o. female with a mostly chronic SDH which remains stable on repeat head CT this morning.  - consider AED's for 1 week - hold anticoagulatin - no need for surgery given small size of subdural - no further head imaging at this time unless change in neurologic exam - likely will need to be admitted to hospitalist for syncopal evaluation   We will see the patient back in four weeks with repeat head CT.    Lonell Face, NP Dept. of Neurosurgery

## 2020-02-07 NOTE — Evaluation (Signed)
Physical Therapy Evaluation Patient Details Name: Elaine [name] MRN: [id number] DOB: [birth year] Today's Date: 02/07/2020   History of Present Illness  Patient is a 79 year old female who presented to the emergency room with acute onset of syncope while riding in the car with her husband. She lost conciousness for <15 seconds with left arm contracture and shaking movements.  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 PMH of subdural hemorrhage (09/2019) with conservative management, Alzheimer's disease, depression, HTN.  Clinical Impression  Patient is a very pleasant 79 year old female who presents for syncope and subdural hematoma. Patient is accompanied in room by daughter and lives in a house with four steps to enter and a single handrail, she lives on the first floor of her home and does not use any assistive devices at baseline. Per daughter and patient she is at baseline level of mobility and function. She demonstrates indep ambulation, transfers, and bed mobility with no episodes of dizziness, unsteadiness, or weakness. Patient will not require further physical therapy care at this time due to return to baseline, we will be happy to see this patient again if status or needs change.     Follow Up Recommendations No PT follow up    Equipment Recommendations  None recommended by PT    Recommendations for Other Services       Precautions / Restrictions Precautions Precautions: None Precaution Comments: was on seizure, but has been taken off Restrictions Weight Bearing Restrictions: No      Mobility  Bed Mobility Overal bed mobility: Independent             General bed mobility comments: supine to sit and sit to supine ind.  Transfers Overall transfer level: Independent Equipment used: None             General transfer comment: STS with supervision, no  dizziness  Ambulation/Gait Ambulation/Gait assistance: Min guard;Supervision Gait Distance (Feet): 45 Feet Assistive device: None Gait Pattern/deviations: WFL(Within Functional Limits) Gait velocity: WFL ~1.0 m/s   General Gait Details: functional gait pattern and speed, slight slower turns due to IV/wire's  Stairs            Wheelchair Mobility    Modified Rankin (Stroke Patients Only)       Balance Overall balance assessment: Independent (able to ambulate and perform head turns without LOB or unsteadiness)                                           Pertinent Vitals/Pain Pain Assessment: No/denies pain    Home Living Family/patient expects to be discharged to:: Private residence Living Arrangements: Spouse/significant other Available Help at Discharge: Family;Friend(s) Type of Home: House Home Access: Stairs to enter Entrance Stairs-Rails: Right Entrance Stairs-Number of Steps: 4 Home Layout: Two level;Able to live on main level with bedroom/bathroom Home Equipment: Grab bars - tub/shower;Grab bars - toilet;Shower seat Additional Comments: Patient is independent with only 1 fall in February, lives with her "friend"    Prior Function Level of Independence: Independent   Gait / Transfers Assistance Needed: indep with all mobility and transfers  ADL's / Homemaking Assistance Needed: has help with grocery shopping  Comments: Patient has someone help with grocery shopping but is ind with everything else.     Hand Dominance  Extremity/Trunk Assessment   Upper Extremity Assessment Upper Extremity Assessment: Overall WFL for tasks assessed    Lower Extremity Assessment Lower Extremity Assessment: Overall WFL for tasks assessed (grossly 4/5 good coordination and sequencing)    Cervical / Trunk Assessment Cervical / Trunk Assessment: Normal  Communication   Communication: No difficulties  Cognition Arousal/Alertness:  Awake/alert Behavior During Therapy: WFL for tasks assessed/performed Overall Cognitive Status: Within Functional Limits for tasks assessed                                 General Comments: awake and eager to go home      General Comments General comments (skin integrity, edema, etc.): patient appears well nourished and groomed.    Exercises Other Exercises Other Exercises: Patient and daughter educated on role of PT in acute care setting, safe transfers and mobility, safety awareness for reduction of fall risk.   Assessment/Plan    PT Assessment Patent does not need any further PT services  PT Problem List         PT Treatment Interventions      PT Goals (Current goals can be found in the Care Plan section)  Acute Rehab PT Goals Patient Stated Goal: to return home PT Goal Formulation: With patient Time For Goal Achievement: 02/21/20 Potential to Achieve Goals: Good    Frequency     Barriers to discharge        Co-evaluation               AM-PAC PT "6 Clicks" Mobility  Outcome Measure Help needed turning from your back to your side while in a flat bed without using bedrails?: None Help needed moving from lying on your back to sitting on the side of a flat bed without using bedrails?: None Help needed moving to and from a bed to a chair (including a wheelchair)?: None Help needed standing up from a chair using your arms (e.g., wheelchair or bedside chair)?: None Help needed to walk in hospital room?: None Help needed climbing 3-5 steps with a railing? : A Little 6 Click Score: 23    End of Session Equipment Utilized During Treatment: Gait belt Activity Tolerance: Patient tolerated treatment well Patient left: in bed;with call bell/phone within reach;with family/visitor present Nurse Communication: Mobility status PT Visit Diagnosis: Other abnormalities of gait and mobility (R26.89);Dizziness and giddiness (R42)    Time: 6578-4696 PT Time  Calculation (min) (ACUTE ONLY): 16 min   Charges:   PT Evaluation $PT Eval Low Complexity: [place], PT, DPT   02/07/2020, 1:41 PM

## 2020-02-07 NOTE — Consult Note (Signed)
Reason for Consult: syncopal episode  Requesting Physician: Dr. Sidney Ace  CC: sycnope    HPI: Elaine [name] is an 79 y.o. female  with a known history of fall along with subdural hemorrhage on L side in February of this year for which she was admitted to [facility] and conservatively managed, Alzheimer's disease, depression and hypertension, presented to the emergency room with acute onset of syncope while riding with her husband in the car when she lost consciousness for less than 15 seconds and was noted to have left arm contracture and shaking movements by her husband.  Now back to baseline.    Past Medical History:  Diagnosis Date  . Alzheimer disease ([place])   . Depression   . Hypertension     Past Surgical History:  Procedure Laterality Date  . ANKLE SURGERY    . AUGMENTATION MAMMAPLASTY Bilateral 2006  . BREAST BIOPSY Right 1984   EXCISIONAL - NEG  . BREAST ENHANCEMENT SURGERY Bilateral   . HEMORRHOID SURGERY  2011   Hemorrhoidectomy  . TONSILLECTOMY AND ADENOIDECTOMY    . TUBAL LIGATION      Family History  Problem Relation Age of Onset  . Heart attack Father   . Hypertension Sister        pulmonary HTN  . Breast cancer Maternal Aunt     Social History:  reports that she has never smoked. She has never used smokeless tobacco. She reports current alcohol use. She reports that she does not use drugs.  No Known Allergies  Medications: I have reviewed the patient's current medications.  ROS: History obtained from the patient  General ROS: negative for - chills, fatigue, fever, night sweats, weight gain or weight loss Psychological ROS: negative for - behavioral disorder, hallucinations, memory difficulties, mood swings or suicidal ideation Ophthalmic ROS: negative for - blurry vision, double vision, eye pain or loss of vision ENT ROS: negative for - epistaxis, nasal discharge, oral lesions, sore throat, tinnitus or vertigo Allergy and Immunology ROS: negative for -  hives or itchy/watery eyes Hematological and Lymphatic ROS: negative for - bleeding problems, bruising or swollen lymph nodes Endocrine ROS: negative for - galactorrhea, hair pattern changes, polydipsia/polyuria or temperature intolerance Respiratory ROS: negative for - cough, hemoptysis, shortness of breath or wheezing Cardiovascular ROS: negative for - chest pain, dyspnea on exertion, edema or irregular heartbeat Gastrointestinal ROS: negative for - abdominal pain, diarrhea, hematemesis, nausea/vomiting or stool incontinence Genito-Urinary ROS: negative for - dysuria, hematuria, incontinence or urinary frequency/urgency Musculoskeletal ROS: negative for - joint swelling or muscular weakness Neurological ROS: as noted in HPI Dermatological ROS: negative for rash and skin lesion changes  Physical Examination: Blood pressure 139/62, pulse (!) 52, temperature 99.1 F (37.3 C), temperature source Oral, resp. rate 18, height 5\' 3"  (1.6 m), weight 63.5 kg, SpO2 96 %.   Neurological Examination   Mental Status: Alert, oriented, thought content appropriate.  Speech fluent without evidence of aphasia.  Able to follow 3 step commands without difficulty. Cranial Nerves: II: Discs flat bilaterally; Visual fields grossly normal, pupils equal, round, reactive to light and accommodation III,IV, VI: ptosis not present, extra-ocular motions intact bilaterally V,VII: smile symmetric, facial light touch sensation normal bilaterally VIII: hearing normal bilaterally IX,X: gag reflex present XI: bilateral shoulder shrug XII: midline tongue extension Motor: Right : Upper extremity   5/5    Left:     Upper extremity   5/5  Lower extremity   5/5     Lower extremity  5/5 Tone and bulk:normal tone throughout; no atrophy noted Sensory: Pinprick and light touch intact throughout, bilaterally Deep Tendon Reflexes: 2+ and symmetric throughout Plantars: Right: downgoing   Left: downgoing Cerebellar: normal  finger-to-nose, normal rapid alternating movements and normal heel-to-shin test Gait: normal gait and station      Laboratory Studies:   Basic Metabolic Panel: Recent Labs  Lab 02/06/20 1929  NA 139  K 3.7  CL 107  CO2 23  GLUCOSE 88  BUN 21  CREATININE 0.66  CALCIUM 8.9    Liver Function Tests: No results for input(s): AST, ALT, ALKPHOS, BILITOT, PROT, ALBUMIN in the last 168 hours. No results for input(s): LIPASE, AMYLASE in the last 168 hours. No results for input(s): AMMONIA in the last 168 hours.  CBC: Recent Labs  Lab 02/06/20 1929  WBC 5.8  HGB 12.9  HCT 38.1  MCV 91.6  PLT 175    Cardiac Enzymes: No results for input(s): CKTOTAL, CKMB, CKMBINDEX, TROPONINI in the last 168 hours.  BNP: Invalid input(s): POCBNP  CBG: Recent Labs  Lab 02/06/20 1940  GLUCAP 7    Microbiology: Results for orders placed or performed during the hospital encounter of 02/06/20  SARS Coronavirus 2 by RT PCR (hospital order, performed in [facility] hospital lab) Nasopharyngeal Nasopharyngeal Swab     Status: None   Collection Time: 02/06/20 11:14 PM   Specimen: Nasopharyngeal Swab  Result Value Ref Range Status   SARS Coronavirus 2 NEGATIVE NEGATIVE Final    Comment: (NOTE) SARS-CoV-2 target nucleic acids are NOT DETECTED.  The SARS-CoV-2 RNA is generally detectable in upper and lower respiratory specimens during the acute phase of infection. The lowest concentration of SARS-CoV-2 viral copies this assay can detect is 250 copies / mL. A negative result does not preclude SARS-CoV-2 infection and should not be used as the sole basis for treatment or other patient management decisions.  A negative result may occur with improper specimen collection / handling, submission of specimen other than nasopharyngeal swab, presence of viral mutation(s) within the areas targeted by this assay, and inadequate number of viral copies (<250 copies / mL). A negative result must be  combined with clinical observations, patient history, and epidemiological information.  Fact Sheet for Patients:   StrictlyIdeas.no  Fact Sheet for Healthcare Providers: BankingDealers.co.za  This test is not yet approved or  cleared by the [place] FDA and has been authorized for detection and/or diagnosis of SARS-CoV-2 by FDA under an Emergency Use Authorization (EUA).  This EUA will remain in effect (meaning this test can be used) for the duration of the COVID-19 declaration under Section 564(b)(1) of the Act, 21 U.S.C. section 360bbb-3(b)(1), unless the authorization is terminated or revoked sooner.  Performed at [facility], [address] [place], [place] [address]     Coagulation Studies: No results for input(s): LABPROT, INR in the last 72 hours.  Urinalysis:  Recent Labs  Lab 02/06/20 1929  COLORURINE YELLOW*  LABSPEC 1.020  PHURINE 5.0  GLUCOSEU NEGATIVE  HGBUR SMALL*  BILIRUBINUR NEGATIVE  KETONESUR NEGATIVE  PROTEINUR NEGATIVE  NITRITE NEGATIVE  LEUKOCYTESUR NEGATIVE    Lipid Panel:     Component Value Date/Time   CHOL 199 09/02/2019 1125   TRIG 79 09/02/2019 1125   HDL 35 (L) 09/02/2019 1125   CHOLHDL 5.7 (H) 09/02/2019 1125   LDLCALC 150 (H) 09/02/2019 1125    HgbA1C:  Lab Results  Component Value Date   HGBA1C 5.3 12/11/2015  Urine Drug Screen:  No results found for: LABOPIA, COCAINSCRNUR, LABBENZ, AMPHETMU, THCU, LABBARB  Alcohol Level: No results for input(s): ETH in the last 168 hours.  Other results: EKG: normal EKG, normal sinus rhythm, unchanged from previous tracings.  Imaging: CT Head Wo Contrast  Result Date: 02/07/2020 CLINICAL DATA:  Headache, head trauma EXAM: CT HEAD WITHOUT CONTRAST TECHNIQUE: Contiguous axial images were obtained from the base of the skull through the vertex without intravenous contrast. COMPARISON:  02/06/2020 FINDINGS: Brain: Mixed density  left subdural hematoma again noted, measuring 6 mm in maximum thickness. No significant change since prior study. No midline shift. Encephalomalacia in the left frontal lobe, stable. No acute intracranial abnormality. Specifically, no hemorrhage, hydrocephalus, mass lesion, acute infarction, or significant intracranial injury. Vascular: No hyperdense vessel or unexpected calcification. Skull: No acute calvarial abnormality. Sinuses/Orbits: Visualized paranasal sinuses and mastoids clear. Orbital soft tissues unremarkable. Other: None IMPRESSION: Stable mixed density left subdural hematoma measuring up to 6 mm. No midline shift. No acute intracranial abnormality. Electronically Signed   By: Rolm Baptise M.D.   On: 02/07/2020 03:28   CT Head Wo Contrast  Result Date: 02/06/2020 CLINICAL DATA:  Recurrent syncope, mild headache EXAM: CT HEAD WITHOUT CONTRAST TECHNIQUE: Contiguous axial images were obtained from the base of the skull through the vertex without intravenous contrast. COMPARISON:  CT 10/08/2019 FINDINGS: Brain: There is a 5.7 cm subdural collection extending across the left frontal convexity with some mixed attenuation material seen more inferiorly within the collection as it extends across the temporal lobe and inferior parietal. While much of this could reflect residual from a subdural hematoma seen on comparison, the more hyperdense material could reflect some more acute to subacute hemorrhagic products. Only mild effacement of the adjacent sulci without significant midline shift.No other convincing sites of hemorrhage or mass effect. Encephalomalacia of the left frontal lobe reflecting expected progression of the hemorrhagic contusive change seen on comparison CT. 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 No new sites concerning for acute CT evident infarct. Findings on a background of chronic microvascular change with patchy periventricular and deep white matter  hypoattenuation. Symmetric prominence of the ventricles, cisterns and sulci compatible with parenchymal volume loss. Vascular: Atherosclerotic calcification of the carotid siphons. No hyperdense vessel. Skull: No calvarial fracture or suspicious osseous lesion. No scalp swelling or hematoma. Sinuses/Orbits: Paranasal sinuses are predominantly clear. Trace dependent mastoid effusions bilaterally. Middle ear cavities are clear. Included orbital structures are unremarkable. Other: Bilateral TMJ arthrosis. IMPRESSION: 1. Subdural collection extending across the left frontal convexity up to 7 mm in maximal thickness. Mixed high attenuation material seen more inferiorly within the collection could reflect some more acute to subacute hemorrhagic products admixed with more chronic subdural fluid. Only mild effacement of the adjacent sulci without significant midline shift. 2. Encephalomalacia of the left frontal lobe reflecting expected progression of the hemorrhagic contusive change seen on comparison CT. 3. 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 4. Background of chronic microvascular angiopathy and parenchymal volume loss. 5. Trace dependent mastoid effusions bilaterally. Critical Value/emergent results were called by telephone at the time of interpretation on 02/06/2020 at 9:11 pm to provider [facility] , who verbally acknowledged these results. Electronically Signed   By: Lovena Le M.D.   On: 02/06/2020 21:11     Assessment/Plan:  79 y.o. female  with a known history of fall along with subdural hemorrhage on L side in February of this year for which she  was admitted to [facility] and conservatively managed, Alzheimer's disease, depression and hypertension, presented to the emergency room with acute onset of syncope while riding with her husband in the car when she lost consciousness for less than 15 seconds and was noted to have left arm contracture and shaking movements by her husband.  Now  back to baseline.    - Back to baseline as per pt and family at bedside - don't think that was seizure activity. Pt was shaking L arm whiled SDH and encephalomalacia is also on L side - orthostatics being done - CTH with mixed density SDH mostly chronic - don't think there is a need for anti epileptics as this is not acute SDH - d/c planning form Neurological stand point. Pt is asking to be discharged.  02/07/2020, 11:09 AM

## 2020-02-07 NOTE — Care Management CC44 (Signed)
Condition Code 44 Documentation Completed  Patient Details  Name: Elaine [name] MRN: [id number] Date of Birth: [birth year]   Condition Code 44 given:  Yes Patient signature on Condition Code 44 notice:  Yes Documentation of 2 MD's agreement:  Yes Code 44 added to claim:  Yes    Anselm Pancoast, RN 02/07/2020, 1:38 PM

## 2020-02-07 NOTE — H&P (Signed)
[facility] [place] NAME: Elaine [name]    MR#:  [id number]  DATE OF BIRTH:  10/05/1940  DATE OF ADMISSION:  02/06/2020  PRIMARY CARE PHYSICIAN: Virginia Crews, MD   REQUESTING/REFERRING PHYSICIAN: Hinda Kehr, MD/[place], Stanton Kidney, MD CHIEF COMPLAINT:   Chief Complaint  Patient presents with   Near Syncope    HISTORY OF PRESENT ILLNESS:  Elaine [name]  is a 79 y.o. Caucasian female with a known history of subdural hemorrhage in February of this year for which she was admitted to [facility] and conservatively managed, Alzheimer's disease, depression and hypertension, presented to the emergency room with acute onset of syncope while riding with her husband in the car when she lost consciousness for less than 15 seconds and was noted to have left arm contracture and shaking movements by her husband.  There was no tongue bite tingling, urinary or stool incontinence.  She did not have any falls or head injuries.  No paresthesias or focal muscle weakness.  Upon presentation to the emergency room, heart rate was 64 and blood pressure was 140/91 with otherwise normal vital signs.  Labs revealed later unremarkable UA CBC BMP.  Noncontrasted head CT scan revealed the following: 1. Subdural collection extending across the left frontal convexity up to 7 mm in maximal thickness. Mixed high attenuation material seen more inferiorly within the collection could reflect some more acute to subacute hemorrhagic products admixed with more chronic subdural fluid. Only mild effacement of the adjacent sulci without significant midline shift. 2. Encephalomalacia of the left frontal lobe reflecting expected progression of the hemorrhagic contusive change seen on comparison CT. 3. 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 4. Background of chronic microvascular angiopathy and parenchymal volume loss. 5. Trace dependent mastoid effusions bilaterally.  Dr. Izora Ribas  was consulted over the phone and recommended repeat noncontrasted head CT scan 6 hours from the first 1.  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  She will be placed in observation progressive unit bed for further evaluation and management.  PAST MEDICAL HISTORY:   Past Medical History:  Diagnosis Date   Alzheimer disease ([place])    Depression    Hypertension     PAST SURGICAL HISTORY:   Past Surgical History:  Procedure Laterality Date   ANKLE SURGERY     AUGMENTATION MAMMAPLASTY Bilateral 2006   BREAST BIOPSY Right 1984   EXCISIONAL - NEG   BREAST ENHANCEMENT SURGERY Bilateral    HEMORRHOID SURGERY  2011   Hemorrhoidectomy   TONSILLECTOMY AND ADENOIDECTOMY     TUBAL LIGATION      SOCIAL HISTORY:   Social History   Tobacco Use   Smoking status: Never Smoker   Smokeless tobacco: Never Used  Substance Use Topics   Alcohol use: Yes    Alcohol/week: 0.0 - 7.0 standard drinks    Comment: up to 1 drink daily    FAMILY HISTORY:   Family History  Problem Relation Age of Onset   Heart attack Father    Hypertension Sister        pulmonary HTN   Breast cancer Maternal Aunt     DRUG ALLERGIES:  No Known Allergies  REVIEW OF SYSTEMS:   ROS As per history of present illness. All pertinent systems were reviewed above. Constitutional,  HEENT, cardiovascular, respiratory, GI, GU, musculoskeletal, neuro, psychiatric, endocrine,  integumentary  and hematologic systems were reviewed and are otherwise  negative/unremarkable except for positive findings mentioned above in the HPI.   MEDICATIONS AT HOME:   Prior to Admission medications   Medication Sig Start Date End Date Taking? Authorizing Provider  donepezil (ARICEPT) 10 MG tablet Take 1 tablet (10 mg total) by mouth at bedtime. 01/30/20  Yes Bacigalupo, Dionne Bucy, MD  enalapril  (VASOTEC) 5 MG tablet TAKE 1 TABLET BY MOUTH  EVERY DAY Patient taking differently: Take 5 mg by mouth daily.  07/01/19  Yes Bacigalupo, Dionne Bucy, MD  memantine (NAMENDA) 5 MG tablet TAKE 1 TABLET BY MOUTH EVERY DAY 12/05/19  Yes Bacigalupo, Dionne Bucy, MD  sertraline (ZOLOFT) 50 MG tablet TAKE 1 TABLET BY MOUTH  DAILY Patient taking differently: Take 50 mg by mouth daily.  07/01/19  Yes Bacigalupo, Dionne Bucy, MD      VITAL SIGNS:  Blood pressure (!) 114/91, pulse (!) 49, temperature 99.1 F (37.3 C), temperature source Oral, resp. rate 16, height 5\' 3"  (1.6 m), weight 63.5 kg, SpO2 95 %.  PHYSICAL EXAMINATION:  Physical Exam  GENERAL:  79 y.o.-year-old Caucasian female patient lying in the bed with no acute distress.  EYES: Pupils equal, round, reactive to light and accommodation. No scleral icterus. Extraocular muscles intact.  HEENT: Head atraumatic, normocephalic. Oropharynx and nasopharynx clear.  NECK:  Supple, no jugular venous distention. No thyroid enlargement, no tenderness.  LUNGS: Normal breath sounds bilaterally, no wheezing, rales,rhonchi or crepitation. No use of accessory muscles of respiration.  CARDIOVASCULAR: Regular rate and rhythm, S1, S2 normal. No murmurs, rubs, or gallops.  ABDOMEN: Soft, nondistended, nontender. Bowel sounds present. No organomegaly or mass.  EXTREMITIES: No pedal edema, cyanosis, or clubbing.  NEUROLOGIC: Cranial nerves II through XII are intact. Muscle strength 5/5 in all extremities. Sensation intact. Gait not checked.  PSYCHIATRIC: The patient is alert and oriented x 3.  Normal affect and good eye contact. SKIN: No obvious rash, lesion, or ulcer.   LABORATORY PANEL:   CBC Recent Labs  Lab 02/06/20 1929  WBC 5.8  HGB 12.9  HCT 38.1  PLT 175   ------------------------------------------------------------------------------------------------------------------  Chemistries  Recent Labs  Lab 02/06/20 1929  NA 139  K 3.7  CL 107  CO2 23    GLUCOSE 88  BUN 21  CREATININE 0.66  CALCIUM 8.9   ------------------------------------------------------------------------------------------------------------------  Cardiac Enzymes No results for input(s): TROPONINI in the last 168 hours. ------------------------------------------------------------------------------------------------------------------  RADIOLOGY:  CT Head Wo Contrast  Result Date: 02/07/2020 CLINICAL DATA:  Headache, head trauma EXAM: CT HEAD WITHOUT CONTRAST TECHNIQUE: Contiguous axial images were obtained from the base of the skull through the vertex without intravenous contrast. COMPARISON:  02/06/2020 FINDINGS: Brain: Mixed density left subdural hematoma again noted, measuring 6 mm in maximum thickness. No significant change since prior study. No midline shift. Encephalomalacia in the left frontal lobe, stable. No acute intracranial abnormality. Specifically, no hemorrhage, hydrocephalus, mass lesion, acute infarction, or significant intracranial injury. Vascular: No hyperdense vessel or unexpected calcification. Skull: No acute calvarial abnormality. Sinuses/Orbits: Visualized paranasal sinuses and mastoids clear. Orbital soft tissues unremarkable. Other: None IMPRESSION: Stable mixed density left subdural hematoma measuring up to 6 mm. No midline shift. No acute intracranial abnormality. Electronically Signed   By: Rolm Baptise M.D.   On: 02/07/2020 03:28   CT Head Wo Contrast  Result Date: 02/06/2020 CLINICAL DATA:  Recurrent syncope, mild headache EXAM: CT HEAD WITHOUT CONTRAST TECHNIQUE: Contiguous axial images were obtained from the base of the  skull through the vertex without intravenous contrast. COMPARISON:  CT 10/08/2019 FINDINGS: Brain: There is a 5.7 cm subdural collection extending across the left frontal convexity with some mixed attenuation material seen more inferiorly within the collection as it extends across the temporal lobe and inferior parietal. While  much of this could reflect residual from a subdural hematoma seen on comparison, the more hyperdense material could reflect some more acute to subacute hemorrhagic products. Only mild effacement of the adjacent sulci without significant midline shift.No other convincing sites of hemorrhage or mass effect. Encephalomalacia of the left frontal lobe reflecting expected progression of the hemorrhagic contusive change seen on comparison CT. 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 No new sites concerning for acute CT evident infarct. Findings on a background of chronic microvascular change with patchy periventricular and deep white matter hypoattenuation. Symmetric prominence of the ventricles, cisterns and sulci compatible with parenchymal volume loss. Vascular: Atherosclerotic calcification of the carotid siphons. No hyperdense vessel. Skull: No calvarial fracture or suspicious osseous lesion. No scalp swelling or hematoma. Sinuses/Orbits: Paranasal sinuses are predominantly clear. Trace dependent mastoid effusions bilaterally. Middle ear cavities are clear. Included orbital structures are unremarkable. Other: Bilateral TMJ arthrosis. IMPRESSION: 1. Subdural collection extending across the left frontal convexity up to 7 mm in maximal thickness. Mixed high attenuation material seen more inferiorly within the collection could reflect some more acute to subacute hemorrhagic products admixed with more chronic subdural fluid. Only mild effacement of the adjacent sulci without significant midline shift. 2. Encephalomalacia of the left frontal lobe reflecting expected progression of the hemorrhagic contusive change seen on comparison CT. 3. 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 4. Background of chronic microvascular angiopathy and parenchymal volume loss. 5. Trace dependent mastoid effusions bilaterally. Critical Value/emergent results were called by telephone at the time of  interpretation on 02/06/2020 at 9:11 pm to provider [facility] , who verbally acknowledged these results. Electronically Signed   By: Lovena Le M.D.   On: 02/06/2020 21:11      IMPRESSION AND PLAN:   1.  Syncope with suspected seizures. -The patient will be admitted to observation progressive unit bed. -Differential diagnosis would include arrhythmia related syncope, neurally mediated, TIA/CVA and seizures. -We will obtain 2D echo and bilateral carotid Doppler for further assessment. -We will clean orthostatics every 12 hours. -Physical therapy consult to be obtained. -The patient was started on IV Keppra will be continued on p.o. -Neurology consult will be obtained. -I notified Dr. Irish Elders.  2.  Stable subdural hematoma. -Neurosurgery follow-up will be obtained by Dr. Cari Caraway.  He is aware about the patient.  3.  Alzheimer's dementia -We will continue Aricept and Namenda.  4.  Hypertension. -We will continue Vasotec.  5.  Depression. -We will continue Zoloft.  6.  DVT prophylaxis. -SCDs. -Medical prophylaxis currently contraindicated due to her subdural hematoma.  All the records are reviewed and case discussed with ED provider. The plan of care was discussed in details with the patient (and family). I answered all questions. The patient agreed to proceed with the above mentioned plan. Further management will depend upon hospital course.   CODE STATUS: Full code  Status is: Observation  The patient remains OBS appropriate and will d/c before 2 midnights.  Dispo: The patient is from: Home              Anticipated d/c is to: Home  Anticipated d/c date is: 1 day              Patient currently is not medically stable to d/c.   TOTAL TIME TAKING CARE OF THIS PATIENT: 50 minutes.    Christel Mormon M.D on 02/07/2020 at 6:54 AM  [facility]   From 7 PM-7 AM, contact night-coverage [email or web address]  CC: Primary care physician; Virginia Crews, MD   Note: This dictation was prepared with Dragon dictation along with smaller phrase technology. Any transcriptional typo errors that result from this process are unintentional.

## 2020-02-07 NOTE — Discharge Summary (Signed)
Physician Discharge Summary  Elaine [name] DGL:[id number] DOB: Jun 21, 1941 DOA: 02/06/2020  PCP: Virginia Crews, MD  Admit date: 02/06/2020 Discharge date: 02/07/2020  Admitted From: Home Disposition:  Home  Recommendations for Outpatient Follow-up:  1. Follow up with PCP in 1-2 weeks 2. Please obtain BMP/CBC in one week 3. Please follow up on the following pending results:None  Home Health:No Equipment/Devices: None Discharge Condition: Stable CODE STATUS: Full Diet recommendation: Heart Healthy   Brief/Interim Summary: Elaine [name]  is a 79 y.o. Caucasian female with a known history of subdural hemorrhage in February of this year for which she was admitted to [facility] and conservatively managed, Alzheimer's disease, depression and hypertension, presented to the emergency room with acute onset of syncope while riding with her husband in the car when she lost consciousness for less than 15 seconds and was noted to have left arm contracture and shaking movements by her husband.  There was no tongue bite tingling, urinary or stool incontinence.  She did not have any falls or head injuries.  No paresthesias or focal muscle weakness.  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  Neurosurgery was also consulted and they are not recommending any further management.  She did had some jerky hand movements but I do not think that is due to any seizure-like activity most likely secondary to vasovagal syncopal episode.  As patient told me that she did felt little dizzy before passing out for few seconds.  Patient is having some dizziness going on for a long time.  Occasionally it is related to postural change.  We checked orthostatic vitals and they were within normal limit.  Her ultrasound of carotid was with out any significant stenosis.  EKG without any acute change and troponin remain negative.  No chest pain or shortness of breath.  She appears back to her baseline.  She was  discharged with instructions to keep herself hydrated and follow-up with her PCP.  Discharge Diagnoses:  Active Problems:   Subdural hematoma (HCC)   Syncope   Discharge Instructions  Discharge Instructions    Diet - low sodium heart healthy   Complete by: As directed    Discharge instructions   Complete by: As directed    It was pleasure taking care of you. Please keep yourself well-hydrated and follow-up with your primary care physician. Most of your work-up is within normal limit.  You do not have any significant narrowing of your neck vessels.  Your blood pressure remained within normal limits with change in position.  We do not think that you have any seizure at this time.  If you experience similar symptoms please seek medical attention.   Increase activity slowly   Complete by: As directed      Allergies as of 02/07/2020   No Known Allergies     Medication List    TAKE these medications   donepezil 10 MG tablet Commonly known as: ARICEPT Take 1 tablet (10 mg total) by mouth at bedtime.   enalapril 5 MG tablet Commonly known as: VASOTEC TAKE 1 TABLET BY MOUTH  EVERY DAY   memantine 5 MG tablet Commonly known as: NAMENDA TAKE 1 TABLET BY MOUTH EVERY DAY   sertraline 50 MG tablet Commonly known as: ZOLOFT TAKE 1 TABLET BY MOUTH  DAILY       Follow-up Information    Virginia Crews, MD. Schedule an appointment as soon as possible for a visit.   Specialty: Family Medicine Contact information:  [address] [place] [address] [phone]              No Known Allergies  Consultations:  Neurosurgery  Neurology  Procedures/Studies: CT Head Wo Contrast  Result Date: 02/07/2020 CLINICAL DATA:  Headache, head trauma EXAM: CT HEAD WITHOUT CONTRAST TECHNIQUE: Contiguous axial images were obtained from the base of the skull through the vertex without intravenous contrast. COMPARISON:  02/06/2020 FINDINGS: Brain: Mixed density left  subdural hematoma again noted, measuring 6 mm in maximum thickness. No significant change since prior study. No midline shift. Encephalomalacia in the left frontal lobe, stable. No acute intracranial abnormality. Specifically, no hemorrhage, hydrocephalus, mass lesion, acute infarction, or significant intracranial injury. Vascular: No hyperdense vessel or unexpected calcification. Skull: No acute calvarial abnormality. Sinuses/Orbits: Visualized paranasal sinuses and mastoids clear. Orbital soft tissues unremarkable. Other: None IMPRESSION: Stable mixed density left subdural hematoma measuring up to 6 mm. No midline shift. No acute intracranial abnormality. Electronically Signed   By: Rolm Baptise M.D.   On: 02/07/2020 03:28   CT Head Wo Contrast  Result Date: 02/06/2020 CLINICAL DATA:  Recurrent syncope, mild headache EXAM: CT HEAD WITHOUT CONTRAST TECHNIQUE: Contiguous axial images were obtained from the base of the skull through the vertex without intravenous contrast. COMPARISON:  CT 10/08/2019 FINDINGS: Brain: There is a 5.7 cm subdural collection extending across the left frontal convexity with some mixed attenuation material seen more inferiorly within the collection as it extends across the temporal lobe and inferior parietal. While much of this could reflect residual from a subdural hematoma seen on comparison, the more hyperdense material could reflect some more acute to subacute hemorrhagic products. Only mild effacement of the adjacent sulci without significant midline shift.No other convincing sites of hemorrhage or mass effect. Encephalomalacia of the left frontal lobe reflecting expected progression of the hemorrhagic contusive change seen on comparison CT. 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 No new sites concerning for acute CT evident infarct. Findings on a background of chronic microvascular change with patchy periventricular and deep white matter hypoattenuation.  Symmetric prominence of the ventricles, cisterns and sulci compatible with parenchymal volume loss. Vascular: Atherosclerotic calcification of the carotid siphons. No hyperdense vessel. Skull: No calvarial fracture or suspicious osseous lesion. No scalp swelling or hematoma. Sinuses/Orbits: Paranasal sinuses are predominantly clear. Trace dependent mastoid effusions bilaterally. Middle ear cavities are clear. Included orbital structures are unremarkable. Other: Bilateral TMJ arthrosis. IMPRESSION: 1. Subdural collection extending across the left frontal convexity up to 7 mm in maximal thickness. Mixed high attenuation material seen more inferiorly within the collection could reflect some more acute to subacute hemorrhagic products admixed with more chronic subdural fluid. Only mild effacement of the adjacent sulci without significant midline shift. 2. Encephalomalacia of the left frontal lobe reflecting expected progression of the hemorrhagic contusive change seen on comparison CT. 3. 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 4. Background of chronic microvascular angiopathy and parenchymal volume loss. 5. Trace dependent mastoid effusions bilaterally. Critical Value/emergent results were called by telephone at the time of interpretation on 02/06/2020 at 9:11 pm to provider [facility] , who verbally acknowledged these results. Electronically Signed   By: Lovena Le M.D.   On: 02/06/2020 21:11   US Carotid Bilateral  Result Date: 02/07/2020 CLINICAL DATA:  79 year old female with a history of syncope EXAM: BILATERAL CAROTID DUPLEX ULTRASOUND TECHNIQUE: Pearline Cables scale imaging, color Doppler and duplex ultrasound were performed of bilateral carotid and vertebral  arteries in the neck. COMPARISON:  None. FINDINGS: Criteria: Quantification of carotid stenosis is based on velocity parameters that correlate the residual internal carotid diameter with NASCET-based stenosis levels, using the diameter of  the distal internal carotid lumen as the denominator for stenosis measurement. The following velocity measurements were obtained: RIGHT ICA:  Systolic 69 cm/sec, Diastolic 17 cm/sec CCA:  62 cm/sec SYSTOLIC ICA/CCA RATIO:  1.1 ECA:  70 cm/sec LEFT ICA:  Systolic 97 cm/sec, Diastolic 24 cm/sec CCA:  63 cm/sec SYSTOLIC ICA/CCA RATIO:  1.5 ECA:  64 cm/sec Right Brachial SBP: Not acquired Left Brachial SBP: Not acquired RIGHT CAROTID ARTERY: No significant calcifications of the right common carotid artery. Intermediate waveform maintained. Heterogeneous and partially calcified plaque at the right carotid bifurcation. No significant lumen shadowing. Low resistance waveform of the right ICA. Tortuosity RIGHT VERTEBRAL ARTERY: Antegrade flow with low resistance waveform. LEFT CAROTID ARTERY: No significant calcifications of the left common carotid artery. Intermediate waveform maintained. Heterogeneous and partially calcified plaque at the left carotid bifurcation without significant lumen shadowing. Low resistance waveform of the left ICA. Tortuosity LEFT VERTEBRAL ARTERY:  Antegrade flow with low resistance waveform. IMPRESSION: Color duplex indicates minimal heterogeneous and calcified plaque, with no hemodynamically significant stenosis by duplex criteria in the extracranial cerebrovascular circulation. Signed, Dulcy Fanny. Dellia Nims, RPVI [facility] Radiology Electronically Signed   By: Corrie Mckusick D.O.   On: 02/07/2020 11:58     Subjective: Patient has no new complaints today.  Her daughter was present in the room.  According to her she did felt little dizzy before passing out.  She has an history of intermittent dizziness since she had her hematoma in February 2021.  Denies any prior seizure-like activity.  Discharge Exam: Vitals:   02/07/20 1020 02/07/20 1022  BP: (!) 142/58 139/62  Pulse:    Resp:    Temp:    SpO2:     Vitals:   02/07/20 0800  02/07/20 1017 02/07/20 1020 02/07/20 1022  BP: 117/62 (!) 128/57 (!) 142/58 139/62  Pulse: (!) 52     Resp: 18     Temp:      TempSrc:      SpO2: 96%     Weight:      Height:        General: Pt is alert, awake, not in acute distress Cardiovascular: RRR, S1/S2 +, no rubs, no gallops Respiratory: CTA bilaterally, no wheezing, no rhonchi Abdominal: Soft, NT, ND, bowel sounds + Extremities: no edema, no cyanosis   The results of significant diagnostics from this hospitalization (including imaging, microbiology, ancillary and laboratory) are listed below for reference.    Microbiology: Recent Results (from the past 240 hour(s))  SARS Coronavirus 2 by RT PCR (hospital order, performed in [facility] hospital lab) Nasopharyngeal Nasopharyngeal Swab     Status: None   Collection Time: 02/06/20 11:14 PM   Specimen: Nasopharyngeal Swab  Result Value Ref Range Status   SARS Coronavirus 2 NEGATIVE NEGATIVE Final    Comment: (NOTE) SARS-CoV-2 target nucleic acids are NOT DETECTED.  The SARS-CoV-2 RNA is generally detectable in upper and lower respiratory specimens during the acute phase of infection. The lowest concentration of SARS-CoV-2 viral copies this assay can detect is 250 copies / mL. A negative result does not preclude SARS-CoV-2 infection and should not be used as the sole basis for treatment or other patient management decisions.  A negative result may occur with improper specimen collection / handling,  submission of specimen other than nasopharyngeal swab, presence of viral mutation(s) within the areas targeted by this assay, and inadequate number of viral copies (<250 copies / mL). A negative result must be combined with clinical observations, patient history, and epidemiological information.  Fact Sheet for Patients:   StrictlyIdeas.no  Fact Sheet for Healthcare Providers: BankingDealers.co.za  This test is not yet  approved or  cleared by the [place] FDA and has been authorized for detection and/or diagnosis of SARS-CoV-2 by FDA under an Emergency Use Authorization (EUA).  This EUA will remain in effect (meaning this test can be used) for the duration of the COVID-19 declaration under Section 564(b)(1) of the Act, 21 U.S.C. section 360bbb-3(b)(1), unless the authorization is terminated or revoked sooner.  Performed at [facility], [address] [place], [place] [address]      Labs: BNP (last 3 results) No results for input(s): BNP in the last 8760 hours. Basic Metabolic Panel: Recent Labs  Lab 02/06/20 1929  NA 139  K 3.7  CL 107  CO2 23  GLUCOSE 88  BUN 21  CREATININE 0.66  CALCIUM 8.9   Liver Function Tests: No results for input(s): AST, ALT, ALKPHOS, BILITOT, PROT, ALBUMIN in the last 168 hours. No results for input(s): LIPASE, AMYLASE in the last 168 hours. No results for input(s): AMMONIA in the last 168 hours. CBC: Recent Labs  Lab 02/06/20 1929  WBC 5.8  HGB 12.9  HCT 38.1  MCV 91.6  PLT 175   Cardiac Enzymes: No results for input(s): CKTOTAL, CKMB, CKMBINDEX, TROPONINI in the last 168 hours. BNP: Invalid input(s): POCBNP CBG: Recent Labs  Lab 02/06/20 1940  GLUCAP 76   D-Dimer No results for input(s): DDIMER in the last 72 hours. Hgb A1c No results for input(s): HGBA1C in the last 72 hours. Lipid Profile No results for input(s): CHOL, HDL, LDLCALC, TRIG, CHOLHDL, LDLDIRECT in the last 72 hours. Thyroid function studies Recent Labs    02/07/20 0549  TSH 0.688   Anemia work up No results for input(s): VITAMINB12, FOLATE, FERRITIN, TIBC, IRON, RETICCTPCT in the last 72 hours. Urinalysis    Component Value Date/Time   COLORURINE YELLOW (A) 02/06/2020 1929   APPEARANCEUR HAZY (A) 02/06/2020 1929   LABSPEC 1.020 02/06/2020 1929   PHURINE 5.0 02/06/2020 1929   GLUCOSEU NEGATIVE 02/06/2020 1929   HGBUR SMALL (A) 02/06/2020 [place] NEGATIVE 02/06/2020 [place] Negative 05/08/2019 [place] 02/06/2020 1929   PROTEINUR NEGATIVE 02/06/2020 1929   UROBILINOGEN 0.2 05/08/2019 1424   NITRITE NEGATIVE 02/06/2020 1929   LEUKOCYTESUR NEGATIVE 02/06/2020 1929   Sepsis Labs Invalid input(s): PROCALCITONIN,  WBC,  LACTICIDVEN Microbiology Recent Results (from the past 240 hour(s))  SARS Coronavirus 2 by RT PCR (hospital order, performed in [place] hospital lab) Nasopharyngeal Nasopharyngeal Swab     Status: None   Collection Time: 02/06/20 11:14 PM   Specimen: Nasopharyngeal Swab  Result Value Ref Range Status   SARS Coronavirus 2 NEGATIVE NEGATIVE Final    Comment: (NOTE) SARS-CoV-2 target nucleic acids are NOT DETECTED.  The SARS-CoV-2 RNA is generally detectable in upper and lower respiratory specimens during the acute phase of infection. The lowest concentration of SARS-CoV-2 viral copies this assay can detect is 250 copies / mL. A negative result does not preclude SARS-CoV-2 infection and should not be used as the sole basis for treatment or other patient management decisions.  A negative result may occur with  improper specimen collection / handling, submission of specimen other than nasopharyngeal swab, presence of viral mutation(s) within the areas targeted by this assay, and inadequate number of viral copies (<250 copies / mL). A negative result must be combined with clinical observations, patient history, and epidemiological information.  Fact Sheet for Patients:   StrictlyIdeas.no  Fact Sheet for Healthcare Providers: BankingDealers.co.za  This test is not yet approved or  cleared by the [place] FDA and has been authorized for detection and/or diagnosis of SARS-CoV-2 by FDA under an Emergency Use Authorization (EUA).  This EUA will remain in effect (meaning this test can be used) for the duration of the COVID-19  declaration under Section 564(b)(1) of the Act, 21 U.S.C. section 360bbb-3(b)(1), unless the authorization is terminated or revoked sooner.  Performed at [facility], [address], [place], [place] [address]     Time coordinating discharge: Over 30 minutes  SIGNED:  Lorella Nimrod, MD  [facility] 02/07/2020, 1:08 PM  If 7PM-7AM, please contact night-coverage [email or web address]  This record has been created using Systems analyst. Errors have been sought and corrected,but may not always be located. Such creation errors do not reflect on the standard of care.

## 2020-02-07 NOTE — Care Management Obs Status (Signed)
[facility] NOTIFICATION   Patient Details  Name: Elaine [name] MRN: [id number] Date of Birth: 13-Apr-1941   Medicare Observation Status Notification Given:  Yes    Anselm Pancoast, RN 02/07/2020, 1:38 PM

## 2020-02-18 ENCOUNTER — Other Ambulatory Visit: Payer: Self-pay | Admitting: Nurse Practitioner

## 2020-02-18 DIAGNOSIS — S065XAA Traumatic subdural hemorrhage with loss of consciousness status unknown, initial encounter: Secondary | ICD-10-CM

## 2020-02-20 ENCOUNTER — Telehealth: Payer: Self-pay

## 2020-02-20 NOTE — Telephone Encounter (Signed)
Copied from [place] [id number]. Topic: Appointment Scheduling - Scheduling Inquiry for Clinic >> Feb 20, 2020 11:55 AM Elaine [name] wrote: Patient would like to know who she should schedule her 4 month follow up appointment with in Dr. Sharmaine Base absence

## 2020-02-20 NOTE — Telephone Encounter (Signed)
Apt made with Adriana on 02/27/2020  Thanks,   -Mickel Baas

## 2020-02-26 NOTE — Progress Notes (Signed)
Established patient visit   Patient: Elaine [name]   DOB: 06-08-1941   79 y.o. Female  MRN: [id number] Visit Date: 02/27/2020  Today's healthcare provider: Trinna Post, PA-C   Chief Complaint  Patient presents with  . Memory Loss   Subjective    HPI  Follow up for memory loss  The patient was last seen for this 3 months ago. Changes made at last visit include no changes.  She reports good compliance with treatment. She feels that condition is Unchanged. Patient and mother noticed improvement with aricept and namenda. Unfortunately, patient suffered fall with TBI in 09/2019 which has created new but separate memory issues. They have been advised by their [facility] neurosurgeon that this may take a year to resolve, if it does. She has an upcoming head CT in [place] on 03/03/2020 that had been ordered by [facility] provider. She has since been released from routine follow up with [facility] neuro. She is not having side effects.   MMSE - Mini Mental State Exam 02/27/2020 10/03/2019 07/09/2019  Orientation to time 5 5 4   Orientation to Place 5 5 4   Registration 3 3 3   Attention/ Calculation 5 5 5   Recall 3 3 2   Language- name 2 objects 2 2 2   Language- repeat 1 1 1   Language- follow 3 step command 3 3 3   Language- read & follow direction 1 1 1   Write a sentence 1 1 1   Copy design 1 1 1   Total score 30 30 27    Hypertension, follow-up  BP Readings from Last 3 Encounters:  02/27/20 132/68  02/07/20 116/83  10/21/19 140/65   Wt Readings from Last 3 Encounters:  02/27/20 143 lb (64.9 kg)  02/06/20 140 lb (63.5 kg)  10/21/19 139 lb (63 kg)     She was last seen for hypertension 6 months ago.  BP at that visit was normal. Management since that visit includes continue enalapril 5 mg QD.  She reports excellent compliance with treatment. She is not having side effects.  She is following a Regular diet. She is not exercising. She does not smoke.  Use of agents associated with  hypertension: none.   Outside blood pressures are normal. Symptoms: No chest pain No chest pressure  No palpitations No syncope  No dyspnea No orthopnea  No paroxysmal nocturnal dyspnea No lower extremity edema   Pertinent labs: Lab Results  Component Value Date   CHOL 199 09/02/2019   HDL 35 (L) 09/02/2019   LDLCALC 150 (H) 09/02/2019   TRIG 79 09/02/2019   CHOLHDL 5.7 (H) 09/02/2019   Lab Results  Component Value Date   NA 139 02/06/2020   K 3.7 02/06/2020   CREATININE 0.66 02/06/2020   GFRNONAA >60 02/06/2020   GFRAA >60 02/06/2020   GLUCOSE 88 02/06/2020     The 10-year ASCVD risk score Mikey Bussing DC Jr., et al., 2013) is: 30.6%   ---------------------------------------------------------------------------------------------------       Medications: Outpatient Medications Prior to Visit  Medication Sig  . donepezil (ARICEPT) 10 MG tablet Take 1 tablet (10 mg total) by mouth at bedtime.  . enalapril (VASOTEC) 5 MG tablet TAKE 1 TABLET BY MOUTH  EVERY DAY (Patient taking differently: Take 5 mg by mouth daily. )  . memantine (NAMENDA) 5 MG tablet TAKE 1 TABLET BY MOUTH EVERY DAY  . sertraline (ZOLOFT) 50 MG tablet TAKE 1 TABLET BY MOUTH  DAILY (Patient taking differently: Take 50 mg by mouth daily. )  No facility-administered medications prior to visit.    Review of Systems  Constitutional: Negative.   Respiratory: Negative.   Cardiovascular: Negative.   Musculoskeletal: Negative.   Neurological: Negative.   Psychiatric/Behavioral: Negative.        Objective    BP 132/68   Temp 98.1 F (36.7 C)   Ht 5\' 3"  (1.6 m)   Wt 143 lb (64.9 kg)   BMI 25.33 kg/m     Physical Exam Constitutional:      Appearance: Normal appearance.  Cardiovascular:     Rate and Rhythm: Normal rate and regular rhythm.     Pulses: Normal pulses.     Heart sounds: Murmur heard.   Pulmonary:     Effort: Pulmonary effort is normal.     Breath sounds: Normal breath sounds.   Skin:    General: Skin is warm and dry.  Neurological:     Mental Status: She is alert and oriented to person, place, and time. Mental status is at baseline.  Psychiatric:        Mood and Affect: Mood normal.        Behavior: Behavior normal.      No results found for any visits on 02/27/20.  Assessment & Plan     1. Mild cognitive impairment with memory loss  Had improved but patient and daughter feel some new issues have arisen since TBI. Overall however they are happy with progress from medications and hopeful for neurologic recovery from incident. Can continue namenda and aricept.    2. Essential hypertension  Continue medications   Return in about 6 months (around 08/29/2020) for chronic .      ITrinna Post, PA-C, have reviewed all documentation for this visit. The documentation on 03/03/20 for the exam, diagnosis, procedures, and orders are all accurate and complete.  I have spent 25 minutes with this patient, >50% of which was spent on counseling and coordination of care.     Paulene Floor  [facility] [phone] (phone) [phone] (fax)  [place]

## 2020-02-27 ENCOUNTER — Ambulatory Visit (INDEPENDENT_AMBULATORY_CARE_PROVIDER_SITE_OTHER): Payer: Medicare Other | Admitting: Physician Assistant

## 2020-02-27 ENCOUNTER — Other Ambulatory Visit: Payer: Self-pay

## 2020-02-27 ENCOUNTER — Encounter: Payer: Self-pay | Admitting: Physician Assistant

## 2020-02-27 VITALS — BP 132/68 | Temp 98.1°F | Ht 63.0 in | Wt 143.0 lb

## 2020-02-27 DIAGNOSIS — I10 Essential (primary) hypertension: Secondary | ICD-10-CM

## 2020-02-27 DIAGNOSIS — G3184 Mild cognitive impairment, so stated: Secondary | ICD-10-CM

## 2020-03-02 ENCOUNTER — Ambulatory Visit: Payer: Self-pay | Admitting: Family Medicine

## 2020-03-03 ENCOUNTER — Other Ambulatory Visit: Payer: Self-pay

## 2020-03-03 ENCOUNTER — Ambulatory Visit
Admission: RE | Admit: 2020-03-03 | Discharge: 2020-03-03 | Disposition: A | Discharge status: Home / Self Care | Payer: Medicare Other | Source: Ambulatory Visit | Attending: Nurse Practitioner | Admitting: Nurse Practitioner

## 2020-03-03 DIAGNOSIS — I6201 Nontraumatic acute subdural hemorrhage: Secondary | ICD-10-CM | POA: Diagnosis not present

## 2020-03-03 DIAGNOSIS — S065XAA Traumatic subdural hemorrhage with loss of consciousness status unknown, initial encounter: Secondary | ICD-10-CM

## 2020-03-03 DIAGNOSIS — I672 Cerebral atherosclerosis: Secondary | ICD-10-CM | POA: Diagnosis not present

## 2020-03-03 DIAGNOSIS — S065X9A Traumatic subdural hemorrhage with loss of consciousness of unspecified duration, initial encounter: Secondary | ICD-10-CM | POA: Diagnosis not present

## 2020-03-03 DIAGNOSIS — S065X0A Traumatic subdural hemorrhage without loss of consciousness, initial encounter: Secondary | ICD-10-CM | POA: Diagnosis not present

## 2020-03-03 DIAGNOSIS — I6203 Nontraumatic chronic subdural hemorrhage: Secondary | ICD-10-CM | POA: Diagnosis not present

## 2020-03-10 ENCOUNTER — Telehealth: Payer: Self-pay | Admitting: Family Medicine

## 2020-03-10 NOTE — Telephone Encounter (Signed)
*  meant to say "will warrant an OV (office visit)".

## 2020-03-10 NOTE — Progress Notes (Signed)
Established patient visit   Patient: Elaine [name]   DOB: 1941/04/05   79 y.o. Female  MRN: [id number] Visit Date: 03/11/2020  Today's healthcare provider: Trinna Post, PA-C   Chief Complaint  Patient presents with  . Loss of Consciousness  I,Virgie Chery M Satoru Milich,acting as a scribe for Trinna Post, PA-C.,have documented all relevant documentation on the behalf of Trinna Post, PA-C,as directed by  Trinna Post, PA-C while in the presence of Trinna Post, PA-C.  Subjective    Loss of Consciousness This is a recurrent problem. The current episode started in the past 7 days. The problem occurs rarely. The problem has been unchanged. She lost consciousness for a period of less than 1 minute. The symptoms are aggravated by sitting up and inactivity (sitting on a bar stool and turned to get off and fainted). Associated symptoms include dizziness. Pertinent negatives include no clumsiness, focal sensory loss, palpitations, slurred speech, vertigo or visual change. She has tried nothing for the symptoms. The treatment provided no relief.    Patient is currently on Aricept 10 mg daily and Namenda 5 mg daily. She was started on this in the past year for memory loss. Patient suffered a fall in February of this year that led to subdural hematoma and hospitalization. The fall was unwitnessed. Patient had another episode where she fell in the bathroom, also unwitnessed. She was taken to the hospital for this in 02/06/2020 and it was thought to be due to her recent head injury. However, several days ago daughter reports the patient had a witnessed syncope. She was sitting on a stool and fell out. She was caught by her son in law and so did not hit her head. Daughter reports it took a minute for the patient to come around. No jerking motions. CT Head from 03/03/2020 shows resolving hematoma. Daughter wonders if it is possibly Aricept causing syncope. When patient was hospitalized for her  initial fall in February, she developed atrial fibrillation in the hospital. The cardiologist who saw her thought this was brought on by her hospitalization rather than the arrhythmia causing her fall.      Medications: Outpatient Medications Prior to Visit  Medication Sig  . donepezil (ARICEPT) 10 MG tablet Take 1 tablet (10 mg total) by mouth at bedtime.  . enalapril (VASOTEC) 5 MG tablet TAKE 1 TABLET BY MOUTH  EVERY DAY (Patient taking differently: Take 5 mg by mouth daily. )  . memantine (NAMENDA) 5 MG tablet TAKE 1 TABLET BY MOUTH EVERY DAY  . sertraline (ZOLOFT) 50 MG tablet TAKE 1 TABLET BY MOUTH  DAILY (Patient taking differently: Take 50 mg by mouth daily. )   No facility-administered medications prior to visit.    Review of Systems  Constitutional: Negative.   Respiratory: Negative.   Cardiovascular: Positive for syncope. Negative for palpitations.  Neurological: Positive for dizziness. Negative for vertigo.  Psychiatric/Behavioral: Negative.       Objective    BP (!) 152/98 (BP Location: Right Arm, Patient Position: Sitting, Cuff Size: Normal)   Pulse (!) 59   Temp (!) 96.8 F (36 C) (Temporal)   Wt 144 lb 9.6 oz (65.6 kg)   SpO2 98%   BMI 25.61 kg/m    Physical Exam Constitutional:      Appearance: Normal appearance.  Cardiovascular:     Rate and Rhythm: Normal rate.  Pulmonary:     Effort: Pulmonary effort is normal.  Skin:  General: Skin is warm and dry.  Neurological:     General: No focal deficit present.     Mental Status: She is alert and oriented to person, place, and time.  Psychiatric:        Mood and Affect: Mood normal.        Behavior: Behavior normal.       No results found for any visits on 03/11/20.  Assessment & Plan    1. Mild cognitive impairment with memory loss  Aricept comes with a 2% risk of syncope as an adverse effect. Additional adverse effects listed are dizziness. Namenda also has a side effect of dizziness.  Discussed extensively the various options. One would be to discontinue Aricept and observe. The patient has already unfortunately sustained a fall with a serious head injury and her safety must be considered. We also discussed that if we remove Aricept, patient may experience a worsening decline of her cognitive impairment. We must also consider any potential role of arrhythmia, including a-fib, in causing syncope. The patient herself wishes to stay on Aricept to have maximal benefit against memory loss. Her daughter remains concerned about her safety in doing so.   No official decision was made at the end of this visit. The family would like to think about their options. I have advised they can discontinue Aricept when they are ready. Also offered cardiology referral but they deferred at this time. They will think about their options. They do also have an upcoming neurosurgery appointment and they can also gather their specialist's opinion as well at that time.   2. Syncope, unspecified syncope type     Return if symptoms worsen or fail to improve.      ITrinna Post, PA-C, have reviewed all documentation for this visit. The documentation on 03/12/20 for the exam, diagnosis, procedures, and orders are all accurate and complete.    Paulene Floor  [facility] [phone] (phone) [phone] (fax)  [place]

## 2020-03-10 NOTE — Telephone Encounter (Signed)
Sharee Pimple called to request that the nurse or doctor call her regarding her mom's medication.  She believes that her mom is having a reaction to the medication which is causing her to have episodes.  Please call her to discuss before her mom's appt. On Wednesday.  CB# [phone]

## 2020-03-10 NOTE — Telephone Encounter (Signed)
Spoke with the pts daughter Sharee Pimple) and she reports that the patient passed out again yesterday. She reports that this is the 3rd syncopal episode she has had in 6 months. She feels that this could be related to her medication. She reports that she was started on Aricept and Namenda in 09/2019, and that's when her episodes started happening. She read online that Aricept can cause fainting spells.   She has scheduled and appt with you to discuss changing her medication to a combination tablet called Namzaric. However, she is wanting to know if you can just start her on this medication since she was just seen in the office a few weeks ago? I advised her that new medication changes most likely will warrant an O, but she wanted me to still ask. Please advise. Thanks!

## 2020-03-11 ENCOUNTER — Encounter: Payer: Self-pay | Admitting: Physician Assistant

## 2020-03-11 ENCOUNTER — Other Ambulatory Visit: Payer: Self-pay

## 2020-03-11 ENCOUNTER — Ambulatory Visit (INDEPENDENT_AMBULATORY_CARE_PROVIDER_SITE_OTHER): Payer: Medicare Other | Admitting: Physician Assistant

## 2020-03-11 VITALS — BP 152/98 | HR 59 | Temp 96.8°F | Wt 144.6 lb

## 2020-03-11 DIAGNOSIS — R55 Syncope and collapse: Secondary | ICD-10-CM | POA: Diagnosis not present

## 2020-03-11 DIAGNOSIS — G3184 Mild cognitive impairment, so stated: Secondary | ICD-10-CM | POA: Diagnosis not present

## 2020-03-11 NOTE — Telephone Encounter (Signed)
Pt coming in the office today to discuss.

## 2020-03-31 ENCOUNTER — Other Ambulatory Visit: Payer: Self-pay

## 2020-03-31 ENCOUNTER — Encounter: Payer: Self-pay | Admitting: Dermatology

## 2020-03-31 ENCOUNTER — Ambulatory Visit: Payer: Medicare Other | Admitting: Dermatology

## 2020-03-31 DIAGNOSIS — L821 Other seborrheic keratosis: Secondary | ICD-10-CM | POA: Diagnosis not present

## 2020-03-31 DIAGNOSIS — B078 Other viral warts: Secondary | ICD-10-CM | POA: Diagnosis not present

## 2020-03-31 DIAGNOSIS — L578 Other skin changes due to chronic exposure to nonionizing radiation: Secondary | ICD-10-CM | POA: Diagnosis not present

## 2020-03-31 DIAGNOSIS — D1800 Hemangioma unspecified site: Secondary | ICD-10-CM

## 2020-03-31 DIAGNOSIS — L570 Actinic keratosis: Secondary | ICD-10-CM | POA: Diagnosis not present

## 2020-03-31 NOTE — Progress Notes (Signed)
   New Patient Visit  Subjective  Elaine [name] is a 79 y.o. female who presents for the following: Skin Problem (check rough patch on her R hairline scalp, area been there for several years but now getting larger  ). Check skin for skin cancers, no known history of skin cancer.   Husband with pt   The following portions of the chart were reviewed this encounter and updated as appropriate:  Tobacco  Allergies  Meds  Problems  Med Hx  Surg Hx  Fam Hx     Review of Systems:  No other skin or systemic complaints except as noted in HPI or Assessment and Plan.  Objective  Well appearing patient in no apparent distress; mood and affect are within normal limits.  An examination was performed including face, scalp, ears, lips, eyelids, arms, hands, fingernails, legs, and feet. Relevant physical exam findings are noted in the Assessment and Plan.  Objective  R hairline scalp (4): Verrucous waxy stuck on plaques x 4  Objective  Right dorsal hand, R forearm (2): Erythematous thin papules/macules with gritty scale.    Assessment & Plan  Other viral warts (4) R hairline scalp  Wart within a seborrheic keratosis   Benign.   Discussed viral etiology and risk of spread.  Discussed multiple treatments may be required to clear warts.  Discussed possible post-treatment dyspigmentation and risk of recurrence.     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 - Observe - Call for any changes  Actinic Damage - diffuse scaly erythematous macules with underlying dyspigmentation - Recommend daily broad spectrum sunscreen SPF 30+ to sun-exposed areas, reapply every 2 hours as needed.  - Call for new or changing lesions.  Lentigines - Scattered tan macules - Discussed due to sun exposure - Benign, observe - Call for any changes  Hemangiomas - Red papules - Discussed benign nature - Observe - Call for any changes  Return in about 2 months (around 05/31/2020) for TBSE, AKs .  I, Marye Round, CMA, am acting as scribe for Forest Gleason, MD .  Documentation: I have reviewed the above documentation for accuracy and completeness, and I agree with the above.  Forest Gleason, MD

## 2020-03-31 NOTE — Patient Instructions (Addendum)
Melanoma ABCDEs  Melanoma is the most dangerous type of skin cancer, and is the leading cause of death from skin disease.  You are more likely to develop melanoma if you:  Have light-colored skin, light-colored eyes, or red or blond hair  Spend a lot of time in the sun  Tan regularly, either outdoors or in a tanning bed  Have had blistering sunburns, especially during childhood  Have a close family member who has had a melanoma  Have atypical moles or large birthmarks  Early detection of melanoma is key since treatment is typically straightforward and cure rates are extremely high if we catch it early.   The first sign of melanoma is often a change in a mole or a new dark spot.  The ABCDE system is a way of remembering the signs of melanoma.  A for asymmetry:  The two halves do not match. B for border:  The edges of the growth are irregular. C for color:  A mixture of colors are present instead of an even brown color. D for diameter:  Melanomas are usually (but not always) greater than 34mm - the size of a pencil eraser. E for evolution:  The spot keeps changing in size, shape, and color.  Please check your skin once per month between visits. You can use a small mirror in front and a large mirror behind you to keep an eye on the back side or your body.   If you see any new or changing lesions before your next follow-up, please call to schedule a visit.  Please continue daily skin protection including broad spectrum sunscreen SPF 30+ to sun-exposed areas, reapplying every 2 hours as needed when you're outdoors.   Seborrheic Keratosis  What causes seborrheic keratoses? Seborrheic keratoses are harmless, common skin growths that first appear during adult life.  As time goes by, more growths appear.  Some people may develop a large number of them.  Seborrheic keratoses appear on both covered and uncovered body parts.  They are not caused by sunlight.  The tendency to develop seborrheic  keratoses can be inherited.  They vary in color from skin-colored to gray, brown, or even black.  They can be either smooth or have a rough, warty surface.   Seborrheic keratoses are superficial and look as if they were stuck on the skin.  Under the microscope this type of keratosis looks like layers upon layers of skin.  That is why at times the top layer may seem to fall off, but the rest of the growth remains and re-grows.    Treatment Seborrheic keratoses do not need to be treated, but can easily be removed in the office.  Seborrheic keratoses often cause symptoms when they rub on clothing or jewelry.  Lesions can be in the way of shaving.  If they become inflamed, they can cause itching, soreness, or burning.  Removal of a seborrheic keratosis can be accomplished by freezing, burning, or surgery. If any spot bleeds, scabs, or grows rapidly, please return to have it checked, as these can be an indication of a skin cancer.  Viral Warts   Viral warts and molluscum contagiosum are growths of the skin caused by viral infection of the skin. If you have been given the diagnosis of viral warts or molluscum contagiosum there are a few things that you must understand about your condition:  1. There is no guaranteed treatment method available for this condition. 2. Multiple treatments may be required, 3. The  treatments may be time consuming and require multiple visits to the dermatology office. 4. The treatment may be expensive. You will be charged each time you come into the office to have the spots treated. 5. The treated areas may develop new lesions further complicating treatment. 6. The treated areas may leave a scar. 7. There is no guarantee that even after multiple treatments that the spots will be successfully treated. 8. These are caused by a viral infection and can be spread to other areas of the skin and to other people by direct contact. Therefore, new spots may occur. Cryotherapy  Aftercare  Wash gently with soap and water everyday.   Apply Vaseline and Band-Aid daily until healed.

## 2020-05-11 ENCOUNTER — Ambulatory Visit (INDEPENDENT_AMBULATORY_CARE_PROVIDER_SITE_OTHER): Payer: Medicare Other | Admitting: Family Medicine

## 2020-05-11 ENCOUNTER — Encounter: Payer: Self-pay | Admitting: Family Medicine

## 2020-05-11 ENCOUNTER — Other Ambulatory Visit: Payer: Self-pay

## 2020-05-11 ENCOUNTER — Other Ambulatory Visit: Payer: Self-pay | Admitting: Family Medicine

## 2020-05-11 VITALS — BP 147/57 | HR 56 | Temp 98.3°F | Wt 147.0 lb

## 2020-05-11 DIAGNOSIS — G3184 Mild cognitive impairment, so stated: Secondary | ICD-10-CM | POA: Diagnosis not present

## 2020-05-11 DIAGNOSIS — Z23 Encounter for immunization: Secondary | ICD-10-CM

## 2020-05-11 DIAGNOSIS — R55 Syncope and collapse: Secondary | ICD-10-CM | POA: Diagnosis not present

## 2020-05-11 DIAGNOSIS — R42 Dizziness and giddiness: Secondary | ICD-10-CM | POA: Diagnosis not present

## 2020-05-11 DIAGNOSIS — F339 Major depressive disorder, recurrent, unspecified: Secondary | ICD-10-CM

## 2020-05-11 NOTE — Assessment & Plan Note (Addendum)
Seems to have stabilized Continue aricept 5mg  daily and Namenda 5mg  daily Tolerating these well, some diarrhea - continue imodium prn - if diarrhea worsens, consider discontinuing Aricept, but patient prefers to continue at this time With dizzy spells/recurrent syncope, may consider stopping one medication in the future

## 2020-05-11 NOTE — Assessment & Plan Note (Addendum)
1 possible episode in February, head injury 2 episodes of syncope noted with patient in seated position with no prodrome, position change, fright/stressor History of a fib in hospital, so concern for arrhythmia, especially given intermittent palpitations and how the syncope occured Updated cardiologist about episodes - awaiting call back Patient likely needs 30 d event monitor Consider stopping aricept or namenda if additional episodes, increasing dizziness

## 2020-05-11 NOTE — Progress Notes (Signed)
Established patient visit   Patient: Elaine [name]   DOB: [birth year]   79 y.o. Female  MRN: [id number] Visit Date: 05/11/2020  Today's healthcare provider: Lavon Paganini, MD   Chief Complaint  Patient presents with  . Loss of Consciousness   Subjective    HPI   Kieth Brightly is her for follow up of cognitive impairment after syncopal episodes that led to ED visits. In February she had a fall and subsequent subdural hematoma. Since that time she has has 2 10-12 second episodes of passing out while she is in a sitting position. On one of these her partner noticed her left arm was shaking and she was not responding to him calling her name. After these she immediately returns to baseline with no residual confusion or tiredness.  She also reports feeling dizzy when she sits up in bed or in a chair, and that this is happening with decreasing frequency.   Her most recent cardiology appointment was in April, and her next appointment will be in 8 months.  She has also been sleepier than usual. This does not occur every day but has been more frequent over the last month. During these days she will fall asleep on the sofa. She is still able to sleep well at night. She also seems to get tired more easily with activity.  Patient Active Problem List   Diagnosis Date Noted  . Subdural hematoma ([place]) 02/07/2020  . 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 Mild cognitive impairment with memory loss 07/10/2019  . Hypercholesteremia 01/22/2016  . Insomnia 09/21/2015  . 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 Arthritis, degenerative 05/28/2015  . 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 Restless leg 05/28/2015  . Rotator cuff syndrome 07/03/2013  . 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 Acid reflux 06/08/2007  . Hypertension 07/26/2005   Past Medical History:  Diagnosis Date  . Alzheimer disease ([place])   . Depression   .  Hypertension       Medications: Outpatient Medications Prior to Visit  Medication Sig  . donepezil (ARICEPT) 10 MG tablet Take 1 tablet (10 mg total) by mouth at bedtime.  . enalapril (VASOTEC) 5 MG tablet TAKE 1 TABLET BY MOUTH  EVERY DAY (Patient taking differently: Take 5 mg by mouth daily. )  . memantine (NAMENDA) 5 MG tablet TAKE 1 TABLET BY MOUTH EVERY DAY  . sertraline (ZOLOFT) 50 MG tablet TAKE 1 TABLET BY MOUTH  DAILY (Patient taking differently: Take 50 mg by mouth daily. )   No facility-administered medications prior to visit.    Review of Systems  Constitutional: Positive for fatigue.  Gastrointestinal: Positive for diarrhea.  Neurological: Positive for dizziness, syncope and light-headedness.  Psychiatric/Behavioral: Negative for dysphoric mood and sleep disturbance. The patient is not nervous/anxious.       Objective    BP (!) 147/57 (BP Location: Right Arm, Patient Position: Sitting, Cuff Size: Large)   Pulse (!) 56   Temp 98.3 F (36.8 C) (Oral)   Wt 147 lb (66.7 kg)   SpO2 99%   BMI 26.04 kg/m  BP Readings from Last 3 Encounters:  05/11/20 (!) 147/57  03/11/20 (!) 152/98  02/27/20 132/68   Wt Readings from Last 3 Encounters:  05/11/20 147 lb (66.7 kg)  03/11/20 144 lb 9.6 oz (65.6 kg)  02/27/20 143 lb (64.9 kg)   Orthostatic Vitals for the past 48 hrs (Last 6 readings):  Patient Position  Orthostatic BP Orthostatic Pulse  05/11/20 1436 Sitting -- --  05/11/20 1549 Supine 146/76 51  05/11/20 1551 Sitting 139/76 53  05/11/20 1553 Standing 146/78 60    Physical Exam Constitutional:      General: She is not in acute distress.    Appearance: Normal appearance. She is not ill-appearing, toxic-appearing or diaphoretic.  HENT:     Head: Normocephalic.     Right Ear: External ear normal.     Left Ear: External ear normal.  Eyes:     Extraocular Movements: Extraocular movements intact.     Right eye: Nystagmus present.     Left eye: Nystagmus present.      Conjunctiva/sclera:     Right eye: Right conjunctiva is not injected. No exudate.    Left eye: Left conjunctiva is not injected. No exudate. Cardiovascular:     Rate and Rhythm: Normal rate and regular rhythm.     Pulses: Normal pulses.     Heart sounds: Murmur heard.   Pulmonary:     Effort: Pulmonary effort is normal. No respiratory distress.     Breath sounds: Normal breath sounds. No stridor. No wheezing or rales.  Abdominal:     General: Abdomen is flat.     Palpations: Abdomen is soft.     Tenderness: There is no abdominal tenderness. There is no guarding.  Musculoskeletal:        General: Normal range of motion.     Cervical back: Normal range of motion and neck supple. No rigidity or tenderness.  Lymphadenopathy:     Cervical: No cervical adenopathy.  Skin:    General: Skin is warm.  Neurological:     Mental Status: She is alert.  Psychiatric:        Attention and Perception: Attention and perception normal.        Mood and Affect: Mood normal.        Speech: Speech normal.        Behavior: Behavior normal. Behavior is cooperative.        Thought Content: Thought content normal.        Cognition and Memory: Memory is impaired.        Judgment: Judgment normal.    No results found for any visits on 05/11/20.  Assessment & Plan     Problem List Items Addressed This Visit      Nervous and Auditory   Mild cognitive impairment with memory loss - Primary    Seems to have stabilized Continue aricept 5mg  daily and Namenda 5mg  daily Tolerating these well, some diarrhea With dizzy spells/recurrent syncope, may consider stopping one medication in the future        Other   Recurrent syncope    1 possible episode in February, head injury 2 episodes of syncope noted with patient in seated position History of a fib in hospital, so concern for arrhythmia Updated cardiologist about episodes Consider stopping aricept or namenda if additional episodes, increasing  dizziness       Other Visit Diagnoses    Dizziness       Need for influenza vaccination       Relevant Orders   Flu Vaccine QUAD High Dose(Fluad) (Completed)       Return in about 4 months (around 09/03/2020) for  CPE (scheduled).   Total time spent on today's visit was greater than 30 minutes, including both face-to-face time and nonface-to-face time personally spent on review of chart (labs and imaging), discussing labs and  goals, discussing further work-up, treatment options, referrals to specialist if needed, reviewing outside records of pertinent, answering patient's questions, and coordinating care.       Rodrigo Ran, MS3   Patient seen along with MS3 student Rodrigo Ran. I personally evaluated this patient along with the student, and verified all aspects of the history, physical exam, and medical decision making as documented by the student. I agree with the student's documentation and have made all necessary edits.  Darinda [place], Dionne Bucy, MD, MPH [facility]

## 2020-05-11 NOTE — Telephone Encounter (Signed)
Requested Prescriptions  Pending Prescriptions Disp Refills  . sertraline (ZOLOFT) 50 MG tablet [Pharmacy Med Name: SERTRALINE HCL 50MG  TABLET] 90 tablet 1    Sig: TAKE 1 TABLET BY MOUTH  DAILY     Psychiatry:  Antidepressants - SSRI Passed - 05/11/2020  7:46 PM      Passed - Completed PHQ-2 or PHQ-9 in the last 360 days.      Passed - Valid encounter within last 6 months    Recent Outpatient Visits          Today Mild cognitive impairment with memory loss   [facility] [place], Dionne Bucy, MD   2 months ago Mild cognitive impairment with memory loss   [place], [place]   2 months ago Mild cognitive impairment with memory loss   [place], PA-C   6 months ago Subdural hematoma [facility])   [facility], Dionne Bucy, MD   7 months ago Essential hypertension   [place], Dionne Bucy, MD      Future Appointments            In 3 weeks [facility], [place], MD [place]   In 3 months [place], Dionne Bucy, MD [facility], [place]

## 2020-05-12 ENCOUNTER — Other Ambulatory Visit: Payer: Self-pay | Admitting: Family Medicine

## 2020-05-12 MED ORDER — ENALAPRIL MALEATE 5 MG PO TABS: 5.0000 mg | ORAL_TABLET | Freq: Every day | ORAL | 1 refills | Status: DC

## 2020-05-12 NOTE — Telephone Encounter (Signed)
[facility] faxed refill request for the following medications:  enalapril (VASOTEC) 5 MG tablet  Please advise.

## 2020-05-25 ENCOUNTER — Other Ambulatory Visit: Payer: Self-pay | Admitting: Family Medicine

## 2020-05-25 DIAGNOSIS — R55 Syncope and collapse: Secondary | ICD-10-CM | POA: Diagnosis not present

## 2020-05-25 NOTE — Telephone Encounter (Signed)
Requested Prescriptions  Pending Prescriptions Disp Refills   memantine (NAMENDA) 5 MG tablet [Pharmacy Med Name: MEMANTINE HCL 5 MG TABLET] 90 tablet 1    Sig: TAKE 1 TABLET BY MOUTH EVERY DAY     Neurology:  Alzheimer's Agents Passed - 05/25/2020  9:31 AM      Passed - Valid encounter within last 6 months    Recent Outpatient Visits          2 weeks ago Mild cognitive impairment with memory loss   [facility], Dionne Bucy, MD   2 months ago Mild cognitive impairment with memory loss   [place], PA-C   2 months ago Mild cognitive impairment with memory loss   [place], PA-C   7 months ago Subdural hematoma [facility])   [facility], Dionne Bucy, MD   7 months ago Essential hypertension   [place], Dionne Bucy, MD      Future Appointments            In 1 week [facility] [place], [place], MD [place]   In 3 months [place], Dionne Bucy, MD [facility], [place]

## 2020-05-26 DIAGNOSIS — R55 Syncope and collapse: Secondary | ICD-10-CM | POA: Diagnosis not present

## 2020-06-03 ENCOUNTER — Other Ambulatory Visit: Payer: Self-pay

## 2020-06-03 ENCOUNTER — Ambulatory Visit: Payer: Medicare Other | Admitting: Dermatology

## 2020-06-03 ENCOUNTER — Encounter: Payer: Self-pay | Admitting: Dermatology

## 2020-06-03 DIAGNOSIS — Z872 Personal history of diseases of the skin and subcutaneous tissue: Secondary | ICD-10-CM | POA: Diagnosis not present

## 2020-06-03 DIAGNOSIS — L820 Inflamed seborrheic keratosis: Secondary | ICD-10-CM | POA: Diagnosis not present

## 2020-06-03 DIAGNOSIS — L578 Other skin changes due to chronic exposure to nonionizing radiation: Secondary | ICD-10-CM

## 2020-06-03 NOTE — Patient Instructions (Addendum)
Cryotherapy Aftercare  . Wash gently with soap and water everyday.   . Apply Vaseline and Band-Aid daily until healed.  Prior to procedure, discussed risks of blister formation, small wound, skin dyspigmentation, or rare scar following cryotherapy.   Melanoma ABCDEs  Melanoma is the most dangerous type of skin cancer, and is the leading cause of death from skin disease.  You are more likely to develop melanoma if you:  Have light-colored skin, light-colored eyes, or red or blond hair  Spend a lot of time in the sun  Tan regularly, either outdoors or in a tanning bed  Have had blistering sunburns, especially during childhood  Have a close family member who has had a melanoma  Have atypical moles or large birthmarks  Early detection of melanoma is key since treatment is typically straightforward and cure rates are extremely high if we catch it early.   The first sign of melanoma is often a change in a mole or a new dark spot.  The ABCDE system is a way of remembering the signs of melanoma.  A for asymmetry:  The two halves do not match. B for border:  The edges of the growth are irregular. C for color:  A mixture of colors are present instead of an even brown color. D for diameter:  Melanomas are usually (but not always) greater than 6mm - the size of a pencil eraser. E for evolution:  The spot keeps changing in size, shape, and color.  Please check your skin once per month between visits. You can use a small mirror in front and a large mirror behind you to keep an eye on the back side or your body.   If you see any new or changing lesions before your next follow-up, please call to schedule a visit.  Please continue daily skin protection including broad spectrum sunscreen SPF 30+ to sun-exposed areas, reapplying every 2 hours as needed when you're outdoors.   

## 2020-06-03 NOTE — Progress Notes (Addendum)
   Follow-Up Visit   Subjective  Elaine [name] is a 79 y.o. female who presents for the following: Follow-up (Patient here today for 2 month AK and wart follow up. ).  Patient had AK's treated at right forearm and right hand with LN2 at last visit. Also had 4 warts treated at right hairline with LN2. AK's resolved but spots at right hairline are the same.  Patient defers TBSE.   The following portions of the chart were reviewed this encounter and updated as appropriate:  Tobacco  Allergies  Meds  Problems  Med Hx  Surg Hx  Fam Hx      Review of Systems:  No other skin or systemic complaints except as noted in HPI or Assessment and Plan.  Objective  Well appearing patient in no apparent distress; mood and affect are within normal limits.  A focused examination was performed including face, arms, hands, scalp. Relevant physical exam findings are noted in the Assessment and Plan.  Objective  Right hairline scalp: Erythematous keratotic or waxy stuck-on plaque.   Objective  Right Forearm, right dorsal hand: Clear    Assessment & Plan  Inflamed seborrheic keratosis Right hairline scalp  With wart   Patient advised will take more than 1 more freeze. Discussed viral etiology and risk of spread.  Discussed multiple treatments may be required to clear warts.  Discussed possible post-treatment dyspigmentation and risk of recurrence.  Prior to procedure, discussed risks of blister formation, small wound, skin dyspigmentation, or rare scar following cryotherapy.   Patient defers treatment of smaller spots.  Destruction of lesion - Right hairline scalp Complexity: simple   Destruction method: cryotherapy   Informed consent: discussed and consent obtained   Lesion destroyed using liquid nitrogen: Yes   Cryotherapy cycles:  2 Outcome: patient tolerated procedure well with no complications   Post-procedure details: wound care instructions given    History of actinic  keratoses Right Forearm, right dorsal hand  No evidence of recurrence, call clinic for new or changing lesions.   Actinic Damage - diffuse scaly erythematous macules with underlying dyspigmentation - Recommend daily broad spectrum sunscreen SPF 30+ to sun-exposed areas, reapply every 2 hours as needed.  - Patient declines FBSE today. - Call for new or changing lesions.  Return in about 4 weeks (around 07/01/2020) for wart/ISK.  Graciella Belton, RMA, am acting as scribe for Forest Gleason, MD .  Documentation: I have reviewed the above documentation for accuracy and completeness, and I agree with the above.  Forest Gleason, MD

## 2020-07-07 ENCOUNTER — Telehealth: Payer: Self-pay | Admitting: Family Medicine

## 2020-07-07 NOTE — Telephone Encounter (Signed)
Patient's daughter, Katheren Puller dropped off paperwork for FMLA to be completed for her. Sharee Pimple is asking paperwork to be completed and faxed to [phone]. Call Sharee Pimple to let her know this is done at [phone].  Paperwork was placed in Dr. B.'s box.  Thanks, [employer]

## 2020-07-08 ENCOUNTER — Ambulatory Visit: Payer: Medicare Other | Admitting: Dermatology

## 2020-07-09 NOTE — Telephone Encounter (Signed)
Paperwork completed. Will give to Madaline Guthrie for processing

## 2020-07-13 NOTE — Telephone Encounter (Signed)
If more info is needed call Sharee Pimple

## 2020-07-13 NOTE — Telephone Encounter (Signed)
Forms have been processed and are in pt's chart. TNP

## 2020-07-13 NOTE — Telephone Encounter (Signed)
Pts daughter called and stated that the way the forms were completed were in correct and it needs to be revised and resubmitted / she needs it to say Due to Pts inability to drive and care for self  pts daughter can take off work at short notice and as needed to care for Elaine [name] (daughter needs to have the flexibility with work to care for her mom)/ Pt needs to be able to take off work without being penalized/ please advised

## 2020-07-14 NOTE — Telephone Encounter (Signed)
Please review. Thanks!  

## 2020-07-20 NOTE — Telephone Encounter (Signed)
Please advise 

## 2020-07-22 NOTE — Telephone Encounter (Signed)
It did already discuss transportation and ADLs, but I put more about that and self care again.  Will give to [facility] to resubmit today. Please let daughter know when it is submitted also.

## 2020-07-23 ENCOUNTER — Telehealth: Payer: Self-pay

## 2020-07-23 NOTE — Telephone Encounter (Signed)
FMLA was denied, missing  pages  / please call Sharee Pimple at  [phone]

## 2020-07-23 NOTE — Telephone Encounter (Signed)
Copied from [place] [id number]. Topic: General - Other >> Jul 23, 2020  3:25 PM Yvette Rack wrote: Reason for CRM: Pt daughter Sharee Pimple stated it is very important that someone call her back today regarding the FMLA paperwork. Cb# [phone]

## 2020-07-24 NOTE — Telephone Encounter (Signed)
Elaine [name] is requesting the FMLA forms be completed so she can take an entire 12 hour shift off if her mother needed her. I have contacted Bebe Liter and requested new forms to be faxed to our office. Elaine [name] was advised and stated that she will also try to upload an original copy of the forms via mychart. TNP

## 2020-07-26 ENCOUNTER — Other Ambulatory Visit: Payer: Self-pay | Admitting: Family Medicine

## 2020-07-26 NOTE — Telephone Encounter (Signed)
Requested Prescriptions  Pending Prescriptions Disp Refills  . donepezil (ARICEPT) 10 MG tablet [Pharmacy Med Name: DONEPEZIL HCL 10 MG TABLET] 90 tablet 1    Sig: TAKE 1 TABLET BY MOUTH EVERYDAY AT BEDTIME     Neurology:  Alzheimer's Agents Passed - 07/26/2020 12:44 AM      Passed - Valid encounter within last 6 months    Recent Outpatient Visits          2 months ago Mild cognitive impairment with memory loss   [facility], Dionne Bucy, MD   4 months ago Mild cognitive impairment with memory loss   [place], PA-C   5 months ago Mild cognitive impairment with memory loss   [place], PA-C   9 months ago Subdural hematoma [facility])   [facility], Dionne Bucy, MD   9 months ago Essential hypertension   [place], Dionne Bucy, MD      Future Appointments            In 1 month Bac[place], Dionne Bucy, MD [facility]

## 2020-07-27 NOTE — Telephone Encounter (Signed)
Pts daughter called and is requesting to know if these new forms have been received. She has been advised by [place] that these have been faxed over. Please advise.

## 2020-07-28 NOTE — Telephone Encounter (Signed)
Received and signed. Given to Clarene Critchley to complete and fax. (will leave on your desk)

## 2020-07-29 NOTE — Telephone Encounter (Signed)
Forms completed and faxed to [facility]. Pt's daughter Sharee Pimple advised. Thanks TNP

## 2020-09-01 DIAGNOSIS — R413 Other amnesia: Secondary | ICD-10-CM | POA: Diagnosis not present

## 2020-09-02 NOTE — Progress Notes (Signed)
Subjective:   Elaine [name] is a 80 y.o. female who presents for Medicare Annual (Subsequent) preventive examination.  I connected with Denijah Mansouri today by telephone and verified that I am speaking with the correct person using two identifiers. Location patient: home Location provider: work Persons participating in the virtual visit: patient, provider.   I discussed the limitations, risks, security and privacy concerns of performing an evaluation and management service by telephone and the availability of in person appointments. I also discussed with the patient that there may be a patient responsible charge related to this service. The patient expressed understanding and verbally consented to this telephonic visit.    Interactive audio and video telecommunications were attempted between this provider and patient, however failed, due to patient having technical difficulties OR patient did not have access to video capability.  We continued and completed visit with audio only.  Review of Systems    N/A  Cardiac Risk Factors include: advanced age (>87men, >24 women);hypertension     Objective:    There were no vitals filed for this visit. There is no height or weight on file to calculate BMI.  Advanced Directives 09/03/2020 02/06/2020 10/08/2019 09/02/2019 08/27/2018 04/14/2017 08/02/2016  Does Patient Have a Medical Advance Directive? Unable to assess, patient is non-responsive or altered mental status No No No No No Yes;No  Does patient want to make changes to medical advance directive? - - - - - - No - Patient declined  Would patient like information on creating a medical advance directive? - No - Patient declined - Yes (MAU/Ambulatory/Procedural Areas - Information given) Yes (MAU/Ambulatory/Procedural Areas - Information given) Yes (ED - Information included in AVS) -    Current Medications (verified) Outpatient Encounter Medications as of 09/03/2020  Medication Sig  . donepezil  (ARICEPT) 10 MG tablet TAKE 1 TABLET BY MOUTH EVERYDAY AT BEDTIME  . enalapril (VASOTEC) 5 MG tablet Take 1 tablet (5 mg total) by mouth daily.  Marland Kitchen ibuprofen (ADVIL) 200 MG tablet Take 200 mg by mouth every 6 (six) hours as needed.  . memantine (NAMENDA) 5 MG tablet TAKE 1 TABLET BY MOUTH EVERY DAY  . sertraline (ZOLOFT) 50 MG tablet TAKE 1 TABLET BY MOUTH  DAILY   No facility-administered encounter medications on file as of 09/03/2020.    Allergies (verified) Patient has no known allergies.   History: Past Medical History:  Diagnosis Date  . Alzheimer disease ([place])   . Depression   . Hypertension    Past Surgical History:  Procedure Laterality Date  . ANKLE SURGERY    . AUGMENTATION MAMMAPLASTY Bilateral 2006  . BREAST BIOPSY Right 1984   EXCISIONAL - NEG  . BREAST ENHANCEMENT SURGERY Bilateral   . HEMORRHOID SURGERY  2011   Hemorrhoidectomy  . TONSILLECTOMY AND ADENOIDECTOMY    . TUBAL LIGATION     Family History  Problem Relation Age of Onset  . Heart attack Father   . Hypertension Sister        pulmonary HTN  . Breast cancer Maternal Aunt    Social History   Socioeconomic History  . Marital status: Widowed    Spouse name: Not on file  . Number of children: 2  . Years of education: H/S  . Highest education level: High school graduate  Occupational History  . Occupation: Retired    Comment: Psychologist, educational, Retail buyer  Tobacco Use  . Smoking status: Never Smoker  . Smokeless tobacco: Never Used  Substance and Sexual Activity  .  Alcohol use: Yes    Alcohol/week: 1.0 standard drink    Types: 1 Shots of liquor per week  . Drug use: No  . Sexual activity: Not on file  Other Topics Concern  . Not on file  Social History Narrative  . Not on file   Social Determinants of Health   Financial Resource Strain: Low Risk   . Difficulty of Paying Living Expenses: Not hard at all  Food Insecurity: No Food Insecurity  . Worried About Charity fundraiser  in the Last Year: Never true  . Ran Out of Food in the Last Year: Never true  Transportation Needs: No Transportation Needs  . Lack of Transportation (Medical): No  . Lack of Transportation (Non-Medical): No  Physical Activity: Sufficiently Active  . Days of Exercise per Week: 5 days  . Minutes of Exercise per Session: 30 min  Stress: Stress Concern Present  . Feeling of Stress : To some extent  Social Connections: Moderately Integrated  . Frequency of Communication with Friends and Family: More than three times a week  . Frequency of Social Gatherings with Friends and Family: Twice a week  . Attends Religious Services: More than 4 times per year  . Active Member of Clubs or Organizations: No  . Attends Archivist Meetings: Never  . Marital Status: Living with partner    Tobacco Counseling Counseling given: Not Answered   Clinical Intake:  Pre-visit preparation completed: Yes  Pain : No/denies pain     Nutritional Risks: Nausea/ vomitting/ diarrhea (Nausea occasionally due to medications.) Diabetes: No  How often do you need to have someone help you when you read instructions, pamphlets, or other written materials from your doctor or pharmacy?: 1 - Never  Diabetic? No  Interpreter Needed?: No  Information entered by :: [facility], LPN   Activities of Daily Living In your present state of health, do you have any difficulty performing the following activities: 09/03/2020 05/11/2020  Hearing? N N  Vision? N Y  Difficulty concentrating or making decisions? Y Y  Comment Has dementia. -  Walking or climbing stairs? N N  Dressing or bathing? N N  Doing errands, shopping? N Y  Conservation officer, nature and eating ? N -  Using the Toilet? N -  In the past six months, have you accidently leaked urine? N -  Do you have problems with loss of bowel control? N -  Managing your Medications? N -  Managing your Finances? N -  Housekeeping or managing your Housekeeping? N -  Some  recent data might be hidden    Patient Care Team: Virginia Crews, MD as PCP - General (Family Medicine) Michaelle Birks Scarlette Ar, MD as Referring Physician (Cardiology) Pa, Golden as Consulting Physician [facility]) Ara Kussmaul, MD as Referring Physician (Neurosurgery)  Indicate any recent Medical Services you may have received from other than [facility] providers in the past year (date may be approximate).     Assessment:   This is a routine wellness examination for Elaine [name].  Hearing/Vision screen No exam data present  Dietary issues and exercise activities discussed: Current Exercise Habits: Home exercise routine, Type of exercise: walking, Time (Minutes): 30, Frequency (Times/Week): 5, Weekly Exercise (Minutes/Week): 150, Intensity: Mild, Exercise limited by: None identified  Goals    . Increase water intake     Recommend to increase water intake to 6-8 8 oz glasses a day.    Marland Kitchen LIFESTYLE - DECREASE FALLS RISK  Recommend to remove any items from the home that may cause slips or trips.       Depression Screen PHQ 2/9 Scores 09/03/2020 05/11/2020 09/02/2019 09/02/2019 05/08/2019 01/23/2019 08/27/2018  PHQ - 2 Score 1 2 0 1 3 2 2   PHQ- 9 Score - 3 3 - 5 5 4     Fall Risk Fall Risk  09/03/2020 05/11/2020 09/02/2019 07/09/2019 08/27/2018  Falls in the past year? 1 1 0 0 1  Number falls in past yr: 0 0 0 0 1  Injury with Fall? 1 1 0 0 1  Comment - - - - Compression fracture  Risk for fall due to : Mental status change History of fall(s) - - -  Follow up Falls prevention discussed Falls evaluation completed - Falls evaluation completed Falls prevention discussed    FALL RISK PREVENTION PERTAINING TO THE HOME:  Any stairs in or around the home? Yes  If so, are there any without handrails? No  Home free of loose throw rugs in walkways, pet beds, electrical cords, etc? Yes  Adequate lighting in your home to reduce risk of falls? Yes   ASSISTIVE DEVICES UTILIZED TO PREVENT  FALLS:  Life alert? No  Use of a cane, walker or w/c? No  Grab bars in the bathroom? No  Shower chair or bench in shower? No  Elevated toilet seat or a handicapped toilet? No    Cognitive Function: MMSE - Mini Mental State Exam 02/27/2020 10/03/2019 07/09/2019 05/08/2019  Orientation to time 5 5 4 3   Orientation to Place 5 5 4 4   Registration 3 3 3 3   Attention/ Calculation 5 5 5 5   Recall 3 3 2 1   Language- name 2 objects 2 2 2 2   Language- repeat 1 1 1 1   Language- follow 3 step command 3 3 3 3   Language- read & follow direction 1 1 1 1   Write a sentence 1 1 1 1   Copy design 1 1 1 1   Total score 30 30 27 25      6CIT Screen 08/02/2016  What Year? 0 points  What month? 0 points  What time? 0 points  Count back from 20 0 points  Months in reverse 0 points  Repeat phrase 2 points  Total Score 2    Immunizations Immunization History  Administered Date(s) Administered  . Fluad Quad(high Dose 65+) 05/11/2020  . Influenza, High Dose Seasonal PF 05/31/2019  . Influenza,inj,Quad PF,6+ Mos 08/28/2015  . Influenza,inj,quad, With Preservative 06/14/2017  . Influenza-Unspecified 06/22/2018  . PFIZER SARS-COV-2 Vaccination 08/28/2019, 09/18/2019, 06/04/2020  . Pneumococcal Conjugate-13 05/28/2014  . Pneumococcal Polysaccharide-23 08/28/2015  . Zoster 11/03/2010  . Zoster Recombinat (Shingrix) 06/12/2019    TDAP status: Due, Education has been provided regarding the importance of this vaccine. Advised may receive this vaccine at local pharmacy or Health Dept. Aware to provide a copy of the vaccination record if obtained from local pharmacy or Health Dept. Verbalized acceptance and understanding.  Flu Vaccine status: Up to date  Pneumococcal vaccine status: Up to date  Covid-19 vaccine status: Completed vaccines  Qualifies for Shingles Vaccine? Yes   Zostavax completed Yes   Shingrix Completed?: No.    Education has been provided regarding the importance of this vaccine.  Patient has been advised to call insurance company to determine out of pocket expense if they have not yet received this vaccine. Advised may also receive vaccine at local pharmacy or Health Dept. Verbalized acceptance and understanding.  Screening Tests Health  Maintenance  Topic Date Due  . DEXA SCAN  04/30/2016  . TETANUS/TDAP  09/03/2021 (Originally 03/08/1960)  . COVID-19 Vaccine (4 - Booster for Pfizer series) 12/03/2020  . INFLUENZA VACCINE  Completed  . PNA vac Low Risk Adult  Completed    Health Maintenance  Health Maintenance Due  Topic Date Due  . DEXA SCAN  04/30/2016    Colorectal cancer screening: No longer required.   Mammogram status: No longer required due to age.  Bone Density status: Currently due. Declined order at this time.   Lung Cancer Screening: (Low Dose CT Chest recommended if Age 46-80 years, 30 pack-year currently smoking OR have quit w/in 15years.) does not qualify.   Additional Screening:  Vision Screening: Recommended annual ophthalmology exams for early detection of glaucoma and other disorders of the eye. Is the patient up to date with their annual eye exam?  Yes  Who is the provider or what is the name of the office in which the patient attends annual eye exams? [facility] If pt is not established with a provider, would they like to be referred to a provider to establish care? No .   Dental Screening: Recommended annual dental exams for proper oral hygiene  Community Resource Referral / Chronic Care Management: CRR required this visit?  No   CCM required this visit?  No      Plan:     I have personally reviewed and noted the following in the patient's chart:   . Medical and social history . Use of alcohol, tobacco or illicit drugs  . Current medications and supplements . Functional ability and status . Nutritional status . Physical activity . Advanced directives . List of other physicians . Hospitalizations, surgeries,  and ER visits in previous 12 months . Vitals . Screenings to include cognitive, depression, and falls . Referrals and appointments  In addition, I have reviewed and discussed with patient certain preventive protocols, quality metrics, and best practice recommendations. A written personalized care plan for preventive services as well as general preventive health recommendations were provided to patient.     Alyiah Ulloa [place], [place]   01/16/7823   Nurse Notes: Pt declines a DEXA order at this time.

## 2020-09-03 ENCOUNTER — Ambulatory Visit (INDEPENDENT_AMBULATORY_CARE_PROVIDER_SITE_OTHER): Payer: Medicare Other

## 2020-09-03 ENCOUNTER — Other Ambulatory Visit: Payer: Self-pay

## 2020-09-03 ENCOUNTER — Ambulatory Visit (INDEPENDENT_AMBULATORY_CARE_PROVIDER_SITE_OTHER): Payer: Medicare Other | Admitting: Family Medicine

## 2020-09-03 ENCOUNTER — Encounter: Payer: Self-pay | Admitting: Family Medicine

## 2020-09-03 VITALS — BP 138/101 | HR 56 | Temp 97.7°F | Ht 63.0 in | Wt 154.9 lb

## 2020-09-03 DIAGNOSIS — Z0000 Encounter for general adult medical examination without abnormal findings: Secondary | ICD-10-CM

## 2020-09-03 DIAGNOSIS — F0390 Unspecified dementia without behavioral disturbance: Secondary | ICD-10-CM

## 2020-09-03 DIAGNOSIS — E7800 Pure hypercholesterolemia, unspecified: Secondary | ICD-10-CM

## 2020-09-03 NOTE — Progress Notes (Signed)
Complete physical exam   Patient: Elaine [name]   DOB: 07-06-1941   80 y.o. Female  MRN: [id number] Visit Date: 09/03/2020  Today's healthcare provider: Lavon Paganini, MD   Chief Complaint  Patient presents with  . Annual Exam   Subjective    Elaine [name] is a 80 y.o. female who presents today for a complete physical exam.  She reports consuming a general diet. The patient does not participate in regular exercise at present. She generally feels well. She reports sleeping fairly well. She does have additional problems to discuss today.   HPI   Elaine [name] is concerned about her memory loss. Her and her husband have noticed that over the last year there has been a measurable decline since February. Notably, in the last month she had an episode of driving where she got lost on a well known route that concerned her husband. She also had trouble baking a recipe that she typically knows very well. In addition penny describes that she has had trouble orienting herself to time, and remembering what day it is. She reports some occasional fullness in her scalp but in is not assosciated with pain or headaches. She recalls some mild dizziness upon standing but has had no changes in vision, weakness or gait changes. She can navigate stairs and bathroom responsibilities without issue.   Additionally, she endorses watery diarrhea. ROS is otherwise normal.    Past Medical History:  Diagnosis Date  . Alzheimer disease ([place])   . Depression   . Hypertension    Past Surgical History:  Procedure Laterality Date  . ANKLE SURGERY    . AUGMENTATION MAMMAPLASTY Bilateral 2006  . BREAST BIOPSY Right 1984   EXCISIONAL - NEG  . BREAST ENHANCEMENT SURGERY Bilateral   . HEMORRHOID SURGERY  2011   Hemorrhoidectomy  . TONSILLECTOMY AND ADENOIDECTOMY    . TUBAL LIGATION     Social History   Socioeconomic History  . Marital status: Widowed    Spouse name: Not on file  . Number of  children: 2  . Years of education: H/S  . Highest education level: High school graduate  Occupational History  . Occupation: Retired    Comment: Psychologist, educational, Retail buyer  Tobacco Use  . Smoking status: Never Smoker  . Smokeless tobacco: Never Used  Substance and Sexual Activity  . Alcohol use: Yes    Alcohol/week: 1.0 standard drink    Types: 1 Shots of liquor per week  . Drug use: No  . Sexual activity: Not on file  Other Topics Concern  . Not on file  Social History Narrative  . Not on file   Social Determinants of Health   Financial Resource Strain: Low Risk   . Difficulty of Paying Living Expenses: Not hard at all  Food Insecurity: No Food Insecurity  . Worried About Charity fundraiser in the Last Year: Never true  . Ran Out of Food in the Last Year: Never true  Transportation Needs: No Transportation Needs  . Lack of Transportation (Medical): No  . Lack of Transportation (Non-Medical): No  Physical Activity: Sufficiently Active  . Days of Exercise per Week: 5 days  . Minutes of Exercise per Session: 30 min  Stress: Stress Concern Present  . Feeling of Stress : To some extent  Social Connections: Moderately Integrated  . Frequency of Communication with Friends and Family: More than three times a week  . Frequency of Social Gatherings with Friends and  Family: Twice a week  . Attends Religious Services: More than 4 times per year  . Active Member of Clubs or Organizations: No  . Attends Archivist Meetings: Never  . Marital Status: Living with partner  Intimate Partner Violence: Not At Risk  . Fear of Current or Ex-Partner: No  . Emotionally Abused: No  . Physically Abused: No  . Sexually Abused: No   Family Status  Relation Name Status  . Father  Deceased  . Mother  Deceased at age 83       lung cancer (non-smoker)  . Sister  Alive  . Mat Aunt  (Not Specified)   Family History  Problem Relation Age of Onset  . Heart attack Father    . Hypertension Sister        pulmonary HTN  . Breast cancer Maternal Aunt    No Known Allergies  Patient Care Team: Virginia Crews, MD as PCP - General (Family Medicine) Michaelle Birks, Scarlette Ar, MD as Referring Physician (Cardiology) Pa, [place] as Consulting Physician (Optometry) Ara Kussmaul, MD as Referring Physician (Neurosurgery)   Medications: Outpatient Medications Prior to Visit  Medication Sig  . donepezil (ARICEPT) 10 MG tablet TAKE 1 TABLET BY MOUTH EVERYDAY AT BEDTIME  . enalapril (VASOTEC) 5 MG tablet Take 1 tablet (5 mg total) by mouth daily.  Marland Kitchen ibuprofen (ADVIL) 200 MG tablet Take 200 mg by mouth every 6 (six) hours as needed.  . memantine (NAMENDA) 5 MG tablet TAKE 1 TABLET BY MOUTH EVERY DAY  . sertraline (ZOLOFT) 50 MG tablet TAKE 1 TABLET BY MOUTH  DAILY   No facility-administered medications prior to visit.    Review of Systems  Constitutional: Negative.   HENT: Negative.   Eyes: Negative.   Respiratory: Negative.   Cardiovascular: Negative.   Gastrointestinal: Negative.   Endocrine: Negative.   Genitourinary: Negative.   Musculoskeletal: Negative.   Skin: Negative.   Allergic/Immunologic: Negative.   Neurological: Negative.   Hematological: Negative.   Psychiatric/Behavioral: Positive for confusion and decreased concentration.      Objective    BP (!) 138/101 (BP Location: Right Arm, Patient Position: Sitting, Cuff Size: Large)   Pulse (!) 56   Temp 97.7 F (36.5 C) (Oral)   Ht 5\' 3"  (1.6 m)   Wt 154 lb 14.4 oz (70.3 kg)   SpO2 100%   BMI 27.44 kg/m    Physical Exam   General: Well nourished, in NAD Lungs: CTA bilaterally, normal work of breathing Cards: RRR no murmurs rubs or gallops GI: Soft Non-tender, Non-distended Psych: Alert and oriented  Mood: 'not sure'  Affect: normal range, euthymic, frustrated when husband discussed memory concerns  Conversation: clear and coherent  Thought process: Logical and  liner  Insight: aware of problem but will make excuses as to its cause  Judgement: intact   Last depression screening scores PHQ 2/9 Scores 09/03/2020 05/11/2020 09/02/2019  PHQ - 2 Score 1 2 0  PHQ- 9 Score - 3 3   Last fall risk screening Fall Risk  09/03/2020  Falls in the past year? 1  Number falls in past yr: 0  Injury with Fall? 1  Comment -  Risk for fall due to : Mental status change  Follow up Falls prevention discussed   Last Audit-C alcohol use screening Alcohol Use Disorder Test (AUDIT) 09/03/2020  1. How often do you have a drink containing alcohol? 2  2. How many drinks containing alcohol do you have on  a typical day when you are drinking? 0  3. How often do you have six or more drinks on one occasion? 0  AUDIT-C Score 2  4. How often during the last year have you found that you were not able to stop drinking once you had started? -  5. How often during the last year have you failed to do what was normally expected from you because of drinking? -  6. How often during the last year have you needed a first drink in the morning to get yourself going after a heavy drinking session? -  7. How often during the last year have you had a feeling of guilt of remorse after drinking? -  8. How often during the last year have you been unable to remember what happened the night before because you had been drinking? -  9. Have you or someone else been injured as a result of your drinking? -  10. Has a relative or friend or a doctor or another health worker been concerned about your drinking or suggested you cut down? -  Alcohol Use Disorder Identification Test Final Score (AUDIT) -  Alcohol Brief Interventions/Follow-up AUDIT Score <7 follow-up not indicated   A score of 3 or more in women, and 4 or more in men indicates increased risk for alcohol abuse, EXCEPT if all of the points are from question 1   No results found for any visits on 09/03/20.  Assessment & Plan    Routine  Health Maintenance and Physical Exam  Exercise Activities and Dietary recommendations Goals    . Increase water intake     Recommend to increase water intake to 6-8 8 oz glasses a day.    Marland Kitchen LIFESTYLE - DECREASE FALLS RISK     Recommend to remove any items from the home that may cause slips or trips.        Immunization History  Administered Date(s) Administered  . Fluad Quad(high Dose 65+) 05/11/2020  . Influenza, High Dose Seasonal PF 05/31/2019  . Influenza,inj,Quad PF,6+ Mos 08/28/2015  . Influenza,inj,quad, With Preservative 06/14/2017  . Influenza-Unspecified 06/22/2018  . PFIZER SARS-COV-2 Vaccination 08/28/2019, 09/18/2019, 06/04/2020  . Pneumococcal Conjugate-13 05/28/2014  . Pneumococcal Polysaccharide-23 08/28/2015  . Zoster 11/03/2010  . Zoster Recombinat (Shingrix) 06/12/2019    Health Maintenance  Topic Date Due  . DEXA SCAN  04/30/2016  . TETANUS/TDAP  09/03/2021 (Originally 03/08/1960)  . COVID-19 Vaccine (4 - Booster for Pfizer series) 12/03/2020  . INFLUENZA VACCINE  Completed  . PNA vac Low Risk Adult  Completed    Discussed health benefits of physical activity, and encouraged her to engage in regular exercise appropriate for her age and condition.  Problem List Items Addressed This Visit      Nervous and Auditory   Mild cognitive impairment with memory loss    Chronic Progressively worsened over last 6 months Husband concerned about driving and forgetting her location NRSG reassured subdural hemorrhage is resolved Neuro exam reassuring Continue current dose of memantine and donepezil F/u in 6 months, 3 months PRN          Other   Hypercholesteremia   Relevant Orders   Comprehensive metabolic panel   Lipid panel   Encounter for annual physical exam - Primary       Return in about 6 months (around 03/03/2021) for chronic disease f/u.     I, Lavon Paganini, MD, have reviewed all documentation for this visit. The documentation on  09/04/20 for  the exam, diagnosis, procedures, and orders are all accurate and complete.   Marleena Shubert, Dionne Bucy, MD, MPH [facility]

## 2020-09-03 NOTE — Patient Instructions (Signed)
Preventive Care 81 Years and Older, Female Preventive care refers to lifestyle choices and visits with your health care provider that can promote health and wellness. This includes:  A yearly physical exam. This is also called an annual wellness visit.  Regular dental and eye exams.  Immunizations.  Screening for certain conditions.  Healthy lifestyle choices, such as: ? Eating a healthy diet. ? Getting regular exercise. ? Not using drugs or products that contain nicotine and tobacco. ? Limiting alcohol use. What can I expect for my preventive care visit? Physical exam Your health care provider will check your:  Height and weight. These may be used to calculate your BMI (body mass index). BMI is a measurement that tells if you are at a healthy weight.  Heart rate and blood pressure.  Body temperature.  Skin for abnormal spots. Counseling Your health care provider may ask you questions about your:  Past medical problems.  Family's medical history.  Alcohol, tobacco, and drug use.  Emotional well-being.  Home life and relationship well-being.  Sexual activity.  Diet, exercise, and sleep habits.  History of falls.  Memory and ability to understand (cognition).  Work and work Statistician.  Pregnancy and menstrual history.  Access to firearms. What immunizations do I need? Vaccines are usually given at various ages, according to a schedule. Your health care provider will recommend vaccines for you based on your age, medical history, and lifestyle or other factors, such as travel or where you work.   What tests do I need? Blood tests  Lipid and cholesterol levels. These may be checked every 5 years, or more often depending on your overall health.  Hepatitis C test.  Hepatitis B test. Screening  Lung cancer screening. You may have this screening every year starting at age 81 if you have a 30-pack-year history of smoking and currently smoke or have quit within  the past 15 years.  Colorectal cancer screening. ? All adults should have this screening starting at age 81 and continuing until age 81. ? Your health care provider may recommend screening at age 81 if you are at increased risk. ? You will have tests every 1-10 years, depending on your results and the type of screening test.  Diabetes screening. ? This is done by checking your blood sugar (glucose) after you have not eaten for a while (fasting). ? You may have this done every 1-3 years.  Mammogram. ? This may be done every 1-2 years. ? Talk with your health care provider about how often you should have regular mammograms.  Abdominal aortic aneurysm (AAA) screening. You may need this if you are a current or former smoker.  BRCA-related cancer screening. This may be done if you have a family history of breast, ovarian, tubal, or peritoneal cancers. Other tests  STD (sexually transmitted disease) testing, if you are at risk.  Bone density scan. This is done to screen for osteoporosis. You may have this done starting at age 81. Talk with your health care provider about your test results, treatment options, and if necessary, the need for more tests. Follow these instructions at home: Eating and drinking  Eat a diet that includes fresh fruits and vegetables, whole grains, lean protein, and low-fat dairy products. Limit your intake of foods with high amounts of sugar, saturated fats, and salt.  Take vitamin and mineral supplements as recommended by your health care provider.  Do not drink alcohol if your health care provider tells you not to drink.  If you drink alcohol: ? Limit how much you have to 0-1 drink a day. ? Be aware of how much alcohol is in your drink. In the [place] one drink equals one 12 oz bottle of beer (355 mL), one 5 oz glass of wine (148 mL), or one 1 oz glass of hard liquor (44 mL).   Lifestyle  Take daily care of your teeth and gums. Brush your teeth every morning  and night with fluoride toothpaste. Floss one time each day.  Stay active. Exercise for at least 30 minutes 5 or more days each week.  Do not use any products that contain nicotine or tobacco, such as cigarettes, e-cigarettes, and chewing tobacco. If you need help quitting, ask your health care provider.  Do not use drugs.  If you are sexually active, practice safe sex. Use a condom or other form of protection in order to prevent STIs (sexually transmitted infections).  Talk with your health care provider about taking a low-dose aspirin or statin.  Find healthy ways to cope with stress, such as: ? Meditation, yoga, or listening to music. ? Journaling. ? Talking to a trusted person. ? Spending time with friends and family. Safety  Always wear your seat belt while driving or riding in a vehicle.  Do not drive: ? If you have been drinking alcohol. Do not ride with someone who has been drinking. ? When you are tired or distracted. ? While texting.  Wear a helmet and other protective equipment during sports activities.  If you have firearms in your house, make sure you follow all gun safety procedures. What's next?  Visit your health care provider once a year for an annual wellness visit.  Ask your health care provider how often you should have your eyes and teeth checked.  Stay up to date on all vaccines. This information is not intended to replace advice given to you by your health care provider. Make sure you discuss any questions you have with your health care provider. Document Revised: 07/29/2020 Document Reviewed: 08/02/2018 Elsevier Patient Education  2021 [employer]  

## 2020-09-03 NOTE — Assessment & Plan Note (Signed)
Chronic Progressively worsened over last 6 months Husband concerned about driving and forgetting her location NRSG reassured subdural hemorrhage is resolved Neuro exam reassuring Continue current dose of memantine and donepezil F/u in 6 months, 3 months PRN

## 2020-09-03 NOTE — Patient Instructions (Signed)
Elaine [name] , Thank you for taking time to come for your Medicare Wellness Visit. I appreciate your ongoing commitment to your health goals. Please review the following plan we discussed and let me know if I can assist you in the future.   Screening recommendations/referrals: Colonoscopy: No longer required.  Mammogram: No longer required.  Bone Density: Currently due, declined order today.  Recommended yearly ophthalmology/optometry visit for glaucoma screening and checkup Recommended yearly dental visit for hygiene and checkup  Vaccinations: Influenza vaccine: Done 05/11/20 Pneumococcal vaccine: Completed series Tdap vaccine: Currently due, declined receiving. Shingles vaccine: Shingrix #2 dose due.    Advanced directives: Patient is unaware if she has a living will or health care power of attorney.  Conditions/risks identified: Fall risk preventatives discussed today.   Next appointment: 10:20 AM today with Dr Brita Romp. Declined scheduling an AWV for 2023 at this time.    Preventive Care 38 Years and Older, Female Preventive care refers to lifestyle choices and visits with your health care provider that can promote health and wellness. What does preventive care include?  A yearly physical exam. This is also called an annual well check.  Dental exams once or twice a year.  Routine eye exams. Ask your health care provider how often you should have your eyes checked.  Personal lifestyle choices, including:  Daily care of your teeth and gums.  Regular physical activity.  Eating a healthy diet.  Avoiding tobacco and drug use.  Limiting alcohol use.  Practicing safe sex.  Taking low-dose aspirin every day.  Taking vitamin and mineral supplements as recommended by your health care provider. What happens during an annual well check? The services and screenings done by your health care provider during your annual well check will depend on your age, overall health, lifestyle  risk factors, and family history of disease. Counseling  Your health care provider may ask you questions about your:  Alcohol use.  Tobacco use.  Drug use.  Emotional well-being.  Home and relationship well-being.  Sexual activity.  Eating habits.  History of falls.  Memory and ability to understand (cognition).  Work and work Statistician.  Reproductive health. Screening  You may have the following tests or measurements:  Height, weight, and BMI.  Blood pressure.  Lipid and cholesterol levels. These may be checked every 5 years, or more frequently if you are over 82 years old.  Skin check.  Lung cancer screening. You may have this screening every year starting at age 43 if you have a 30-pack-year history of smoking and currently smoke or have quit within the past 15 years.  Fecal occult blood test (FOBT) of the stool. You may have this test every year starting at age 55.  Flexible sigmoidoscopy or colonoscopy. You may have a sigmoidoscopy every 5 years or a colonoscopy every 10 years starting at age 19.  Hepatitis C blood test.  Hepatitis B blood test.  Sexually transmitted disease (STD) testing.  Diabetes screening. This is done by checking your blood sugar (glucose) after you have not eaten for a while (fasting). You may have this done every 1-3 years.  Bone density scan. This is done to screen for osteoporosis. You may have this done starting at age 9.  Mammogram. This may be done every 1-2 years. Talk to your health care provider about how often you should have regular mammograms. Talk with your health care provider about your test results, treatment options, and if necessary, the need for more tests. Vaccines  Your health care provider may recommend certain vaccines, such as:  Influenza vaccine. This is recommended every year.  Tetanus, diphtheria, and acellular pertussis (Tdap, Td) vaccine. You may need a Td booster every 10 years.  Zoster vaccine.  You may need this after age 24.  Pneumococcal 13-valent conjugate (PCV13) vaccine. One dose is recommended after age 26.  Pneumococcal polysaccharide (PPSV23) vaccine. One dose is recommended after age 69. Talk to your health care provider about which screenings and vaccines you need and how often you need them. This information is not intended to replace advice given to you by your health care provider. Make sure you discuss any questions you have with your health care provider. Document Released: 09/04/2015 Document Revised: 04/27/2016 Document Reviewed: 06/09/2015 Elsevier Interactive Patient Education  2017 [place] Prevention in the Home Falls can cause injuries. They can happen to people of all ages. There are many things you can do to make your home safe and to help prevent falls. What can I do on the outside of my home?  Regularly fix the edges of walkways and driveways and fix any cracks.  Remove anything that might make you trip as you walk through a door, such as a raised step or threshold.  Trim any bushes or trees on the path to your home.  Use bright outdoor lighting.  Clear any walking paths of anything that might make someone trip, such as rocks or tools.  Regularly check to see if handrails are loose or broken. Make sure that both sides of any steps have handrails.  Any raised decks and porches should have guardrails on the edges.  Have any leaves, snow, or ice cleared regularly.  Use sand or salt on walking paths during winter.  Clean up any spills in your garage right away. This includes oil or grease spills. What can I do in the bathroom?  Use night lights.  Install grab bars by the toilet and in the tub and shower. Do not use towel bars as grab bars.  Use non-skid mats or decals in the tub or shower.  If you need to sit down in the shower, use a plastic, non-slip stool.  Keep the floor dry. Clean up any water that spills on the floor as soon  as it happens.  Remove soap buildup in the tub or shower regularly.  Attach bath mats securely with double-sided non-slip rug tape.  Do not have throw rugs and other things on the floor that can make you trip. What can I do in the bedroom?  Use night lights.  Make sure that you have a light by your bed that is easy to reach.  Do not use any sheets or blankets that are too big for your bed. They should not hang down onto the floor.  Have a firm chair that has side arms. You can use this for support while you get dressed.  Do not have throw rugs and other things on the floor that can make you trip. What can I do in the kitchen?  Clean up any spills right away.  Avoid walking on wet floors.  Keep items that you use a lot in easy-to-reach places.  If you need to reach something above you, use a strong step stool that has a grab bar.  Keep electrical cords out of the way.  Do not use floor polish or wax that makes floors slippery. If you must use wax, use non-skid floor wax.  Do  not have throw rugs and other things on the floor that can make you trip. What can I do with my stairs?  Do not leave any items on the stairs.  Make sure that there are handrails on both sides of the stairs and use them. Fix handrails that are broken or loose. Make sure that handrails are as long as the stairways.  Check any carpeting to make sure that it is firmly attached to the stairs. Fix any carpet that is loose or worn.  Avoid having throw rugs at the top or bottom of the stairs. If you do have throw rugs, attach them to the floor with carpet tape.  Make sure that you have a light switch at the top of the stairs and the bottom of the stairs. If you do not have them, ask someone to add them for you. What else can I do to help prevent falls?  Wear shoes that:  Do not have high heels.  Have rubber bottoms.  Are comfortable and fit you well.  Are closed at the toe. Do not wear sandals.  If  you use a stepladder:  Make sure that it is fully opened. Do not climb a closed stepladder.  Make sure that both sides of the stepladder are locked into place.  Ask someone to hold it for you, if possible.  Clearly mark and make sure that you can see:  Any grab bars or handrails.  First and last steps.  Where the edge of each step is.  Use tools that help you move around (mobility aids) if they are needed. These include:  Canes.  Walkers.  Scooters.  Crutches.  Turn on the lights when you go into a dark area. Replace any light bulbs as soon as they burn out.  Set up your furniture so you have a clear path. Avoid moving your furniture around.  If any of your floors are uneven, fix them.  If there are any pets around you, be aware of where they are.  Review your medicines with your doctor. Some medicines can make you feel dizzy. This can increase your chance of falling. Ask your doctor what other things that you can do to help prevent falls. This information is not intended to replace advice given to you by your health care provider. Make sure you discuss any questions you have with your health care provider. Document Released: 06/04/2009 Document Revised: 01/14/2016 Document Reviewed: 09/12/2014 Elsevier Interactive Patient Education  2017 [employer].

## 2020-09-08 ENCOUNTER — Other Ambulatory Visit: Payer: Self-pay | Admitting: Family Medicine

## 2020-09-08 DIAGNOSIS — G4700 Insomnia, unspecified: Secondary | ICD-10-CM

## 2020-09-10 ENCOUNTER — Telehealth: Payer: Self-pay

## 2020-09-10 ENCOUNTER — Telehealth: Payer: Self-pay | Admitting: Family Medicine

## 2020-09-10 NOTE — Telephone Encounter (Signed)
Tried calling; Pt's voicemail is not set up.  PEC please advise pt below if she calls back.   Thanks,   -Mickel Baas

## 2020-09-10 NOTE — Telephone Encounter (Signed)
Copied from [place] [id number]. Topic: General - Other >> Sep 09, 2020  4:58 PM Tessa Lerner A wrote: Reason for CRM: Patient would like to be contacted by PCP to further discuss being re-prescribed ALPRAZolam Duanne Moron) 0.25 MG Patient declined to make an appointment at the time of call.

## 2020-09-10 NOTE — Telephone Encounter (Signed)
We discussed how this can worsen confusion and fall risk and is therefore not a safe medication for her.  If she needs to discuss anxiety, happy to do a virtual visit.

## 2020-09-11 ENCOUNTER — Other Ambulatory Visit: Payer: Self-pay | Admitting: Family Medicine

## 2020-09-11 DIAGNOSIS — G4700 Insomnia, unspecified: Secondary | ICD-10-CM

## 2020-09-11 NOTE — Telephone Encounter (Signed)
Requested medication (s) are due for refill today: no  Requested medication (s) are on the active medication list: yes  Last refill:  02/19/2019  Future visit scheduled: yes  Notes to clinic: this refill cannot be delegated    Requested Prescriptions  Pending Prescriptions Disp Refills   ALPRAZolam (XANAX) 0.25 MG tablet [Pharmacy Med Name: ALPRAZOLAM 0.25 MG TABLET] 30 tablet     Sig: Take 1 tablet (0.25 mg total) by mouth at bedtime as needed for anxiety or sleep.      Not Delegated - Psychiatry:  Anxiolytics/Hypnotics Failed - 09/11/2020  1:13 PM      Failed - This refill cannot be delegated      Failed - Urine Drug Screen completed in last 360 days      Passed - Valid encounter within last 6 months    Recent Outpatient Visits           1 week ago Encounter for annual physical exam   [facility] [place], Dionne Bucy, MD   4 months ago Mild cognitive impairment with memory loss   [facility], Dionne Bucy, MD   6 months ago Mild cognitive impairment with memory loss   [place], PA-C   6 months ago Mild cognitive impairment with memory loss   [place], PA-C   10 months ago Subdural hematoma [facility])   [facility] Bacig[place], Dionne Bucy, MD       Future Appointments             In 5 months Bacigalupo, Dionne Bucy, MD [facility], [place]

## 2020-09-16 ENCOUNTER — Other Ambulatory Visit: Payer: Self-pay

## 2020-09-16 NOTE — Telephone Encounter (Signed)
[facility] faxed refill request for the following medications:  donepezil (ARICEPT) 10 MG tablet  Please advise. Thanks, [employer]

## 2020-09-17 MED ORDER — DONEPEZIL HCL 10 MG PO TABS: ORAL_TABLET | ORAL | 1 refills | Status: DC

## 2020-09-18 ENCOUNTER — Telehealth: Payer: Self-pay | Admitting: Family Medicine

## 2020-09-18 NOTE — Telephone Encounter (Signed)
Medication Refill - Medication: ALPRAZolam (XANAX) 0.5 MG tablet   Has the patient contacted their pharmacy? Yes.  (Agent: If no, request that the patient contact the pharmacy for the refill.) (Agent: If yes, when and what did the pharmacy advise?)  Preferred Pharmacy (with phone number or street name):  [id number] - [place], [place] [address]  [address] [place] [address]  Phone: [phone] Fax: [phone]     Agent: Please be advised that RX refills may take up to 3 business days. We ask that you follow-up with your pharmacy.

## 2020-09-18 NOTE — Telephone Encounter (Signed)
Please review for refill. Medication not on current med list.  LOV: 09/03/20

## 2020-09-21 ENCOUNTER — Other Ambulatory Visit: Payer: Self-pay | Admitting: Family Medicine

## 2020-09-21 DIAGNOSIS — G4700 Insomnia, unspecified: Secondary | ICD-10-CM

## 2020-09-21 NOTE — Telephone Encounter (Signed)
Have discussed multiple times reason for not refilling this medication (risk of falls, confusion). No refill.

## 2020-09-22 ENCOUNTER — Encounter: Payer: Self-pay | Admitting: Dermatology

## 2020-09-22 ENCOUNTER — Ambulatory Visit: Payer: Medicare Other | Admitting: Dermatology

## 2020-09-22 ENCOUNTER — Other Ambulatory Visit: Payer: Self-pay

## 2020-09-22 DIAGNOSIS — L578 Other skin changes due to chronic exposure to nonionizing radiation: Secondary | ICD-10-CM

## 2020-09-22 DIAGNOSIS — B079 Viral wart, unspecified: Secondary | ICD-10-CM

## 2020-09-22 DIAGNOSIS — L821 Other seborrheic keratosis: Secondary | ICD-10-CM

## 2020-09-22 NOTE — Telephone Encounter (Signed)
LOV 09/03/20  Med not on med list

## 2020-09-22 NOTE — Telephone Encounter (Signed)
Requested medications are due for refill today.  no  Requested medications are on the active medications list.  no Last refill. unknown  Future visit scheduled.   no  Notes to clinic.  Not delegated - this RX has been refused times. PT needs appointment

## 2020-09-22 NOTE — Telephone Encounter (Signed)
Patient reports she will call back, cause its "just making it worse" and hung up.

## 2020-09-22 NOTE — Progress Notes (Signed)
   Follow-Up Visit   Subjective  Elaine [name] is a 80 y.o. female who presents for the following: Follow-up (Patient here today with husband concerning a place on right temple and left temple. Areas were previously treated before but came back. Patient states areas seems to be getting larger.  ).   The following portions of the chart were reviewed this encounter and updated as appropriate:  Tobacco  Allergies  Meds  Problems  Med Hx  Surg Hx  Fam Hx        Objective  Well appearing patient in no apparent distress; mood and affect are within normal limits.  A focused examination was performed including left and right temporal scalp, forehead, neck. Relevant physical exam findings are noted in the Assessment and Plan.  Objective  right temporal scalp: Verrucous waxy plaque  Assessment & Plan  Verruca vulgaris right temporal scalp  Within seborrheic keratosis   Discussed viral etiology and risk of spread.  Discussed multiple treatments may be required to clear warts.  Discussed possible post-treatment dyspigmentation and risk of recurrence.  Prior to procedure, discussed risks of blister formation, small wound, skin dyspigmentation, or rare scar following cryotherapy.   If not resolved, plan biopsy with curettage and cryotherapy to base   Destruction of lesion - right temporal scalp  Destruction method: cryotherapy   Informed consent: discussed and consent obtained   Lesion destroyed using liquid nitrogen: Yes   Cryotherapy cycles:  2 Outcome: patient tolerated procedure well with no complications   Post-procedure details: wound care instructions given      Seborrheic Keratoses Left temporal scalp  - Stuck-on, waxy, tan-brown papules and plaques  - Discussed benign etiology and prognosis. - Observe - Call for any changes  Actinic Damage - chronic, secondary to cumulative UV radiation exposure/sun exposure over time - diffuse scaly erythematous macules  with underlying dyspigmentation - Recommend daily broad spectrum sunscreen SPF 30+ to sun-exposed areas, reapply every 2 hours as needed.  - Call for new or changing lesions.  Return for follow up as needed.  I, Ruthell Rummage, CMA, am acting as scribe for Forest Gleason, MD.  Documentation: I have reviewed the above documentation for accuracy and completeness, and I agree with the above.  Forest Gleason, MD

## 2020-09-22 NOTE — Telephone Encounter (Signed)
Patient advised as below.  

## 2020-09-22 NOTE — Patient Instructions (Addendum)
Viral Warts & Molluscum Contagiosum  Viral warts and molluscum contagiosum are growths of the skin caused by viral infection of the skin. If you have been given the diagnosis of viral warts or molluscum contagiosum there are a few things that you must understand about your condition:  1. There is no guaranteed treatment method available for this condition. 2. Multiple treatments may be required, 3. The treatments may be time consuming and require multiple visits to the dermatology office. 4. The treatment may be expensive. You will be charged each time you come into the office to have the spots treated. 5. The treated areas may develop new lesions further complicating treatment. 6. The treated areas may leave a scar. 7. There is no guarantee that even after multiple treatments that the spots will be successfully treated. 8. These are caused by a viral infection and can be spread to other areas of the skin and to other people by direct contact. Therefore, new spots may occur.  Cryotherapy Aftercare  . Wash gently with soap and water everyday.   Apply Vaseline and Band-Aid daily until healed.  Melanoma ABCDEs  Melanoma is the most dangerous type of skin cancer, and is the leading cause of death from skin disease.  You are more likely to develop melanoma if you:  Have light-colored skin, light-colored eyes, or red or blond hair  Spend a lot of time in the sun  Tan regularly, either outdoors or in a tanning bed  Have had blistering sunburns, especially during childhood  Have a close family member who has had a melanoma  Have atypical moles or large birthmarks  Early detection of melanoma is key since treatment is typically straightforward and cure rates are extremely high if we catch it early.   The first sign of melanoma is often a change in a mole or a new dark spot.  The ABCDE system is a way of remembering the signs of melanoma.  A for asymmetry:  The two halves do not match. B  for border:  The edges of the growth are irregular. C for color:  A mixture of colors are present instead of an even brown color. D for diameter:  Melanomas are usually (but not always) greater than 65mm - the size of a pencil eraser. E for evolution:  The spot keeps changing in size, shape, and color.  Please check your skin once per month between visits. You can use a small mirror in front and a large mirror behind you to keep an eye on the back side or your body.   If you see any new or changing lesions before your next follow-up, please call to schedule a visit.  Please continue daily skin protection including broad spectrum sunscreen SPF 30+ to sun-exposed areas, reapplying every 2 hours as needed when you're outdoors.

## 2020-09-28 ENCOUNTER — Telehealth: Payer: Self-pay

## 2020-09-28 NOTE — Telephone Encounter (Signed)
Copied from [id number]. Topic: General - Inquiry >> Sep 28, 2020 11:37 AM Gillis Ends D wrote: Reason for CRM: Patient would like a call back from Dr. Jacinto Reap in referance to her refill on alprazolam. She can be reached at [phone]. Please advise

## 2020-09-29 NOTE — Telephone Encounter (Signed)
Patient advised that she has been informed about not having alprazolam refilled, due to increase risk of falls and confusions. Patient reports she does not recall being told this before. Patient was last advised about 2 weeks ago.

## 2020-10-21 ENCOUNTER — Other Ambulatory Visit: Payer: Self-pay | Admitting: Family Medicine

## 2020-10-21 DIAGNOSIS — F339 Major depressive disorder, recurrent, unspecified: Secondary | ICD-10-CM

## 2020-10-21 DIAGNOSIS — G4700 Insomnia, unspecified: Secondary | ICD-10-CM

## 2020-10-21 NOTE — Telephone Encounter (Signed)
Copied from [place] [id number]. Topic: Quick Communication - Rx Refill/Question >> Oct 21, 2020  2:12 PM Lenon Curt, Eve[place] A wrote: Medication: ALPRAZolam (XANAX) 0.25 MG tablet  sertraline (ZOLOFT) 50 MG tablet   Has the patient contacted their pharmacy? Patient has been in contact with pharmacy but received unclear communication. Patient was directed, by agent, to contact pharmacy for further assistance if possible.   Preferred Pharmacy (with phone number or street name): [id number] - [place], [place]  Phone:  [phone]  Agent: Please be advised that RX refills may take up to 3 business days. We ask that you follow-up with your pharmacy.

## 2020-10-21 NOTE — Telephone Encounter (Signed)
   Patient is requesting Xanax but not on current medication list Please review to send a new script    Requested Prescriptions  Pending Prescriptions Disp Refills   sertraline (ZOLOFT) 50 MG tablet 90 tablet 1    Sig: Take 1 tablet (50 mg total) by mouth daily.      Psychiatry:  Antidepressants - SSRI Passed - 10/21/2020  2:17 PM      Passed - Completed PHQ-2 or PHQ-9 in the last 360 days      Passed - Valid encounter within last 6 months    Recent Outpatient Visits           1 month ago Encounter for annual physical exam   [facility] [place], Dionne Bucy, MD   5 months ago Mild cognitive impairment with memory loss   [facility], Dionne Bucy, MD   7 months ago Mild cognitive impairment with memory loss   [place], PA-C   7 months ago Mild cognitive impairment with memory loss   [place], PA-C   1 year ago Subdural hematoma [facility])   [place], Dionne Bucy, MD       Future Appointments             In 2 days Bacigalupo, Dionne Bucy, MD [facility], [place]   In 4 months Bacigalupo, Dionne Bucy, MD [facility], [place]

## 2020-10-22 ENCOUNTER — Telehealth: Payer: Self-pay | Admitting: Family Medicine

## 2020-10-22 DIAGNOSIS — G4700 Insomnia, unspecified: Secondary | ICD-10-CM

## 2020-10-22 MED ORDER — SERTRALINE HCL 50 MG PO TABS: 50.0000 mg | ORAL_TABLET | Freq: Every day | ORAL | 1 refills | Status: DC

## 2020-10-22 NOTE — Telephone Encounter (Signed)
This is not on her med list

## 2020-10-23 ENCOUNTER — Ambulatory Visit: Payer: Medicare Other | Admitting: Family Medicine

## 2020-10-23 NOTE — Telephone Encounter (Signed)
Please review. We do not have alprazolam on her med list.

## 2020-10-23 NOTE — Telephone Encounter (Signed)
Patient states she has memory loss and forgot about her 9am appointment scheduled for today with PCP. Patient [facility] to Tuesday 10/27/2020 (next available).   Patient states she received a call from her pharmacy that her sertraline (ZOLOFT) was ready for pick up, patient states she has enough of those pills and not sure If there was confusion because she only needs her ALPRAZolam (XANAX) 0.25 MG tablet . Patient states she has been on ALPRAZolam (XANAX) 0.25 MG tablet for years.   Patient seeking a follow up today regarding status   [id number] - [place], [place] [address] Phone:  [phone]  Fax:  [phone]

## 2020-10-23 NOTE — Telephone Encounter (Signed)
Alprazolam was stopped in 2020 due to memory loss.

## 2020-10-27 ENCOUNTER — Ambulatory Visit: Payer: Medicare Other | Admitting: Family Medicine

## 2020-11-09 ENCOUNTER — Ambulatory Visit: Payer: Self-pay | Admitting: *Deleted

## 2020-11-09 NOTE — Telephone Encounter (Signed)
C/o increasing memory deficits noticed over the last month. Patient reports she was diagnosed with alzheimer's after falling  and hitting her head last year.denies issues with falling now. Patient reports she has not handled that news very well being diagnosed with alzheimer's  and just doesn't want to believe it. Reports not taking medication sertaline as directed and is having difficulty coping with diagnosis. Patient reports she is able to complete ADL and manage money. Husband is positive social support and reports to her he has noticed a decline in her memory since last month. appt scheduled for 11/19/20. Patient is anxious to be seen by PCP earlier if possible. Care advise given. Patient verbalized understanding of care advise and to call back or go to ED if symptoms worsen.   Reason for Disposition . New or worsening memory (forgetfulness) problems  Answer Assessment - Initial Assessment Questions 1. MAIN CONCERN OR SYMPTOM:  "What is your main concern right now?" "What questions do you have?" "What's the main symptom you're worried about?" (e.g., confusion, memory loss)     Not remembering  2. ONSET:  "When did the symptom start (or worsen)?" (minutes, hours, days, weeks)     Greater than a year ago  3. BETTER-SAME-WORSE: "Are you getting better, staying the same, or getting worse compared to the day you were discharged?"     Worse in the last month 4. DIAGNOSIS: "Was patient diagnosed with dementia by a doctor?" If Yes, ask: "When?" (e.g., days, months, years ago)     Yes . Patient fell last year and hit head 5. MEDICATION: "Has there been any change in medications recently?" (e.g., narcotics, antihistamines, benzodiazepines, etc.)     No  6. OTHER SYMPTOMS: "Are there any other symptoms?" (e.g., fever, cough, pain, falling)     Fell  7. SUPPORT: Document living circumstances and support (e.g., family, nursing home)     Lives with husband and has good support system.  Protocols used:  DEMENTIA SYMPTOMS AND QUESTIONS-A-AH

## 2020-11-09 NOTE — Telephone Encounter (Signed)
Can we keep an eye open for any openings that may come up this week?  I am totally booked right now, but maybe something will open and we can call her.

## 2020-11-12 ENCOUNTER — Ambulatory Visit: Payer: Self-pay | Admitting: *Deleted

## 2020-11-12 ENCOUNTER — Telehealth: Payer: Self-pay | Admitting: Family Medicine

## 2020-11-12 NOTE — Telephone Encounter (Signed)
Yes. We did stop Xanax. There have been many calls over the last month or so for refills and we have told her that this was stopped. This can lead to delirium. If she is not improving, may need to be checked out at ER or UC.

## 2020-11-12 NOTE — Telephone Encounter (Signed)
Please FU wth daughter, Elaine [name]. Mother, pt has called her last nite and was bouncing off the walls, has dementia, and Elaine [name] was able to find out she had taken an old bottle of Xanax that she had found in a drawer. Daughter lives a good distance out and is wanting a cb from a nurse in re to this andthe interaction wth other meds as she knows Dr B stopped this a long time ago. Daughter is calling her brother and trying to make contact with someone who can ck in on her mom to monitor the situation. As Elaine [name] was told that it would be most beneficial that she find out the status of her mom this morning in order to have a more beneficial conversation. Elaine,daughter still wants a nurse FU request but states to give her about an hour to gather more facts. CB Elaine [name] at [phone] in about an hr.        Elaine Crews, MD to [facility] Nurse     11/12/20 2:09 PM Note Yes. We did stop Xanax. There have been many calls over the last month or so for refills and we have told her that this was stopped. This can lead to delirium. If she is not improving, may need to be checked out at ER or UC.     Elaine [name] has called back  To thank Dr B as if she has been making request for refills she must not have taken many. Will keep a check on her and has moved her 3/30 appt till 4/7 so she can come in with her mother.

## 2020-11-12 NOTE — Telephone Encounter (Signed)
Summary: clinical call in about an hr for daughter to gather more info re situation Per agent:"   Please FU wth daughter, Sharee Pimple. Mother, pt has called her last nite and was bouncing off the walls, has dementia, and Sharee Pimple was able to find out she had taken an old bottle of Xanax that she had found in a drawer. Daughter lives a good distance out and is wanting a cb from a nurse in re to this andthe interaction wth other meds as she knows Dr B stopped this a long time ago. Daughter is calling her brother and trying to make contact with someone who can ck in on her mom to monitor the situation. As Sharee Pimple was told that it would be most beneficial that she find out the status of her mom this morning in order to have a more beneficial conversation. Jill,daughter still wants a nurse FU request but states to give her about an hour to gather more facts. CB Jill Humpries at [phone] in about an hr."      Spoke with daughter. Clarified pt did not take an entire bottle of Xanax, but a dose from an old prescription. States pt apparently has been taking Xanax although appears discontinued, not on current med profile. Daughter states she does not have a lot of details, is on her way to pts home. Pt with H/O dementia and TBI. States pt very belligerent regarding needing to take Xanax and "Is out". Sharee Pimple is not sure when she last took med, "May have been last several days." States pt very angry and argumentative over issue. Sharee Pimple requesting a CB to discuss how to handle this situation :"Has gotten out of hand." States pt has been calling her to help her get Xanax refilled. SHe will be at pts home after 1pm.  Please advise: CB# [phone]  Reason for Disposition . [1] Caller has URGENT medicine question about med that PCP or specialist prescribed AND [2] triager unable to answer question  Answer Assessment - Initial Assessment Questions 1. NAME of MEDICATION: "What medicine are you calling about?"     Xanax.Please see  summary 2. QUESTION: "What is your question?" (e.g., medication refill, side effect)     *No Answer* 3. PRESCRIBING HCP: "Who prescribed it?" Reason: if prescribed by specialist, call should be referred to that group.     *No Answer* 4. SYMPTOMS: "Do you have any symptoms?"     *No Answer* 5. SEVERITY: If symptoms are present, ask "Are they mild, moderate or severe?"     *No Answer* 6. PREGNANCY:  "Is there any chance that you are pregnant?" "When was your last menstrual period?"     *No Answer*  Protocols used: MEDICATION QUESTION CALL-A-AH

## 2020-11-13 NOTE — Telephone Encounter (Signed)
Patient's daughter advised as below. Sharee Pimple did reschedule her 11/19/20 to 11/26/20 to come in the office with Mrs. Kieth Brightly.

## 2020-11-19 ENCOUNTER — Ambulatory Visit: Payer: Medicare Other | Admitting: Family Medicine

## 2020-11-25 ENCOUNTER — Other Ambulatory Visit: Payer: Self-pay | Admitting: Family Medicine

## 2020-11-25 ENCOUNTER — Telehealth: Payer: Self-pay

## 2020-11-25 NOTE — Telephone Encounter (Signed)
Copied from [place] [phone]. Topic: General - Other >> Nov 25, 2020  2:36 PM Pawlus, Brayton Layman A wrote: Reason for CRM: Pt requested a medication refill on a med that has been discontinued / not on her active med list. Pt wants to know if it can be refilled. ALPRAZolam (XANAX) 0.25 MG tablet. Please call back.

## 2020-11-25 NOTE — Telephone Encounter (Signed)
Patient will need appt please, virtual ok

## 2020-11-26 ENCOUNTER — Ambulatory Visit: Payer: Medicare Other | Admitting: Family Medicine

## 2020-11-26 NOTE — Telephone Encounter (Signed)
Pt has an appt on 4/12 to discuss refills.

## 2020-11-27 ENCOUNTER — Ambulatory Visit: Payer: Self-pay | Admitting: Family Medicine

## 2020-11-27 NOTE — Telephone Encounter (Signed)
Copied from [place] [phone]. Topic: General - Other >> Nov 27, 2020 11:01 AM Yvette Rack wrote: Reason for CRM: Pt daughter Katheren Puller stated pt is experiencing anxiety and she would like to requests that a nurse call her back to discuss and possibly provide some suggestions or tips. Cb#  [phone]

## 2020-11-27 NOTE — Telephone Encounter (Signed)
Patient's daughter Sharee Pimple, on [place], called and says that her mom has been experiencing more anxiety. She says she found some Xanax that she took, which she took them all, and says the Xanax is what she needs to sleep. The daughter says her mom has dementia and she is not understanding why she can't do things she used to do. The daughter says her mom sits and wonders about things all day and get worked up about them. The daughter says there was an appointment that was rescheduled to mid- May, but she says her mom needs to see someone sooner. I called the office and spoke to [place], [place] who scheduled the patient with Huston Foley Just, FNP on Wednesday, 12/02/20 at 1340. I advised the daughter, she verbalized understanding.  Reason for Disposition . Prescription request for new medicine (not a refill)  Answer Assessment - Initial Assessment Questions 1. NAME of MEDICATION: "What medicine are you calling about?"     Needing some medication 2. QUESTION: "What is your question?" (e.g., medication refill, side effect)     My mom is more anxious and wondering if something can be given to calm her down a bit 3. PRESCRIBING HCP: "Who prescribed it?" Reason: if prescribed by specialist, call should be referred to that group.     N/A 4. SYMPTOMS: "Do you have any symptoms?"     N/A 5. SEVERITY: If symptoms are present, ask "Are they mild, moderate or severe?"     N/A 6. PREGNANCY:  "Is there any chance that you are pregnant?" "When was your last menstrual period?"     No  Protocols used: MEDICATION QUESTION CALL-A-AH

## 2020-12-01 ENCOUNTER — Ambulatory Visit: Payer: Medicare Other | Admitting: Family Medicine

## 2020-12-02 ENCOUNTER — Ambulatory Visit (INDEPENDENT_AMBULATORY_CARE_PROVIDER_SITE_OTHER): Payer: Medicare Other | Admitting: Family Medicine

## 2020-12-02 ENCOUNTER — Other Ambulatory Visit: Payer: Self-pay

## 2020-12-02 ENCOUNTER — Encounter: Payer: Self-pay | Admitting: Family Medicine

## 2020-12-02 VITALS — BP 141/52 | HR 57 | Temp 98.0°F | Resp 16 | Ht 63.0 in | Wt 154.0 lb

## 2020-12-02 DIAGNOSIS — F339 Major depressive disorder, recurrent, unspecified: Secondary | ICD-10-CM | POA: Diagnosis not present

## 2020-12-02 DIAGNOSIS — F411 Generalized anxiety disorder: Secondary | ICD-10-CM

## 2020-12-02 MED ORDER — SERTRALINE HCL 100 MG PO TABS: 100.0000 mg | ORAL_TABLET | Freq: Every day | ORAL | 3 refills | Status: DC

## 2020-12-02 NOTE — Progress Notes (Signed)
Established patient visit   Patient: Elaine [name]   DOB: 09-03-1940   80 y.o. Female  MRN: [id number] Visit Date: 12/02/2020  Today's healthcare provider: Laurita Quint Joselynn Amoroso, FNP   No chief complaint on file.  Subjective    HPI  Patient's daughter Sharee Pimple says that her mom has been experiencing more anxiety. She says she found some Xanax that she took, which she took them all, and says the Xanax is what she needs to sleep. The daughter says her mom has dementia and she is not understanding why she can't do things she used to do. The daughter says her mom sits and wonders about things all day and get worked up about them.  Patient also mentions that she has tenderness in the back of her head that comes and goes. She reports that she has had symptoms ever since her fall back in 09/2019.   Was on the Alprazolam in the past Stopped in 2020 by Dr. B due to issues with memory Have had many conversations about this over the past year  Repeated stories and questions noted from the patient about medications and as to her alzheimer's diagnosis Daughter is aware, present and understanding of the plan   Medications: Outpatient Medications Prior to Visit  Medication Sig  . donepezil (ARICEPT) 10 MG tablet TAKE 1 TABLET BY MOUTH EVERYDAY AT BEDTIME  . enalapril (VASOTEC) 5 MG tablet Take 1 tablet (5 mg total) by mouth daily.  Marland Kitchen ibuprofen (ADVIL) 200 MG tablet Take 200 mg by mouth every 6 (six) hours as needed.  . memantine (NAMENDA) 5 MG tablet TAKE 1 TABLET BY MOUTH EVERY DAY  . [DISCONTINUED] sertraline (ZOLOFT) 50 MG tablet Take 1 tablet (50 mg total) by mouth daily.   No facility-administered medications prior to visit.    Review of Systems  Psychiatric/Behavioral: Positive for confusion. Negative for agitation and sleep disturbance. The patient is nervous/anxious. The patient is not hyperactive.        Objective    BP (!) 141/52   Pulse (!) 57   Temp 98 F (36.7 C)   Resp 16    Ht 5\' 3"  (1.6 m)   Wt 154 lb (69.9 kg)   BMI 27.28 kg/m     Physical Exam Constitutional:      General: She is not in acute distress.    Appearance: Normal appearance. She is not ill-appearing.  HENT:     Head: Normocephalic.  Cardiovascular:     Rate and Rhythm: Normal rate and regular rhythm.     Pulses: Normal pulses.     Heart sounds: Normal heart sounds. No murmur heard. No friction rub. No gallop.   Pulmonary:     Effort: Pulmonary effort is normal. No respiratory distress.     Breath sounds: Normal breath sounds. No stridor. No wheezing, rhonchi or rales.  Abdominal:     General: Bowel sounds are normal.     Palpations: Abdomen is soft.     Tenderness: There is no abdominal tenderness.  Musculoskeletal:     Right lower leg: No edema.     Left lower leg: No edema.  Skin:    General: Skin is warm and dry.  Neurological:     Mental Status: She is alert and oriented to person, place, and time.  Psychiatric:        Mood and Affect: Mood normal.        Behavior: Behavior normal.  No results found for any visits on 12/02/20.  Assessment & Plan     Problem List Items Addressed This Visit      Other   Major depressive disorder   Relevant Medications   sertraline (ZOLOFT) 100 MG tablet    Other Visit Diagnoses    Generalized anxiety disorder    -  Primary   Relevant Medications   sertraline (ZOLOFT) 100 MG tablet     Plan Not restarting the alprazolam, had a long conversation about the r/se/b of this  Plan to increase the sertraline from 50 to 100 Continue to monitor for sleep disturbances Had a long discussion about alzheimer's progression and what to monitor for  Return in about 3 months (around 03/03/2021).       [place], [place] [phone] (phone) [phone] (fax)  [place]

## 2020-12-02 NOTE — Patient Instructions (Signed)
Major Depressive Disorder, Adult Major depressive disorder is a mental health condition. This disorder affects feelings. It can also affect the body. Symptoms of this condition last most of the day, almost every day, for 2 weeks. This disorder can affect:  Relationships.  Daily activities, such as work and school.  Activities that you normally like to do. What are the causes? The cause of this condition is not known. The disorder is likely caused by a mix of things, including:  Your personality, such as being a shy person.  Your behavior, or how you act toward others.  Your thoughts and feelings.  Too much alcohol or drugs.  How you react to stress.  Health and mental problems that you have had for a long time.  Things that hurt you in the past (trauma).  Big changes in your life, such as divorce. What increases the risk? The following factors may make you more likely to develop this condition:  Having family members with depression.  Being a woman.  Problems in the family.  Low levels of some brain chemicals.  Things that caused you pain as a child, especially if you lost a parent or were abused.  A lot of stress in your life, such as from: ? Living without basic needs of life, such as food and shelter. ? Being treated poorly because of race, sex, or religion (discrimination).  Health and mental problems that you have had for a long time. What are the signs or symptoms? The main symptoms of this condition are:  Being sad all the time.  Being grouchy all the time.  Loss of interest in things and activities. Other symptoms include:  Sleeping too much or too little.  Eating too much or too little.  Gaining or losing weight, without knowing why.  Feeling tired or having low energy.  Being restless and weak.  Feeling hopeless, worthless, or guilty.  Trouble thinking clearly or making decisions.  Thoughts of hurting yourself or others, or thoughts of  ending your life.  Spending a lot of time alone.  Inability to complete common tasks of daily life. If you have very bad MDD, you may:  Believe things that are not true.  Hear, see, taste, or feel things that are not there.  Have mild depression that lasts for at least 2 years.  Feel very sad and hopeless.  Have trouble speaking or moving. How is this treated? This condition may be treated with:  Talk therapy. This teaches you to know bad thoughts, feelings, and actions and how to change them. ? This can also help you to communicate with others. ? This can be done with members of your family.  Medicines. These can be used to treat worry (anxiety), depression, or low levels of chemicals in the brain.  Lifestyle changes. You may need to: ? Limit alcohol use. ? Limit drug use. ? Get regular exercise. ? Get plenty of sleep. ? Make healthy eating choices. ? Spend more time outdoors.  Brain stimulation. This treatment excites the brain. This is done when symptoms are very bad or have not gotten better with other treatments. Follow these instructions at home: Activity  Get regular exercise as told.  Spend time outdoors as told.  Make time to do the things you enjoy.  Find ways to deal with stress. Try to: ? Meditate. ? Do deep breathing. ? Spend time in nature. ? Keep a journal.  Return to your normal activities as told by your doctor.   Ask your doctor what activities are safe for you. Alcohol and drug use  If you drink alcohol: ? Limit how much you use to:  0-1 drink a day for women.  0-2 drinks a day for men. ? Be aware of how much alcohol is in your drink. In the [place]., one drink equals one 12 oz bottle of beer (355 mL), one 5 oz glass of wine (148 mL), or one 1 oz glass of hard liquor (44 mL).  Talk to your doctor about: ? Alcohol use. Alcohol can affect some medicines. ? Any drug use. General instructions  Take over-the-counter and prescription medicines  and herbal preparations only as told by your doctor.  Eat a healthy diet.  Get a lot of sleep.  Think about joining a support group. Your doctor may be able to suggest one.  Keep all follow-up visits as told by your doctor. This is important.   Where to find more information:  [employer] on Mental Illness: www.nami.[place]: https://carter.com/  American Psychiatric Association: www.psychiatry.org/patients-families/ Contact a doctor if:  Your symptoms get worse.  You get new symptoms. Get help right away if:  You hurt yourself.  You have serious thoughts about hurting yourself or others.  You see, hear, taste, smell, or feel things that are not there. If you ever feel like you may hurt yourself or others, or have thoughts about taking your own life, get help right away. Go to your nearest emergency department or:  Call your local emergency services (911 in the [place]  Call a suicide crisis helpline, such as the [place] at (705)595-8910. This is open 24 hours a day in the [place]  Text the Crisis Text Line at (406) 509-4236 (in the [place].). Summary  Major depressive disorder is a mental health condition. This disorder affects feelings. Symptoms of this condition last most of the day, almost every day, for 2 weeks.  The symptoms of this disorder can cause problems with relationships and with daily activities.  There are treatments and support for people who get this disorder. You may need more than one type of treatment.  Get help right away if you have serious thoughts about hurting yourself or others. This information is not intended to replace advice given to you by your health care provider. Make sure you discuss any questions you have with your health care provider. Document Revised: 07/20/2019 Document Reviewed: 07/20/2019 Elsevier Patient Education  2021 [place]. http://NIMH.NIH.Gov">  Generalized  Anxiety Disorder, Adult Generalized anxiety disorder (GAD) is a mental health condition. Unlike normal worries, anxiety related to GAD is not triggered by a specific event. These worries do not fade or get better with time. GAD interferes with relationships, work, and school. GAD symptoms can vary from mild to severe. People with severe GAD can have intense waves of anxiety with physical symptoms that are similar to panic attacks. What are the causes? The exact cause of GAD is not known, but the following are believed to have an impact:  Differences in natural brain chemicals.  Genes passed down from parents to children.  Differences in the way threats are perceived.  Development during childhood.  Personality. What increases the risk? The following factors may make you more likely to develop this condition:  Being female.  Having a family history of anxiety disorders.  Being very shy.  Experiencing very stressful life events, such as the death of a loved one.  Having a very  stressful family environment. What are the signs or symptoms? People with GAD often worry excessively about many things in their lives, such as their health and family. Symptoms may also include:  Mental and emotional symptoms: ? Worrying excessively about natural disasters. ? Fear of being late. ? Difficulty concentrating. ? Fears that others are judging your performance.  Physical symptoms: ? Fatigue. ? Headaches, muscle tension, muscle twitches, trembling, or feeling shaky. ? Feeling like your heart is pounding or beating very fast. ? Feeling out of breath or like you cannot take a deep breath. ? Having trouble falling asleep or staying asleep, or experiencing restlessness. ? Sweating. ? Nausea, diarrhea, or irritable bowel syndrome (IBS).  Behavioral symptoms: ? Experiencing erratic moods or irritability. ? Avoidance of new situations. ? Avoidance of people. ? Extreme difficulty making  decisions. How is this diagnosed? This condition is diagnosed based on your symptoms and medical history. You will also have a physical exam. Your health care provider may perform tests to rule out other possible causes of your symptoms. To be diagnosed with GAD, a person must have anxiety that:  Is out of his or her control.  Affects several different aspects of his or her life, such as work and relationships.  Causes distress that makes him or her unable to take part in normal activities.  Includes at least three symptoms of GAD, such as restlessness, fatigue, trouble concentrating, irritability, muscle tension, or sleep problems. Before your health care provider can confirm a diagnosis of GAD, these symptoms must be present more days than they are not, and they must last for 6 months or longer. How is this treated? This condition may be treated with:  Medicine. Antidepressant medicine is usually prescribed for long-term daily control. Anti-anxiety medicines may be added in severe cases, especially when panic attacks occur.  Talk therapy (psychotherapy). Certain types of talk therapy can be helpful in treating GAD by providing support, education, and guidance. Options include: ? Cognitive behavioral therapy (CBT). People learn coping skills and self-calming techniques to ease their physical symptoms. They learn to identify unrealistic thoughts and behaviors and to replace them with more appropriate thoughts and behaviors. ? Acceptance and commitment therapy (ACT). This treatment teaches people how to be mindful as a way to cope with unwanted thoughts and feelings. ? Biofeedback. This process trains you to manage your body's response (physiological response) through breathing techniques and relaxation methods. You will work with a therapist while machines are used to monitor your physical symptoms.  Stress management techniques. These include yoga, meditation, and exercise. A mental health  specialist can help determine which treatment is best for you. Some people see improvement with one type of therapy. However, other people require a combination of therapies.   Follow these instructions at home: Lifestyle  Maintain a consistent routine and schedule.  Anticipate stressful situations. Create a plan, and allow extra time to work with your plan.  Practice stress management or self-calming techniques that you have learned from your therapist or your health care provider. General instructions  Take over-the-counter and prescription medicines only as told by your health care provider.  Understand that you are likely to have setbacks. Accept this and be kind to yourself as you persist to take better care of yourself.  Recognize and accept your accomplishments, even if you judge them as small.  Keep all follow-up visits as told by your health care provider. This is important. Contact a health care provider if:  Your symptoms do  not get better.  Your symptoms get worse.  You have signs of depression, such as: ? A persistently sad or irritable mood. ? Loss of enjoyment in activities that used to bring you joy. ? Change in weight or eating. ? Changes in sleeping habits. ? Avoiding friends or family members. ? Loss of energy for normal tasks. ? Feelings of guilt or worthlessness. Get help right away if:  You have serious thoughts about hurting yourself or others. If you ever feel like you may hurt yourself or others, or have thoughts about taking your own life, get help right away. Go to your nearest emergency department or:  Call your local emergency services (911 in the [place].S.).  Call a suicide crisis helpline, such as the [place] at 3807945123. This is open 24 hours a day in the [place]  Text the Crisis Text Line at 442 382 7226 (in the [place].). Summary  Generalized anxiety disorder (GAD) is a mental health condition that involves worry that is  not triggered by a specific event.  People with GAD often worry excessively about many things in their lives, such as their health and family.  GAD may cause symptoms such as restlessness, trouble concentrating, sleep problems, frequent sweating, nausea, diarrhea, headaches, and trembling or muscle twitching.  A mental health specialist can help determine which treatment is best for you. Some people see improvement with one type of therapy. However, other people require a combination of therapies. This information is not intended to replace advice given to you by your health care provider. Make sure you discuss any questions you have with your health care provider. Document Revised: 05/29/2019 Document Reviewed: 05/29/2019 Elsevier Patient Education  [place].

## 2020-12-14 NOTE — Telephone Encounter (Signed)
Opened in error

## 2020-12-24 ENCOUNTER — Telehealth: Payer: Self-pay | Admitting: *Deleted

## 2020-12-24 NOTE — Telephone Encounter (Signed)
Copied from [place] [id number]. Topic: General - Other >> Dec 23, 2020  5:02 PM Pawlus, Brayton Layman A wrote: Reason for CRM: Pt requested a refill on ALPRAZolam (XANAX) 0.25 MG tablet, which has been discontinued, pt wanted to see if she would be able to get this prescribed again.

## 2020-12-25 NOTE — Telephone Encounter (Signed)
See many previous phone messages declining this. She is not a good candidate for this medication

## 2020-12-25 NOTE — Telephone Encounter (Signed)
Patient was advised and reports that she was going to discuss with Dr.B at her visit on May 19,2022.

## 2020-12-30 ENCOUNTER — Other Ambulatory Visit: Payer: Self-pay

## 2020-12-30 ENCOUNTER — Ambulatory Visit: Payer: Medicare Other | Admitting: Dermatology

## 2020-12-30 DIAGNOSIS — L578 Other skin changes due to chronic exposure to nonionizing radiation: Secondary | ICD-10-CM | POA: Diagnosis not present

## 2020-12-30 DIAGNOSIS — L820 Inflamed seborrheic keratosis: Secondary | ICD-10-CM | POA: Diagnosis not present

## 2020-12-30 NOTE — Patient Instructions (Addendum)
Cryotherapy Aftercare  . Wash gently with soap and water everyday.   Marland Kitchen Apply Vaseline and Band-Aid daily until healed.  Prior to procedure, discussed risks of blister formation, small wound, skin dyspigmentation, or rare scar following cryotherapy.   If you have any questions or concerns for your doctor, please call our main line at [phone] and press option 4 to reach your doctor's medical assistant. If no one answers, please leave a voicemail as directed and we will return your call as soon as possible. Messages left after 4 pm will be answered the following business day.   You may also send [place] a message via [facility]. We typically respond to MyChart messages within 1-2 business days.  For prescription refills, please ask your pharmacy to contact our office. Our fax number is [phone].  If you have an urgent issue when the clinic is closed that cannot wait until the next business day, you can page your doctor at the number below.    Please note that while we do our best to be available for urgent issues outside of office hours, we are not available 24/7.   If you have an urgent issue and are unable to reach [place], you may choose to seek medical care at your doctor's office, retail clinic, urgent care center, or emergency room.  If you have a medical emergency, please immediately call 911 or go to the emergency department.  Pager Numbers  - Dr. Nehemiah Massed: [phone]  - Dr. Laurence Ferrari: [phone]  - Dr. Nicole Kindred: [phone]  In the event of inclement weather, please call our main line at [phone] for an update on the status of any delays or closures.  Dermatology Medication Tips: Please keep the boxes that topical medications come in in order to help keep track of the instructions about where and how to use these. Pharmacies typically print the medication instructions only on the boxes and not directly on the medication tubes.   If your medication is too expensive, please  contact our office at [phone] option 4 or send [place] a message through [facility].   We are unable to tell what your co-pay for medications will be in advance as this is different depending on your insurance coverage. However, we may be able to find a substitute medication at lower cost or fill out paperwork to get insurance to cover a needed medication.   If a prior authorization is required to get your medication covered by your insurance company, please allow [place] 1-2 business days to complete this process.  Drug prices often vary depending on where the prescription is filled and some pharmacies may offer cheaper prices.  The website www.goodrx.com contains coupons for medications through different pharmacies. The prices here do not account for what the cost may be with help from insurance (it may be cheaper with your insurance), but the website can give you the price if you did not use any insurance.  - You can print the associated coupon and take it with your prescription to the pharmacy.  - You may also stop by our office during regular business hours and pick up a GoodRx coupon card.  - If you need your prescription sent electronically to a different pharmacy, notify our office through [facility] or by phone at [phone] option 4.

## 2020-12-30 NOTE — Progress Notes (Signed)
   Follow-Up Visit   Subjective  Elaine [name] is a 80 y.o. female who presents for the following: Follow-up (Patient here today for spots at scalp/hairline. She has had ISK's with wart virus treated in the past with LN2. ).  The following portions of the chart were reviewed this encounter and updated as appropriate:   Tobacco  Allergies  Meds  Problems  Med Hx  Surg Hx  Fam Hx      Review of Systems:  No other skin or systemic complaints except as noted in HPI or Assessment and Plan.  Objective  Well appearing patient in no apparent distress; mood and affect are within normal limits.  A focused examination was performed including face, scalp. Relevant physical exam findings are noted in the Assessment and Plan.  Objective  right forehead x 6, right temple x 4, right temporal scalp x 1, frontal scalp x 1, left frontal hairline x 1 (13): Erythematous keratotic or waxy stuck-on papule or plaque.    Assessment & Plan  Inflamed seborrheic keratosis (13) right forehead x 6, right temple x 4, right temporal scalp x 1, frontal scalp x 1, left frontal hairline x 1  Prior to procedure, discussed risks of blister formation, small wound, skin dyspigmentation, or rare scar following cryotherapy.   With wart virus at left frontal scalp    Destruction of lesion - right forehead x 6, right temple x 4, right temporal scalp x 1, frontal scalp x 1, left frontal hairline x 1  Destruction method: cryotherapy   Informed consent: discussed and consent obtained   Lesion destroyed using liquid nitrogen: Yes   Cryotherapy cycles:  2 Outcome: patient tolerated procedure well with no complications   Post-procedure details: wound care instructions given    Actinic Damage - chronic, secondary to cumulative UV radiation exposure/sun exposure over time - diffuse scaly erythematous macules with underlying dyspigmentation - Recommend daily broad spectrum sunscreen SPF 30+ to sun-exposed areas,  reapply every 2 hours as needed.  - Recommend staying in the shade or wearing long sleeves, sun glasses (UVA+UVB protection) and wide brim hats (4-inch brim around the entire circumference of the hat). - Call for new or changing lesions. - Recommend taking Heliocare sun protection supplement daily in sunny weather for additional sun protection. For maximum protection on the sunniest days, you can take up to 2 capsules of regular Heliocare OR take 1 capsule of Heliocare Ultra. For prolonged exposure (such as a full day in the sun), you can repeat your dose of the supplement 4 hours after your first dose.   Return if symptoms worsen or fail to improve.  Elaine [name], RMA, am acting as scribe for Forest Gleason, MD .  Documentation: I have reviewed the above documentation for accuracy and completeness, and I agree with the above.  Forest Gleason, MD

## 2021-01-05 ENCOUNTER — Ambulatory Visit: Payer: Medicare Other | Admitting: Dermatology

## 2021-01-07 ENCOUNTER — Ambulatory Visit: Payer: Medicare Other | Admitting: Family Medicine

## 2021-01-08 ENCOUNTER — Other Ambulatory Visit: Payer: Self-pay | Admitting: Family Medicine

## 2021-01-08 DIAGNOSIS — F339 Major depressive disorder, recurrent, unspecified: Secondary | ICD-10-CM

## 2021-01-10 ENCOUNTER — Encounter: Payer: Self-pay | Admitting: Dermatology

## 2021-01-19 ENCOUNTER — Other Ambulatory Visit: Payer: Self-pay | Admitting: Family Medicine

## 2021-01-19 DIAGNOSIS — G4700 Insomnia, unspecified: Secondary | ICD-10-CM

## 2021-01-19 NOTE — Telephone Encounter (Signed)
Requested medication (s) are due for refill today: Yes  Requested medication (s) are on the active medication list: No  Last refill:  2020  Future visit scheduled: Yes  Notes to clinic:  Unable to refill per protocol, cannot delegate.       Requested Prescriptions  Pending Prescriptions Disp Refills   ALPRAZolam (XANAX) 0.25 MG tablet 30 tablet 0    Sig: Take 1 tablet (0.25 mg total) by mouth at bedtime as needed for anxiety or sleep.      Not Delegated - Psychiatry:  Anxiolytics/Hypnotics Failed - 01/19/2021  5:10 PM      Failed - This refill cannot be delegated      Failed - Urine Drug Screen completed in last 360 days      Passed - Valid encounter within last 6 months    Recent Outpatient Visits           1 month ago Generalized anxiety disorder   [place], Laurita Quint, FNP   4 months ago Encounter for annual physical exam   [facility], Dionne Bucy, MD   8 months ago Mild cognitive impairment with memory loss   [facility], Dionne Bucy, MD   10 months ago Mild cognitive impairment with memory loss   [place], PA-C   10 months ago Mild cognitive impairment with memory loss   [place], Wendee Beavers, PA-C       Future Appointments             In 1 month Bacigalupo, Dionne Bucy, MD [facility] [place], [place]

## 2021-01-19 NOTE — Telephone Encounter (Signed)
Pt is requesting a call back to notify her of whether PCP will agree to refill, please advise. Pt does have a 6 month follow up in July. She is willing to be seen sooner if need be, however no appts are available.  [phone]

## 2021-01-19 NOTE — Telephone Encounter (Signed)
Medication Refill - Medication: ALPRAZolam (XANAX) 0.25 MG tablet   Pt is leaving the country Tuesday June 7th, she is requesting a short supply of this Rx for anxiety/nerves.   Has the patient contacted their pharmacy? No. (Agent: If no, request that the patient contact the pharmacy for the refill.) (Agent: If yes, when and what did the pharmacy advise?)  Preferred Pharmacy (with phone number or street name):  [id number] - [place], [address]  [address] [place] [address]  Phone: [phone] Fax: [phone]     Agent: Please be advised that RX refills may take up to 3 business days. We ask that you follow-up with your pharmacy.

## 2021-01-24 ENCOUNTER — Other Ambulatory Visit: Payer: Self-pay | Admitting: Family Medicine

## 2021-02-10 ENCOUNTER — Telehealth: Payer: Self-pay

## 2021-02-10 NOTE — Telephone Encounter (Signed)
Copied from [place] [id number]. Topic: General - Other >> Feb 10, 2021  2:05 PM Bayard Beaver wrote: Reason for CRM: paitent called in to see if she can get short supply of medication,alprazolam that wasnt' prescribed by [place] while she is out of the country this weekend. She couldn't call the other provider because she threw the bottle away and doesn't have that info now.

## 2021-02-11 NOTE — Telephone Encounter (Signed)
See many previous notes regarding not prescribing alprazolam for this patient given many risks

## 2021-02-12 NOTE — Telephone Encounter (Signed)
Patient has been advised. KW 

## 2021-02-24 ENCOUNTER — Other Ambulatory Visit: Payer: Self-pay | Admitting: Family Medicine

## 2021-03-04 ENCOUNTER — Ambulatory Visit: Payer: Self-pay | Admitting: Family Medicine

## 2021-03-19 DIAGNOSIS — H6123 Impacted cerumen, bilateral: Secondary | ICD-10-CM | POA: Diagnosis not present

## 2021-03-19 DIAGNOSIS — H903 Sensorineural hearing loss, bilateral: Secondary | ICD-10-CM | POA: Diagnosis not present

## 2021-03-29 ENCOUNTER — Other Ambulatory Visit: Payer: Self-pay | Admitting: Family Medicine

## 2021-03-29 ENCOUNTER — Telehealth: Payer: Self-pay | Admitting: Family Medicine

## 2021-03-29 DIAGNOSIS — F339 Major depressive disorder, recurrent, unspecified: Secondary | ICD-10-CM

## 2021-03-29 NOTE — Telephone Encounter (Signed)
Medication Refill - Medication: Sertraline   Has the patient contacted their pharmacy? Yes.   Pt states that she called pharmacy and states that they have not responded to her. Please advise.  (Agent: If no, request that the patient contact the pharmacy for the refill.) (Agent: If yes, when and what did the pharmacy advise?)  Preferred Pharmacy (with phone number or street name):  [id number] [place] [address]  [address] Phone: [phone]: [phone] Hours: Not open 24 hours   Agent: Please be advised that RX refills may take up to 3 business days. We ask that you follow-up with your pharmacy.

## 2021-03-30 ENCOUNTER — Other Ambulatory Visit: Payer: Self-pay

## 2021-03-30 DIAGNOSIS — F339 Major depressive disorder, recurrent, unspecified: Secondary | ICD-10-CM

## 2021-03-30 MED ORDER — SERTRALINE HCL 100 MG PO TABS: 100.0000 mg | ORAL_TABLET | Freq: Every day | ORAL | 0 refills | Status: DC

## 2021-06-03 ENCOUNTER — Ambulatory Visit: Payer: Self-pay | Admitting: *Deleted

## 2021-06-03 NOTE — Telephone Encounter (Signed)
Summary: Dizziness (caller not with patient)   Pt's daughter Elaine [name] called to report that the patient has dizziness and overall not feeling well. She says she will not be able to talk to anyone, Elaine [name] recommends that we speak to [name] (caregiver) please advise. Caller not currently with patient.     Called and spoke initially to Mr. Foley, not on DPR. Conferenced in pt's daughter Elaine [name].  Reports pt with dizziness since yesterday, worsening this AM. "Difficult for pt to walk down hall, lost balance getting out of car." States pt c/o dizziness with turning in bed last night, all night. Spoke to pt, unable to verbalizes sensation, "Not really spinning."  Pt denies any nausea or vomiting. Does report "Maybe vison a little blurry, not much." No excessive sleepiness. BP 30 minutes prior to call 154/72  HR 60. Caregiver [name] states pt is staying hydrated.  Per caregiver, "Severe dizziness and much worse this morning."  Advised ED. Pt's daughter states she will call neurologist in AM. "I understand recommendation for ED but not the best place for my mother. '  If worsens will take her. Assured pt NT would route to practice for PCPs review and final disposition. Verbalizes understanding. Expressed frustration at not being able to get timely appt with PCP. After hours call. Reiterated NT will route for review.  CAre advise provided per protocol.  Reason for Disposition  SEVERE dizziness (e.g., unable to stand, requires support to walk, feels like passing out now)  Answer Assessment - Initial Assessment Questions 1. DESCRIPTION: "Describe your dizziness."     Please see triage summary, info provided by dtr and caregiver 2. LIGHTHEADED: "Do you feel lightheaded?" (e.g., somewhat faint, woozy, weak upon standing)     *No Answer* 3. VERTIGO: "Do you feel like either you or the room is spinning or tilting?" (i.e. vertigo)     *No Answer* 4. SEVERITY: "How bad is it?"  "Do you feel like you are going to faint?"  "Can you stand and walk?"   - MILD: Feels slightly dizzy, but walking normally.   - MODERATE: Feels unsteady when walking, but not falling; interferes with normal activities (e.g., school, work).   - SEVERE: Unable to walk without falling, or requires assistance to walk without falling; feels like passing out now.      *No Answer* 5. ONSET:  "When did the dizziness begin?"     *No Answer* 6. AGGRAVATING FACTORS: "Does anything make it worse?" (e.g., standing, change in head position)     *No Answer* 7. HEART RATE: "Can you tell me your heart rate?" "How many beats in 15 seconds?"  (Note: not all patients can do this)       *No Answer* 8. CAUSE: "What do you think is causing the dizziness?"     *No Answer* 9. RECURRENT SYMPTOM: "Have you had dizziness before?" If Yes, ask: "When was the last time?" "What happened that time?"     *No Answer* 10. OTHER SYMPTOMS: "Do you have any other symptoms?" (e.g., fever, chest pain, vomiting, diarrhea, bleeding)       *No Answer* 11. PREGNANCY: "Is there any chance you are pregnant?" "When was your last menstrual period?"       *No Answer*  Protocols used: Dizziness - Lightheadedness-A-AH

## 2021-06-04 NOTE — Telephone Encounter (Signed)
Can we please check in and see how she is doing and see if anyone has appts for next week when they could see her for this. I do believe that she needs to be evaluated.

## 2021-06-04 NOTE — Telephone Encounter (Signed)
Attempted call to [place], [place] voicemail is full.

## 2021-06-08 DIAGNOSIS — R42 Dizziness and giddiness: Secondary | ICD-10-CM | POA: Diagnosis not present

## 2021-06-08 DIAGNOSIS — H8111 Benign paroxysmal vertigo, right ear: Secondary | ICD-10-CM | POA: Diagnosis not present

## 2021-06-16 DIAGNOSIS — R42 Dizziness and giddiness: Secondary | ICD-10-CM | POA: Diagnosis not present

## 2021-07-05 ENCOUNTER — Ambulatory Visit: Payer: Self-pay

## 2021-07-05 NOTE — Telephone Encounter (Signed)
Pt's sign other called in for pt, verified permission to speak with him from pt, states that pt has been having some issues since she had bad fall in Feb 2021. Says that pt had dizzy spells and went to see an ENT and got that resolved but now pt is having anxiety and staying nauseous with bouts of diarrhea that occur from time to time. He says that the nausea is much worse than the diarrhea and anxiety, she gets nauseous several times throughout the day and night but has no vomiting. Over the past 6 months the nausea has gotten worse. Him and pt are wanting an appt with Dr. Jacinto Reap d/t pt is comfortable with her and knows all her hx. Advised that Dr. B is booked up until 09/10/21. Scheduled pt appt for 09/13/21 at 1040 per pts request. Advised that pt needs to be seen sooner for issues that are going on and another provider would be able to address those until she can see Dr. B but pt refuses and wants to keep appt. Advised if pt gets any worse or symptoms increase then to call back and schedule appt with another provider. Care advice given and they verbalized understanding. No other questions/concerns noted.    Reason for Disposition  Nausea is a chronic symptom (recurrent or ongoing AND present > 4 weeks)  Answer Assessment - Initial Assessment Questions 1. NAUSEA SEVERITY: "How bad is the nausea?" (e.g., mild, moderate, severe; dehydration, weight loss)   - MILD: loss of appetite without change in eating habits   - MODERATE: decreased oral intake without significant weight loss, dehydration, or malnutrition   - SEVERE: inadequate caloric or fluid intake, significant weight loss, symptoms of dehydration     Mild  2. ONSET: "When did the nausea begin?"     Since fall in Feb 2021 but gotten worse in last 6 months 3. VOMITING: "Any vomiting?" If Yes, ask: "How many times today?"     No 4. RECURRENT SYMPTOM: "Have you had nausea before?" If Yes, ask: "When was the last time?" "What happened that time?"      Nausea this morning before getting of bed 5. CAUSE: "What do you think is causing the nausea?"     Unsure  6. PREGNANCY: "Is there any chance you are pregnant?" (e.g., unprotected intercourse, missed birth control pill, broken condom)     No  Protocols used: Nausea-A-AH

## 2021-07-05 NOTE — Telephone Encounter (Signed)
Can we find any appts before then?

## 2021-07-06 NOTE — Telephone Encounter (Signed)
Patient scheduled 12/08 at 11am

## 2021-07-22 ENCOUNTER — Ambulatory Visit: Payer: Medicare Other | Admitting: Dermatology

## 2021-07-22 ENCOUNTER — Other Ambulatory Visit: Payer: Self-pay

## 2021-07-22 DIAGNOSIS — L820 Inflamed seborrheic keratosis: Secondary | ICD-10-CM

## 2021-07-22 DIAGNOSIS — D485 Neoplasm of uncertain behavior of skin: Secondary | ICD-10-CM | POA: Diagnosis not present

## 2021-07-22 NOTE — Progress Notes (Signed)
   Follow-Up Visit   Subjective  Elaine [name] is a 80 y.o. female who presents for the following: Skin Problem (Patient with spots at neck that are irritated and new. She would like them removed. ).  Patient accompanied by husband.   The following portions of the chart were reviewed this encounter and updated as appropriate:   Tobacco  Allergies  Meds  Problems  Med Hx  Surg Hx  Fam Hx      Review of Systems:  No other skin or systemic complaints except as noted in HPI or Assessment and Plan.  Objective  Well appearing patient in no apparent distress; mood and affect are within normal limits.  A focused examination was performed including neck, chest. Relevant physical exam findings are noted in the Assessment and Plan.  right neck 0.9 cm erythematous waxy papule R/O Irritated SK vs Other     left neck Erythematous keratotic or waxy stuck-on papule or plaque.    Assessment & Plan  Neoplasm of uncertain behavior of skin right neck  Epidermal / dermal shaving  Lesion diameter (cm):  0.9 Informed consent: discussed and consent obtained   Timeout: patient name, date of birth, surgical site, and procedure verified   Patient was prepped and draped in usual sterile fashion: area prepped with isopropyl alcohol. Anesthesia: the lesion was anesthetized in a standard fashion   Anesthetic:  1% lidocaine w/ epinephrine 1-100,000 buffered w/ 8.4% NaHCO3 Instrument used: DermaBlade and flexible razor blade   Hemostasis achieved with: aluminum chloride   Outcome: patient tolerated procedure well   Post-procedure details: wound care instructions given   Additional details:  Mupirocin and a bandage applied  Destruction of lesion  Destruction method comment:  Curettage Informed consent: discussed and consent obtained   Timeout:  patient name, date of birth, surgical site, and procedure verified Patient was prepped and draped in usual sterile fashion: area prepped with  isopropyl alcohol. Anesthesia: the lesion was anesthetized in a standard fashion   Anesthetic:  1% lidocaine w/ epinephrine 1-100,000 buffered w/ 8.4% NaHCO3 Hemostasis achieved with:  electrodesiccation Outcome: patient tolerated procedure well with no complications   Post-procedure details: wound care instructions given   Additional details:  Mupirocin and a pressure dressing applied  Specimen 1 - Surgical pathology Differential Diagnosis: R/O Irritated SK vs Other  Check Margins: No 0.9 cm erythematous waxy papule   Inflamed seborrheic keratosis left neck  Symptomatic  Reviewed risk of scar, recurrence, infection   Destruction of lesion - left neck  Destruction method comment:  Curettage Informed consent: discussed and consent obtained   Timeout:  patient name, date of birth, surgical site, and procedure verified Patient was prepped and draped in usual sterile fashion: area prepped with isopropyl alcohol. Anesthesia: the lesion was anesthetized in a standard fashion   Anesthetic:  1% lidocaine w/ epinephrine 1-100,000 buffered w/ 8.4% NaHCO3 Hemostasis achieved with:  electrodesiccation Outcome: patient tolerated procedure well with no complications   Post-procedure details: wound care instructions given   Additional details:  Mupirocin and a pressure dressing applied  Return if symptoms worsen or fail to improve.  Graciella Belton, RMA, am acting as scribe for Forest Gleason, MD .  Documentation: I have reviewed the above documentation for accuracy and completeness, and I agree with the above.  Forest Gleason, MD

## 2021-07-22 NOTE — Patient Instructions (Signed)
Wound Care Instructions  Cleanse wound gently with soap and water once a day then pat dry with clean gauze. Apply a thing coat of Petrolatum (petroleum jelly, "Vaseline") over the wound (unless you have an allergy to this). We recommend that you use a new, sterile tube of Vaseline. Do not pick or remove scabs. Do not remove the yellow or white "healing tissue" from the base of the wound.  Cover the wound with fresh, clean, nonstick gauze and secure with paper tape. You may use Band-Aids in place of gauze and tape if the would is small enough, but would recommend trimming much of the tape off as there is often too much. Sometimes Band-Aids can irritate the skin.  You should call the office for your biopsy report after 1 week if you have not already been contacted.  If you experience any problems, such as abnormal amounts of bleeding, swelling, significant bruising, significant pain, or evidence of infection, please call the office immediately.  FOR ADULT SURGERY PATIENTS: If you need something for pain relief you may take 1 extra strength Tylenol (acetaminophen) AND 2 Ibuprofen (200mg  each) together every 4 hours as needed for pain. (do not take these if you are allergic to them or if you have a reason you should not take them.) Typically, you may only need pain medication for 1 to 3 days.    Recommend daily broad spectrum sunscreen SPF 30+ to sun-exposed areas, reapply every 2 hours as needed. Call for new or changing lesions.  Staying in the shade or wearing long sleeves, sun glasses (UVA+UVB protection) and wide brim hats (4-inch brim around the entire circumference of the hat) are also recommended for sun protection.   If You Need Anything After Your Visit  If you have any questions or concerns for your doctor, please call our main line at [phone] and press option 4 to reach your doctor's medical assistant. If no one answers, please leave a voicemail as directed and we will return your call  as soon as possible. Messages left after 4 pm will be answered the following business day.   You may also send [place] a message via [place]. We typically respond to MyChart messages within 1-2 business days.  For prescription refills, please ask your pharmacy to contact our office. Our fax number is [phone].  If you have an urgent issue when the clinic is closed that cannot wait until the next business day, you can page your doctor at the number below.    Please note that while we do our best to be available for urgent issues outside of office hours, we are not available 24/7.   If you have an urgent issue and are unable to reach [place], you may choose to seek medical care at your doctor's office, retail clinic, urgent care center, or emergency room.  If you have a medical emergency, please immediately call 911 or go to the emergency department.  Pager Numbers  - Dr. Nehemiah Massed: [phone]  - Dr. Laurence Ferrari: [phone]  - Dr. Nicole Kindred: [phone]  In the event of inclement weather, please call our main line at [phone] for an update on the status of any delays or closures.  Dermatology Medication Tips: Please keep the boxes that topical medications come in in order to help keep track of the instructions about where and how to use these. Pharmacies typically print the medication instructions only on the boxes and not directly on the medication tubes.   If your medication is too  expensive, please contact our office at [phone] option 4 or send [place] a message through [place].   We are unable to tell what your co-pay for medications will be in advance as this is different depending on your insurance coverage. However, we may be able to find a substitute medication at lower cost or fill out paperwork to get insurance to cover a needed medication.   If a prior authorization is required to get your medication covered by your insurance company, please allow [place] 1-2 business days to complete  this process.  Drug prices often vary depending on where the prescription is filled and some pharmacies may offer cheaper prices.  The website www.goodrx.com contains coupons for medications through different pharmacies. The prices here do not account for what the cost may be with help from insurance (it may be cheaper with your insurance), but the website can give you the price if you did not use any insurance.  - You can print the associated coupon and take it with your prescription to the pharmacy.  - You may also stop by our office during regular business hours and pick up a GoodRx coupon card.  - If you need your prescription sent electronically to a different pharmacy, notify our office through [facility] or by phone at [phone] option 4.     Si Usted Necesita Algo Despus de Su Visita  Tambin puede enviarnos un mensaje a travs de Pharmacist, community. Por lo general respondemos a los mensajes de MyChart en el transcurso de 1 a 2 das hbiles.  Para renovar recetas, por favor pida a su farmacia que se ponga en contacto con nuestra oficina. Harland Dingwall de fax es [place] [phone].  Si tiene un asunto urgente cuando la clnica est cerrada y que no puede esperar hasta el siguiente da hbil, puede llamar/localizar a su doctor(a) al nmero que aparece a continuacin.   Por favor, tenga en cuenta que aunque hacemos todo lo posible para estar disponibles para asuntos urgentes fuera del horario de [place], no estamos disponibles las 24 horas del da, los 7 das de la [place].   Si tiene un problema urgente y no puede comunicarse con nosotros, puede optar por buscar atencin mdica  en el consultorio de su doctor(a), en una clnica privada, en un centro de atencin urgente o en una sala de emergencias.  Si tiene Engineering geologist, por favor llame inmediatamente al 911 o vaya a la sala de emergencias.  Nmeros de bper  - Dr. Nehemiah Massed: [phone]  - Dra. Moye: [phone]  -  Dra. Nicole Kindred: [phone]  En caso de inclemencias del [place], por favor llame a Johnsie Kindred principal al [phone] para una actualizacin sobre el [facility] de cualquier retraso o cierre.  Consejos para la medicacin en dermatologa: Por favor, guarde las cajas en las que vienen los medicamentos de uso tpico para ayudarle a seguir las instrucciones sobre dnde y cmo usarlos. Las farmacias generalmente imprimen las instrucciones del medicamento slo en las cajas y no directamente en los tubos del [place].   Si su medicamento es muy caro, por favor, pngase en contacto con Zigmund Daniel llamando al [phone] y presione la opcin 4 o envenos un mensaje a travs de Pharmacist, community.   No podemos decirle cul ser su copago por los medicamentos por adelantado ya que esto es diferente dependiendo de la cobertura de su seguro. Sin embargo, es posible que podamos encontrar un medicamento sustituto a Electrical engineer un formulario para que el seguro  cubra el medicamento que se considera necesario.   Si se requiere una autorizacin previa para que su compaa de seguros [place] su medicamento, por favor permtanos de 1 a 2 das hbiles para completar este proceso.  Los precios de los medicamentos varan con frecuencia dependiendo del Environmental consultant de dnde se surte la receta y alguna farmacias pueden ofrecer precios ms baratos.  El sitio web www.goodrx.com tiene cupones para medicamentos de Airline pilot. Los precios aqu no tienen en cuenta lo que podra costar con la ayuda del seguro (puede ser ms barato con su seguro), pero el sitio web puede darle el precio si no utiliz Research scientist (physical sciences).  - Puede imprimir el cupn correspondiente y llevarlo con su receta a la farmacia.  - Tambin puede pasar por nuestra oficina durante el horario de atencin regular y Charity fundraiser una tarjeta de cupones de GoodRx.  - Si necesita que su receta se enve electrnicamente a una farmacia diferente, informe a nuestra  oficina a travs de MyChart de [place] o por telfono llamando al [phone] y presione la opcin 4.

## 2021-07-23 ENCOUNTER — Telehealth: Payer: Self-pay

## 2021-07-23 NOTE — Telephone Encounter (Signed)
Copied from [place] [id number]. Topic: Appointment Scheduling - Scheduling Inquiry for Clinic >> Jul 23, 2021 10:25 AM Alanda Slim E wrote: Reason for CRM: Pts January appt was moved up to 12.8.22/ pts daughter called to see why and to see if they can get that appt back but for her usual Januray CPE appt / I advised daughter of available CPE slot in April but she would like to see if Dr. B is ok with doing it in January / please advise   / if so they will cancel the appt on 12.8.22

## 2021-07-26 NOTE — Telephone Encounter (Signed)
Patients' daughter Sharee Pimple called to cancel and reschedule appt 07/29/21 to 10/11/21. She reports patient is doing well and does not need appt for this week.

## 2021-07-29 ENCOUNTER — Encounter: Payer: Self-pay | Admitting: Dermatology

## 2021-07-29 ENCOUNTER — Ambulatory Visit: Payer: Medicare Other | Admitting: Family Medicine

## 2021-09-06 ENCOUNTER — Telehealth: Payer: Self-pay | Admitting: Family Medicine

## 2021-09-06 NOTE — Telephone Encounter (Signed)
Copied from [place] [phone]. Topic: Medicare AWV >> Sep 06, 2021  9:58 AM Cher Nakai R wrote: Reason for CRM:  No answer unable to leave a message for patient to call back and schedule Medicare Annual Wellness Visit (AWV) in office.   If not able to come in office, please offer to do virtually or by telephone.   Last AWV: 09/03/2020  Please schedule at anytime with [facility] Health Advisor.  If any questions, please contact me at [phone]

## 2021-09-13 ENCOUNTER — Ambulatory Visit: Payer: Medicare Other | Admitting: Family Medicine

## 2021-10-11 ENCOUNTER — Other Ambulatory Visit: Payer: Self-pay

## 2021-10-11 ENCOUNTER — Encounter: Payer: Self-pay | Admitting: Family Medicine

## 2021-10-11 ENCOUNTER — Ambulatory Visit (INDEPENDENT_AMBULATORY_CARE_PROVIDER_SITE_OTHER): Payer: Medicare Other | Admitting: Family Medicine

## 2021-10-11 ENCOUNTER — Telehealth: Payer: Self-pay

## 2021-10-11 VITALS — BP 173/64 | HR 61 | Temp 97.7°F | Resp 16 | Ht 63.0 in | Wt 156.0 lb

## 2021-10-11 DIAGNOSIS — M810 Age-related osteoporosis without current pathological fracture: Secondary | ICD-10-CM

## 2021-10-11 DIAGNOSIS — F331 Major depressive disorder, recurrent, moderate: Secondary | ICD-10-CM

## 2021-10-11 DIAGNOSIS — E7800 Pure hypercholesterolemia, unspecified: Secondary | ICD-10-CM

## 2021-10-11 DIAGNOSIS — I10 Essential (primary) hypertension: Secondary | ICD-10-CM

## 2021-10-11 DIAGNOSIS — Z0000 Encounter for general adult medical examination without abnormal findings: Secondary | ICD-10-CM

## 2021-10-11 DIAGNOSIS — F339 Major depressive disorder, recurrent, unspecified: Secondary | ICD-10-CM | POA: Diagnosis not present

## 2021-10-11 DIAGNOSIS — F329 Major depressive disorder, single episode, unspecified: Secondary | ICD-10-CM | POA: Insufficient documentation

## 2021-10-11 DIAGNOSIS — I480 Paroxysmal atrial fibrillation: Secondary | ICD-10-CM | POA: Diagnosis not present

## 2021-10-11 DIAGNOSIS — F03B3 Unspecified dementia, moderate, with mood disturbance: Secondary | ICD-10-CM | POA: Diagnosis not present

## 2021-10-11 MED ORDER — ENALAPRIL MALEATE 5 MG PO TABS
5.0000 mg | ORAL_TABLET | Freq: Every day | ORAL | 1 refills | Status: DC
Start: 2021-10-11 — End: 2022-01-28

## 2021-10-11 MED ORDER — SERTRALINE HCL 50 MG PO TABS: 50.0000 mg | ORAL_TABLET | Freq: Every day | ORAL | 1 refills | Status: DC

## 2021-10-11 NOTE — Assessment & Plan Note (Signed)
Chronic and uncontrolled Not currently taking enalapril Will resume and recheck in ~6 wks

## 2021-10-11 NOTE — Patient Instructions (Addendum)
Check with [facility] on whether you had the 2nd Shingrix or not and consider getting it if you haven't   Also check with [facility] about the tetanus shot (TDAP)  Kieth Brightly agrees to Neurology referral

## 2021-10-11 NOTE — Assessment & Plan Note (Signed)
Chronic and worsening No symptoms of frontotemporal dementia or hallucinations c/w Lewy Body dementia Did not tolerate namenda/aricept due to GI upset Was cleared from [place] re subdural hemorrhage

## 2021-10-11 NOTE — Assessment & Plan Note (Signed)
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

## 2021-10-11 NOTE — Telephone Encounter (Signed)
Called patient's daughter Sharee Pimple No answered. Patient needs a 6 weeks follow up with Dr. Jacinto Reap. I can put patient in 03/27 at 10:20 am. If this works for her I can go ahead and put patient on that time slot. OK for [facility] nurse to take give message.

## 2021-10-11 NOTE — Telephone Encounter (Signed)
Noted. Appt scheduled for 10:20 on 03/27

## 2021-10-11 NOTE — Assessment & Plan Note (Signed)
Reviewed last lipid panel Not currently on a statin Recheck FLP and CMP Discussed diet and exercise  

## 2021-10-11 NOTE — Telephone Encounter (Signed)
Daughter states that day and time is OK. 3/27 at 10 am

## 2021-10-11 NOTE — Progress Notes (Signed)
Annual Wellness Visit     Patient: Elaine [name], Female    DOB: 1940-09-22, 81 y.o.   MRN: [id number] Visit Date: 10/11/2021  Today's Provider: Lavon Paganini, MD    I,Joseline E Rosas,acting as a scribe for Lavon Paganini, MD.,have documented all relevant documentation on the behalf of Lavon Paganini, MD,as directed by  Lavon Paganini, MD while in the presence of Lavon Paganini, MD.   Chief Complaint  Patient presents with   Annual Exam   Subjective    Elaine [name] is a 81 y.o. female who presents today for her Annual Wellness Visit. She reports consuming a general diet. Home exercise routine includes walking. She generally feels well. She reports sleeping well. She does have additional problems to discuss today. Having more difficulty with memory loss. Not sure if she is taking any of her medications.  Daughter doing her finances. Caregiver stress. Frequent arguments. No visual hallucinations.  HPI    Medications: Outpatient Medications Prior to Visit  Medication Sig   ibuprofen (ADVIL) 200 MG tablet Take 200 mg by mouth every 6 (six) hours as needed.   [DISCONTINUED] donepezil (ARICEPT) 10 MG tablet TAKE 1 TABLET BY MOUTH EVERYDAY AT BEDTIME   [DISCONTINUED] enalapril (VASOTEC) 5 MG tablet TAKE 1 TABLET BY MOUTH  DAILY   [DISCONTINUED] memantine (NAMENDA) 5 MG tablet TAKE 1 TABLET BY MOUTH EVERY DAY   [DISCONTINUED] sertraline (ZOLOFT) 100 MG tablet Take 1 tablet (100 mg total) by mouth daily.   No facility-administered medications prior to visit.    No Known Allergies  Patient Care Team: Virginia Crews, MD as PCP - General (Family Medicine) Michaelle Birks Scarlette Ar, MD as Referring Physician (Cardiology) Pa, [place] as Consulting Physician [facility]) Ara Kussmaul, MD as Referring Physician (Neurosurgery)  Review of Systems  Gastrointestinal:  Positive for abdominal pain and diarrhea.  Neurological:  Positive for  dizziness.  Psychiatric/Behavioral:  Positive for agitation, behavioral problems, confusion, decreased concentration, dysphoric mood and sleep disturbance. The patient is nervous/anxious.    Last CBC Lab Results  Component Value Date   WBC 5.8 02/06/2020   HGB 12.9 02/06/2020   HCT 38.1 02/06/2020   MCV 91.6 02/06/2020   MCH 31.0 02/06/2020   RDW 12.1 02/06/2020   PLT 175 00/86/7619   Last metabolic panel Lab Results  Component Value Date   GLUCOSE 88 02/06/2020   NA 139 02/06/2020   K 3.7 02/06/2020   CL 107 02/06/2020   CO2 23 02/06/2020   BUN 21 02/06/2020   CREATININE 0.66 02/06/2020   GFRNONAA >60 02/06/2020   CALCIUM 8.9 02/06/2020   PROT 7.4 10/08/2019   ALBUMIN 4.3 10/08/2019   LABGLOB 2.5 09/02/2019   AGRATIO 1.7 09/02/2019   BILITOT 1.0 10/08/2019   ALKPHOS 81 10/08/2019   AST 39 10/08/2019   ALT 19 10/08/2019   ANIONGAP 9 02/06/2020   Last lipids Lab Results  Component Value Date   CHOL 199 09/02/2019   HDL 35 (L) 09/02/2019   LDLCALC 150 (H) 09/02/2019   TRIG 79 09/02/2019   CHOLHDL 5.7 (H) 09/02/2019   Last hemoglobin A1c Lab Results  Component Value Date   HGBA1C 5.3 12/11/2015   Last thyroid functions Lab Results  Component Value Date   TSH 0.688 02/07/2020   Last vitamin D Lab Results  Component Value Date   VD25OH 37.9 08/02/2016   Last vitamin B12 and Folate Lab Results  Component Value Date   VITAMINB12 372 05/08/2019  Objective    Vitals: BP (!) 173/64 (BP Location: Right Arm, Patient Position: Sitting, Cuff Size: Large)    Pulse 61    Temp 97.7 F (36.5 C) (Oral)    Resp 16    Ht 5\' 3"  (1.6 m)    Wt 156 lb (70.8 kg)    BMI 27.63 kg/m  BP Readings from Last 3 Encounters:  10/11/21 (!) 173/64  12/02/20 (!) 141/52  09/03/20 (!) 138/101   Wt Readings from Last 3 Encounters:  10/11/21 156 lb (70.8 kg)  12/02/20 154 lb (69.9 kg)  09/03/20 154 lb 14.4 oz (70.3 kg)      Physical Exam Vitals reviewed.   Constitutional:      General: She is not in acute distress.    Appearance: Normal appearance. She is well-developed. She is not diaphoretic.  HENT:     Head: Normocephalic and atraumatic.  Eyes:     General: No scleral icterus.    Conjunctiva/sclera: Conjunctivae normal.  Neck:     Thyroid: No thyromegaly.  Cardiovascular:     Rate and Rhythm: Normal rate and regular rhythm.     Pulses: Normal pulses.     Heart sounds: Normal heart sounds. No murmur heard. Pulmonary:     Effort: Pulmonary effort is normal. No respiratory distress.     Breath sounds: Normal breath sounds. No wheezing, rhonchi or rales.  Musculoskeletal:     Cervical back: Neck supple.     Right lower leg: No edema.     Left lower leg: No edema.  Lymphadenopathy:     Cervical: No cervical adenopathy.  Skin:    General: Skin is warm and dry.     Findings: No rash.  Neurological:     General: No focal deficit present.     Mental Status: She is alert. She is disoriented.     Most recent functional status assessment: No flowsheet data found. Most recent fall risk assessment: Fall Risk  09/03/2020  Falls in the past year? 1  Number falls in past yr: 0  Injury with Fall? 1  Comment -  Risk for fall due to : Mental status change  Follow up Falls prevention discussed    Most recent depression screenings: PHQ 2/9 Scores 12/02/2020 09/03/2020  PHQ - 2 Score 1 1  PHQ- 9 Score 3 -   Most recent cognitive screening: 6CIT Screen 09/03/2020  What Year? 0 points  What month? 0 points  What time? 0 points  Count back from 20 0 points  Months in reverse 0 points  Repeat phrase 10 points  Total Score 10   Most recent Audit-C alcohol use screening Alcohol Use Disorder Test (AUDIT) 09/03/2020  1. How often do you have a drink containing alcohol? 2  2. How many drinks containing alcohol do you have on a typical day when you are drinking? 0  3. How often do you have six or more drinks on one occasion? 0  AUDIT-C  Score 2  4. How often during the last year have you found that you were not able to stop drinking once you had started? -  5. How often during the last year have you failed to do what was normally expected from you because of drinking? -  6. How often during the last year have you needed a first drink in the morning to get yourself going after a heavy drinking session? -  7. How often during the last year have you had a feeling  of guilt of remorse after drinking? -  8. How often during the last year have you been unable to remember what happened the night before because you had been drinking? -  9. Have you or someone else been injured as a result of your drinking? -  10. Has a relative or friend or a doctor or another health worker been concerned about your drinking or suggested you cut down? -  Alcohol Use Disorder Identification Test Final Score (AUDIT) -  Alcohol Brief Interventions/Follow-up AUDIT Score <7 follow-up not indicated   A score of 3 or more in women, and 4 or more in men indicates increased risk for alcohol abuse, EXCEPT if all of the points are from question 1   No results found for any visits on 10/11/21.  Assessment & Plan     Annual wellness visit done today including the all of the following: Reviewed patient's Family Medical History Reviewed and updated list of patient's medical providers Assessment of cognitive impairment was done Assessed patient's functional ability Established a written schedule for health screening [place] Completed and Reviewed  Exercise Activities and Dietary recommendations  Goals      Increase water intake     Recommend to increase water intake to 6-8 8 oz glasses a day.     LIFESTYLE - DECREASE FALLS RISK     Recommend to remove any items from the home that may cause slips or trips.         Immunization History  Administered Date(s) Administered   Fluad Quad(high Dose 65+) 05/11/2020   Influenza, High  Dose Seasonal PF 05/31/2019   Influenza,inj,Quad PF,6+ Mos 08/28/2015   Influenza,inj,quad, With Preservative 06/14/2017   Influenza-Unspecified 06/22/2018, 06/08/2021   PFIZER Comirnaty(Gray Top)Covid-19 Tri-Sucrose Vaccine 01/01/2021   PFIZER(Purple Top)SARS-COV-2 Vaccination 08/28/2019, 09/18/2019, 06/04/2020   Pfizer Covid-19 Vaccine Bivalent Booster 91yrs & up 06/08/2021   Pneumococcal Conjugate-13 05/28/2014   Pneumococcal Polysaccharide-23 08/28/2015   Zoster Recombinat (Shingrix) 06/12/2019   Zoster, Live 11/03/2010    Health Maintenance  Topic Date Due   TETANUS/TDAP  Never done   Zoster Vaccines- Shingrix (2 of 2) 08/07/2019   Pneumonia Vaccine 23+ Years old  Completed   INFLUENZA VACCINE  Completed   DEXA SCAN  Completed   COVID-19 Vaccine  Completed   HPV VACCINES  Aged Out     Discussed health benefits of physical activity, and encouraged her to engage in regular exercise appropriate for her age and condition.    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      Relevant Medications   enalapril (VASOTEC) 5 MG tablet     Nervous and Auditory   Dementia (HCC)    Chronic and worsening No symptoms of frontotemporal dementia or hallucinations c/w Lewy Body dementia Did not tolerate namenda/aricept due to GI upset Was cleared from [place] re subdural hemorrhage      Relevant Medications   sertraline (ZOLOFT) 50 MG tablet   Other Relevant Orders   Ambulatory referral to Neurology     Musculoskeletal  and Integument   Osteoporosis, post-menopausal    Declines repeat  DEXA as she would not take any treatment for it regardless        Other   Hypercholesteremia    Reviewed last lipid panel Not currently on a statin Recheck FLP and CMP Discussed diet and exercise       Relevant Medications   enalapril (VASOTEC) 5 MG tablet   Other Relevant Orders   Lipid panel   Encounter for annual physical exam   MDD (major depressive disorder)    May or may not be taking zoloft anymore She does get tearful in misunderstanding re memory Will resume zoloft 50mg  daily F.u in 6 wks to reassess      Relevant Medications   sertraline (ZOLOFT) 50 MG tablet   RESOLVED: Major depressive disorder   Relevant Medications   sertraline (ZOLOFT) 50 MG tablet   Other Visit Diagnoses     Encounter for annual wellness visit (AWV) in Medicare patient    -  Primary        Return in about 6 weeks (around 11/22/2021) for BP f/u, chronic disease f/u.     I, Lavon Paganini, MD, have reviewed all documentation for this visit. The documentation on 10/11/21 for the exam, diagnosis, procedures, and orders are all accurate and complete.   Candy Leverett, Dionne Bucy, MD, MPH [facility]

## 2021-10-11 NOTE — Assessment & Plan Note (Addendum)
May or may not be taking zoloft anymore She does get tearful in misunderstanding re memory Will resume zoloft 50mg  daily F.u in 6 wks to reassess

## 2021-10-11 NOTE — Assessment & Plan Note (Signed)
Declines repeat DEXA as she would not take any treatment for it regardless

## 2021-11-12 NOTE — Progress Notes (Signed)
?  ? ?I,Sulibeya S Dimas,acting as a scribe for Lavon Paganini, MD.,have documented all relevant documentation on the behalf of Lavon Paganini, MD,as directed by  Lavon Paganini, MD while in the presence of Lavon Paganini, MD. ? ? ?Established patient visit ? ? ?Patient: Elaine [name]   DOB: 1940/12/26   81 y.o. Female  MRN: [id number] ?Visit Date: 11/15/2021 ? ?Today's healthcare provider: Lavon Paganini, MD  ? ?Chief Complaint  ?Patient presents with  ? Hypertension  ? Depression  ? ?Subjective  ?  ?HPI  ?Hypertension, follow-up ? ?BP Readings from Last 3 Encounters:  ?11/15/21 140/75  ?10/11/21 (!) 173/64  ?12/02/20 (!) 141/52  ? Wt Readings from Last 3 Encounters:  ?11/15/21 156 lb (70.8 kg)  ?10/11/21 156 lb (70.8 kg)  ?12/02/20 154 lb (69.9 kg)  ?  ? ?She was last seen for hypertension 6 weeks ago.  ?BP at that visit was 173/64. Management since that visit includes restart enalapril. ? ?She reports excellent compliance with treatment. ?She is not having side effects.  ?She is following a Regular diet. ?She is exercising. ?She does not smoke. ? ?Use of agents associated with hypertension: none.  ? ?Outside blood pressures are stable. ?Symptoms: ?No chest pain No chest pressure  ?No palpitations No syncope  ?No dyspnea No orthopnea  ?No paroxysmal nocturnal dyspnea No lower extremity edema  ? ?Pertinent labs: ?Lab Results  ?Component Value Date  ? CHOL 199 09/02/2019  ? HDL 35 (L) 09/02/2019  ? [place] 150 (H) 09/02/2019  ? TRIG 79 09/02/2019  ? CHOLHDL 5.7 (H) 09/02/2019  ? Lab Results  ?Component Value Date  ? NA 139 02/06/2020  ? K 3.7 02/06/2020  ? CREATININE 0.66 02/06/2020  ? GFRNONAA >60 02/06/2020  ? GLUCOSE 88 02/06/2020  ? TSH 0.688 02/07/2020  ?  ? ?The ASCVD Risk score (Arnett DK, et al., 2019) failed to calculate for the following reasons: ?  The 2019 ASCVD risk score is only valid for ages 34 to 44   ? ?--------------------------------------------------------------------------------------------------- ?Depression, Follow-up ? ?She  was last seen for this 6 weeks ago. ?Changes made at last visit include resume Zoloft '50mg'$  daily. ?  ?She reports excellent compliance with treatment. ?She is not having side effects.  ? ?She reports excellent tolerance of treatment. ?Current symptoms include: fatigue, feelings of worthlessness/guilt, and insomnia ?She feels she is Unchanged since last visit. ? ? ?  11/15/2021  ? 10:17 AM 12/02/2020  ?  1:50 PM 09/03/2020  ?  9:24 AM  ?Depression screen PHQ 2/9  ?Decreased Interest 0 0 0  ?Down, Depressed, Hopeless '1 1 1  '$ ?PHQ - 2 Score '1 1 1  '$ ?Altered sleeping 1 0   ?Tired, decreased energy 1 1   ?Change in appetite 0 0   ?Feeling bad or failure about yourself  1 0   ?Trouble concentrating 1 1   ?Moving slowly or fidgety/restless 0 0   ?Suicidal thoughts 0 0   ?PHQ-9 Score 5 3   ?Difficult doing work/chores Not difficult at all Not difficult at all   ?  ?----------------------------------------------------------------------------------------- ? ? ?Medications: ?Outpatient Medications Prior to Visit  ?Medication Sig  ? enalapril (VASOTEC) 5 MG tablet Take 1 tablet (5 mg total) by mouth daily.  ? ibuprofen (ADVIL) 200 MG tablet Take 200 mg by mouth every 6 (six) hours as needed.  ? sertraline (ZOLOFT) 50 MG tablet Take 1 tablet (50 mg total) by mouth daily.  ? ?No facility-administered medications prior to  visit.  ? ? ?Review of Systems  ?Constitutional:  Positive for activity change and fatigue.  ?Respiratory:  Negative for chest tightness and shortness of breath.   ?Cardiovascular:  Negative for chest pain and palpitations.  ?Gastrointestinal:  Positive for diarrhea.  ? ?  ?  Objective  ?  ?BP 140/75 (BP Location: Right Arm, Patient Position: Sitting, Cuff Size: Large)   Pulse 69   Temp 98.3 ?F (36.8 ?C) (Temporal)   Resp 16   Wt 156 lb (70.8 kg)   BMI 27.63 kg/m?  ?BP Readings  from Last 3 Encounters:  ?11/15/21 140/75  ?10/11/21 (!) 173/64  ?12/02/20 (!) 141/52  ? ?Wt Readings from Last 3 Encounters:  ?11/15/21 156 lb (70.8 kg)  ?10/11/21 156 lb (70.8 kg)  ?12/02/20 154 lb (69.9 kg)  ? ?  ? ?Physical Exam ?Vitals reviewed.  ?Constitutional:   ?   General: She is not in acute distress. ?   Appearance: Normal appearance. She is well-developed. She is not diaphoretic.  ?HENT:  ?   Head: Normocephalic and atraumatic.  ?Eyes:  ?   General: No scleral icterus. ?   Conjunctiva/sclera: Conjunctivae normal.  ?Neck:  ?   Thyroid: No thyromegaly.  ?Cardiovascular:  ?   Rate and Rhythm: Normal rate and regular rhythm.  ?   Pulses: Normal pulses.  ?   Heart sounds: Normal heart sounds. No murmur heard. ?Pulmonary:  ?   Effort: Pulmonary effort is normal. No respiratory distress.  ?   Breath sounds: Normal breath sounds. No wheezing, rhonchi or rales.  ?Musculoskeletal:  ?   Cervical back: Neck supple.  ?   Right lower leg: No edema.  ?   Left lower leg: No edema.  ?Lymphadenopathy:  ?   Cervical: No cervical adenopathy.  ?Skin: ?   General: Skin is warm and dry.  ?   Findings: No rash.  ?Neurological:  ?   Mental Status: She is alert. Mental status is at baseline.  ?Psychiatric:     ?   Mood and Affect: Mood normal.     ?   Behavior: Behavior normal.  ?  ? ? ?No results found for any visits on 11/15/21. ? Assessment & Plan  ?  ? ?Problem List Items Addressed This Visit   ? ?  ? Cardiovascular and Mediastinum  ? Essential hypertension - Primary  ?  Chronic and well-controlled since resuming enalapril ?Continue enalapril at current dose ?Slightly higher BP goal given history of syncope with subdural hematoma ?Recheck metabolic panel ?  ?  ? Relevant Orders  ? Basic Metabolic Panel (BMET)  ?  ? Nervous and Auditory  ? 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  Longstanding and moderate to severe ?Did not tolerate Namenda/Aricept due to GI upset ?Upcoming appointment with neurology ?Advised that she not drive independently ?  ?   ?  ? Other  ? 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  ?  ? MDD (major depressive disorder)  ?  Chronic and fairly well controlled at this time ?Continue Zoloft 50 mg daily ?  ?  ? ? ? ?Return in about 6 months (around 05/18/2022) for chronic disease f/u.  ?   ? ?I, Lavon Paganini, MD, have reviewed all documentation for this visit. The documentation on 11/15/21 for the exam, diagnosis, procedures, and orders are all accurate and complete. ? ? ?  Virginia Crews, MD, MPH ?[facility] ?[facility]  ? ?

## 2021-11-15 ENCOUNTER — Encounter: Payer: Self-pay | Admitting: Family Medicine

## 2021-11-15 ENCOUNTER — Ambulatory Visit (INDEPENDENT_AMBULATORY_CARE_PROVIDER_SITE_OTHER): Payer: Medicare Other | Admitting: Family Medicine

## 2021-11-15 ENCOUNTER — Other Ambulatory Visit: Payer: Self-pay

## 2021-11-15 VITALS — BP 140/75 | HR 69 | Temp 98.3°F | Resp 16 | Wt 156.0 lb

## 2021-11-15 DIAGNOSIS — F03B3 Unspecified dementia, moderate, with mood disturbance: Secondary | ICD-10-CM

## 2021-11-15 DIAGNOSIS — R197 Diarrhea, unspecified: Secondary | ICD-10-CM | POA: Diagnosis not present

## 2021-11-15 DIAGNOSIS — F331 Major depressive disorder, recurrent, moderate: Secondary | ICD-10-CM

## 2021-11-15 DIAGNOSIS — I10 Essential (primary) hypertension: Secondary | ICD-10-CM

## 2021-11-15 NOTE — Assessment & Plan Note (Addendum)
Longstanding and moderate to severe ?Did not tolerate Namenda/Aricept due to GI upset ?Upcoming appointment with neurology ?Advised that she not drive independently ?

## 2021-11-15 NOTE — Assessment & Plan Note (Signed)
Chronic and well-controlled since resuming enalapril ?Continue enalapril at current dose ?Slightly higher BP goal given history of syncope with subdural hematoma ?Recheck metabolic panel ?

## 2021-11-15 NOTE — Assessment & Plan Note (Addendum)
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

## 2021-11-15 NOTE — Assessment & Plan Note (Signed)
Chronic and fairly well controlled at this time ?Continue Zoloft 50 mg daily ?

## 2021-11-17 DIAGNOSIS — I10 Essential (primary) hypertension: Secondary | ICD-10-CM | POA: Diagnosis not present

## 2021-11-18 LAB — BASIC METABOLIC PANEL
BUN/Creatinine Ratio: 17 (ref 12–28)
BUN: 13 mg/dL (ref 8–27)
CO2: 20 mmol/L (ref 20–29)
Calcium: 9.1 mg/dL (ref 8.7–10.3)
Chloride: 106 mmol/L (ref 96–106)
Creatinine, Ser: 0.78 mg/dL (ref 0.57–1.00)
Glucose: 104 mg/dL — ABNORMAL HIGH (ref 70–99)
Potassium: 3.9 mmol/L (ref 3.5–5.2)
Sodium: 140 mmol/L (ref 134–144)
eGFR: 76 mL/min/{1.73_m2} (ref >59)

## 2021-12-17 DIAGNOSIS — Z8679 Personal history of other diseases of the circulatory system: Secondary | ICD-10-CM | POA: Diagnosis not present

## 2021-12-17 DIAGNOSIS — G301 Alzheimer's disease with late onset: Secondary | ICD-10-CM | POA: Diagnosis not present

## 2021-12-17 DIAGNOSIS — F02B18 Dementia in other diseases classified elsewhere, moderate, with other behavioral disturbance: Secondary | ICD-10-CM | POA: Diagnosis not present

## 2021-12-17 DIAGNOSIS — S065XAA Traumatic subdural hemorrhage with loss of consciousness status unknown, initial encounter: Secondary | ICD-10-CM | POA: Diagnosis not present

## 2021-12-17 DIAGNOSIS — R569 Unspecified convulsions: Secondary | ICD-10-CM | POA: Diagnosis not present

## 2021-12-17 DIAGNOSIS — G9389 Other specified disorders of brain: Secondary | ICD-10-CM | POA: Diagnosis not present

## 2021-12-21 ENCOUNTER — Other Ambulatory Visit: Payer: Self-pay | Admitting: Neurology

## 2021-12-21 DIAGNOSIS — G9389 Other specified disorders of brain: Secondary | ICD-10-CM

## 2022-01-03 ENCOUNTER — Ambulatory Visit
Admission: RE | Admit: 2022-01-03 | Discharge: 2022-01-03 | Disposition: A | Discharge status: Home / Self Care | Payer: Medicare Other | Source: Ambulatory Visit | Attending: Neurology | Admitting: Neurology

## 2022-01-03 DIAGNOSIS — G9389 Other specified disorders of brain: Secondary | ICD-10-CM

## 2022-01-03 DIAGNOSIS — G319 Degenerative disease of nervous system, unspecified: Secondary | ICD-10-CM | POA: Diagnosis not present

## 2022-01-13 DIAGNOSIS — G301 Alzheimer's disease with late onset: Secondary | ICD-10-CM | POA: Diagnosis not present

## 2022-01-13 DIAGNOSIS — F02B18 Dementia in other diseases classified elsewhere, moderate, with other behavioral disturbance: Secondary | ICD-10-CM | POA: Diagnosis not present

## 2022-01-13 DIAGNOSIS — R569 Unspecified convulsions: Secondary | ICD-10-CM | POA: Diagnosis not present

## 2022-01-28 ENCOUNTER — Other Ambulatory Visit: Payer: Self-pay | Admitting: Family Medicine

## 2022-01-28 DIAGNOSIS — F339 Major depressive disorder, recurrent, unspecified: Secondary | ICD-10-CM

## 2022-02-13 IMAGING — CT CT CERVICAL SPINE W/O CM
3 of 4 series · 9 of 33 positions shown, 11 images · non-contrast
Comparison: Brain MRI 07/25/2019.
COMPARISON: Brain MRI 07/25/2019.

Addendum:
CLINICAL DATA: Patient status post fall at home today. Initial
encounter.

EXAM:
CT HEAD WITHOUT CONTRAST
CT CERVICAL SPINE WITHOUT CONTRAST
TECHNIQUE: Multidetector CT imaging of the head and cervical spine was
performed following the standard protocol without intravenous
contrast. Multiplanar CT image reconstructions of the cervical spine
were also generated.

[Series 6: sagittal bone · sagittal · 0.25mm/px · 5 of 51 slices shown, 6 images]
[im 17/51  bone]
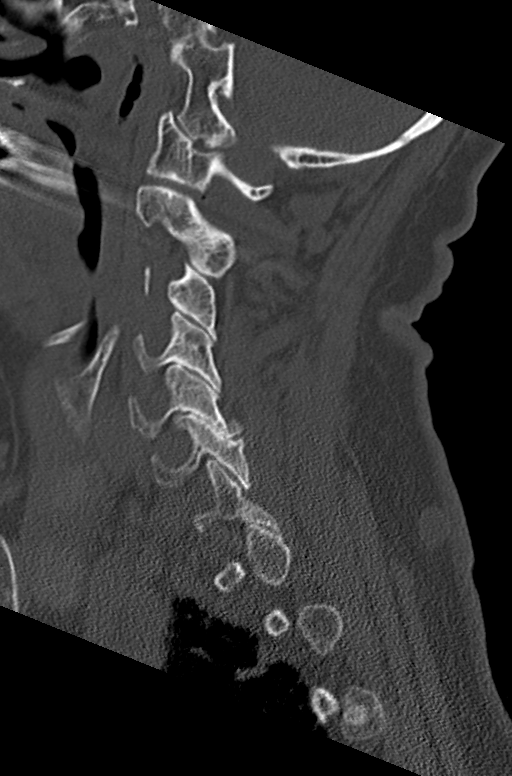
[im 21/51  bone]
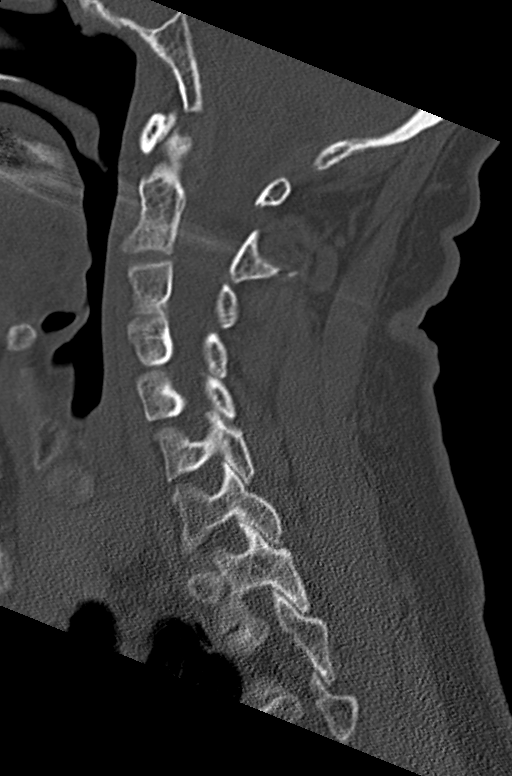
[im 26/51  soft-tissue]
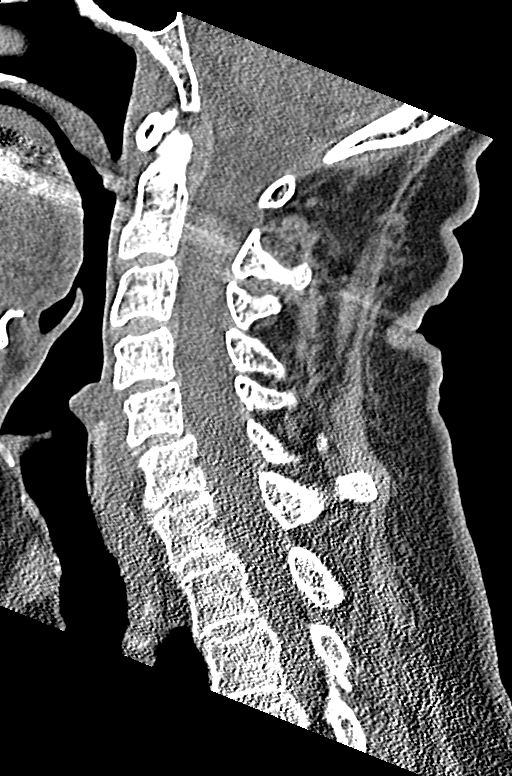
[im 26/51  bone]
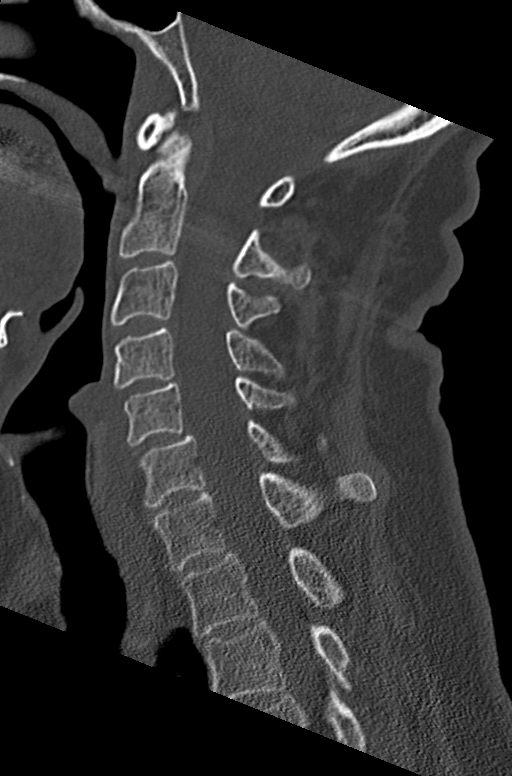
[im 30/51  bone]
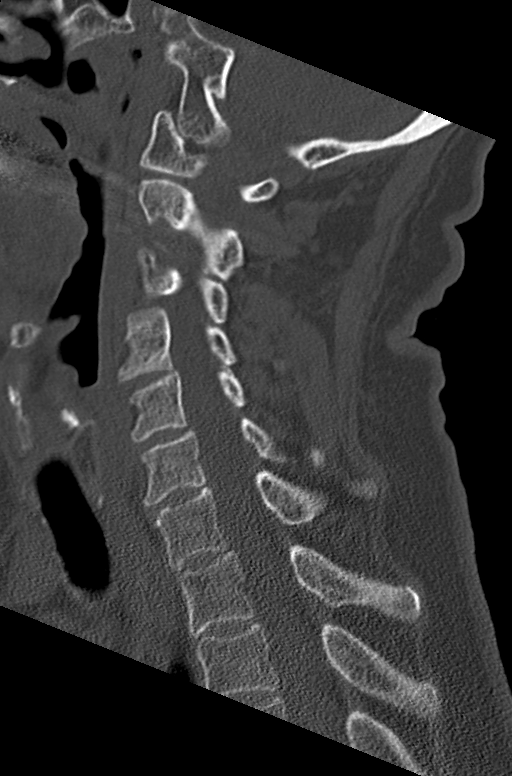
[im 34/51  bone]
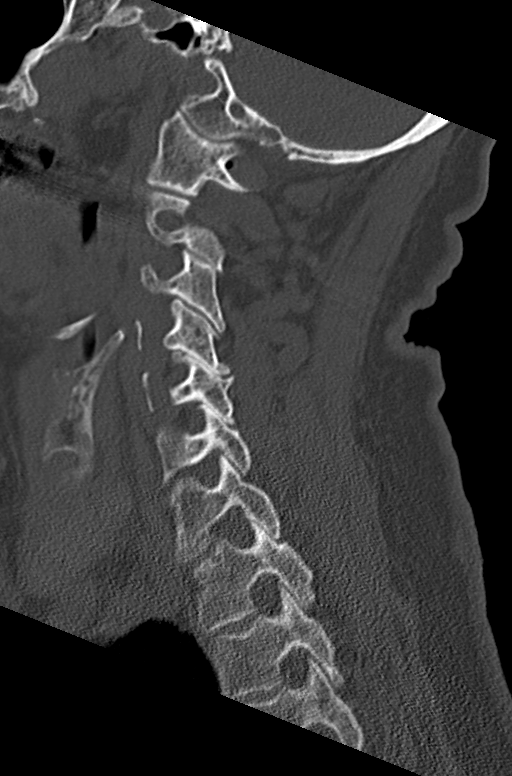

[Series 7: coronal bone · coronal · 0.23mm/px · 3 of 55 slices shown]
[im 11/55  bone]
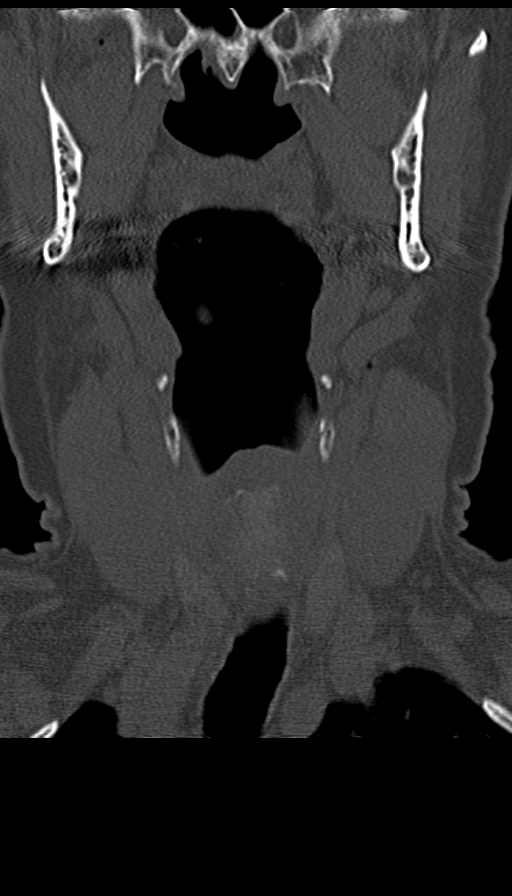
[im 22/55  bone]
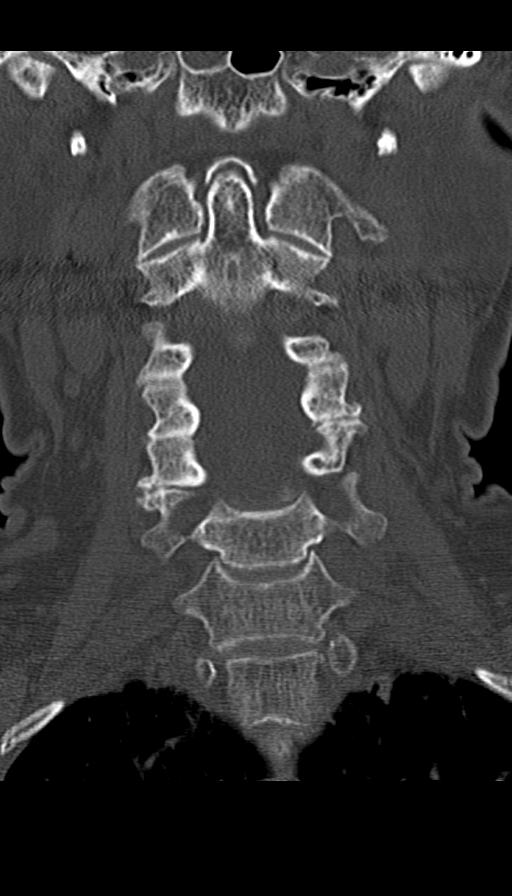
[im 33/55  bone]
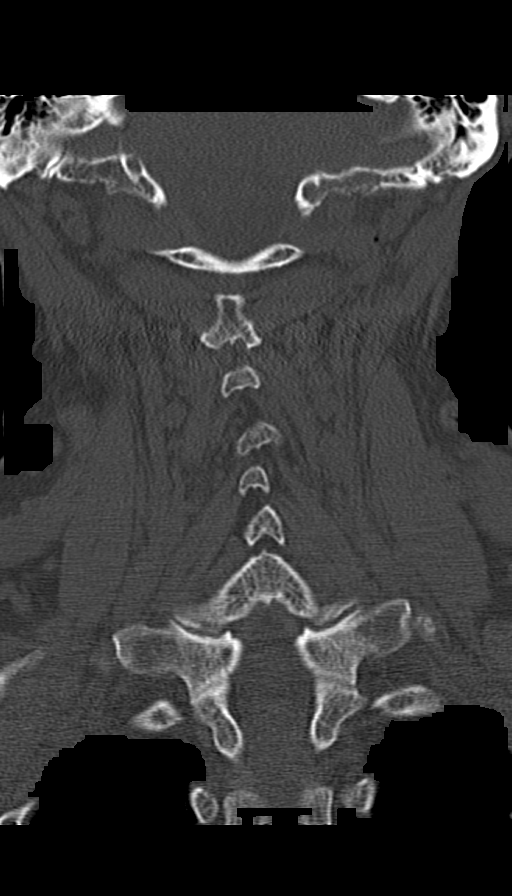

[Series 8: orthogonal bone · axial · 0.23mm/px · z∈[-226,-226]mm · 1 of 97 slices shown, 2 images]
[im 55/97  soft-tissue]
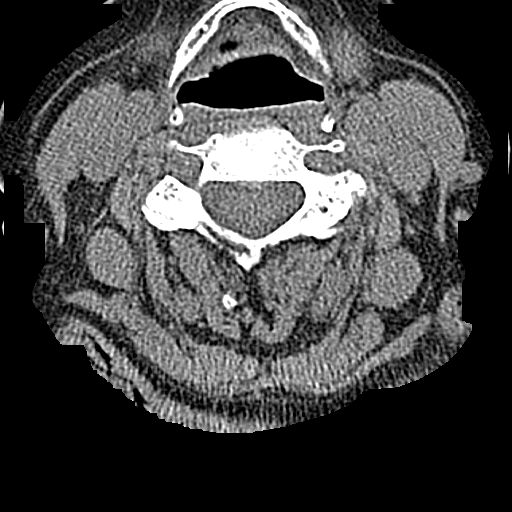
[im 55/97  bone]
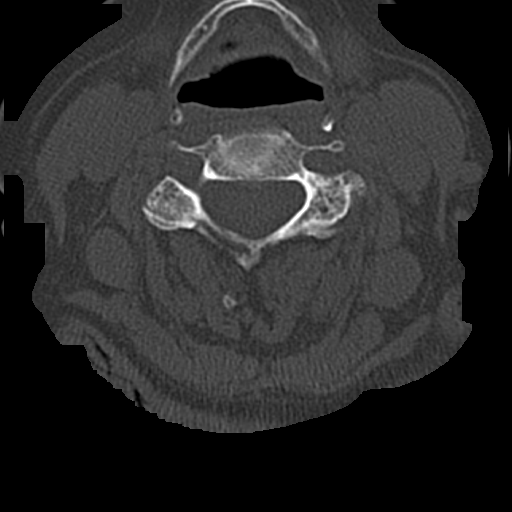

[9 of 33 positions shown; findings below may reference images not displayed]

FINDINGS: CT HEAD FINDINGS

Brain: The patient has a subdural hemorrhage which is predominantly
high attenuating but does have a small amount of low attenuation.
The collection measures up to 1.5 cm in thickness and causes right
to left midline shift of 1.2 cm. Also seen is a small amount of
subdural hemorrhage along the right side of the anterior falx. A
focal parenchymal hematoma in the inferior left frontal lobe
measures 1.3 cm in diameter. No evidence of acute infarct. No
hydrocephalus or pneumocephalus. The left temporal tip is nearly
effaced.

Vascular: No hyperdense vessel or unexpected calcification.

Skull: Intact. No focal lesion. Advanced bilateral TMJ degenerative
disease is noted.

Sinuses/Orbits: There is hemorrhage in the right sphenoid sinus but
no skull base fracture is identified.

Other: None.

CT CERVICAL SPINE FINDINGS

Alignment: Trace anterolisthesis C5 on C6 due to facet arthropathy.

Skull base and vertebrae: No acute fracture. No primary bone lesion
or focal pathologic process.

Soft tissues and spinal canal: No prevertebral fluid or swelling. No
visible canal hematoma.

Disc levels:  Intervertebral disc space height is maintained.

Upper chest: There is some biapical scarring. Lung apices otherwise
clear.

Other: None.
IMPRESSION: Left subdural hematoma is mainly acute with a small chronic
appearing component present. The hematoma causes 1.2 cm of left
right midline shift and effacement of the left temporal tip.

Small amount of subdural blood along the anterior aspect of the falx
on the right. Focal hematoma in the inferior left frontal lobe is
also identified.

Hemorrhage in the right sphenoid worrisome for occult skull base
fracture. Maxillofacial CT scan is recommended for further
evaluation.

No acute abnormality cervical spine.

Critical Value/emergent results were called by telephone at the time
of interpretation on 10/08/2019 at [DATE] to provider CHAPOL YOSUF ,
who verbally acknowledged these results.

ADDENDUM:
This addendum is given as reconstructed images through the sphenoid
sinuses or created for evaluation for possible fracture.

A small focus of pneumocephalus is seen adjacent to the superior
wall of the right sphenoid sinus. No displaced fracture is
identified. 2 very subtle cortical defects are seen in the superior
and superolateral walls of the sphenoid sinus which may be due to
fractures.

*** End of Addendum ***
FINDINGS: CT HEAD FINDINGS

Brain: The patient has a subdural hemorrhage which is predominantly
high attenuating but does have a small amount of low attenuation.
The collection measures up to 1.5 cm in thickness and causes right
to left midline shift of 1.2 cm. Also seen is a small amount of
subdural hemorrhage along the right side of the anterior falx. A
focal parenchymal hematoma in the inferior left frontal lobe
measures 1.3 cm in diameter. No evidence of acute infarct. No
hydrocephalus or pneumocephalus. The left temporal tip is nearly
effaced.

Vascular: No hyperdense vessel or unexpected calcification.

Skull: Intact. No focal lesion. Advanced bilateral TMJ degenerative
disease is noted.

Sinuses/Orbits: There is hemorrhage in the right sphenoid sinus but
no skull base fracture is identified.

Other: None.

CT CERVICAL SPINE FINDINGS

Alignment: Trace anterolisthesis C5 on C6 due to facet arthropathy.

Skull base and vertebrae: No acute fracture. No primary bone lesion
or focal pathologic process.

Soft tissues and spinal canal: No prevertebral fluid or swelling. No
visible canal hematoma.

Disc levels:  Intervertebral disc space height is maintained.

Upper chest: There is some biapical scarring. Lung apices otherwise
clear.

Other: None.
IMPRESSION: Left subdural hematoma is mainly acute with a small chronic
appearing component present. The hematoma causes 1.2 cm of left
right midline shift and effacement of the left temporal tip.

Small amount of subdural blood along the anterior aspect of the falx
on the right. Focal hematoma in the inferior left frontal lobe is
also identified.

Hemorrhage in the right sphenoid worrisome for occult skull base
fracture. Maxillofacial CT scan is recommended for further
evaluation.

No acute abnormality cervical spine.

Critical Value/emergent results were called by telephone at the time
of interpretation on 10/08/2019 at [DATE] to provider CHAPOL YOSUF ,
who verbally acknowledged these results.

## 2022-03-18 DIAGNOSIS — S065XAA Traumatic subdural hemorrhage with loss of consciousness status unknown, initial encounter: Secondary | ICD-10-CM | POA: Diagnosis not present

## 2022-03-18 DIAGNOSIS — G9389 Other specified disorders of brain: Secondary | ICD-10-CM | POA: Diagnosis not present

## 2022-03-18 DIAGNOSIS — G301 Alzheimer's disease with late onset: Secondary | ICD-10-CM | POA: Diagnosis not present

## 2022-03-18 DIAGNOSIS — F02B18 Dementia in other diseases classified elsewhere, moderate, with other behavioral disturbance: Secondary | ICD-10-CM | POA: Diagnosis not present

## 2022-03-18 DIAGNOSIS — R569 Unspecified convulsions: Secondary | ICD-10-CM | POA: Diagnosis not present

## 2022-03-18 DIAGNOSIS — Z8679 Personal history of other diseases of the circulatory system: Secondary | ICD-10-CM | POA: Diagnosis not present

## 2022-05-20 ENCOUNTER — Ambulatory Visit: Payer: Medicare Other | Admitting: Family Medicine

## 2022-05-27 ENCOUNTER — Ambulatory Visit: Payer: Medicare Other | Admitting: Family Medicine

## 2022-06-14 ENCOUNTER — Ambulatory Visit: Payer: Medicare Other | Admitting: Dermatology

## 2022-06-14 DIAGNOSIS — L820 Inflamed seborrheic keratosis: Secondary | ICD-10-CM

## 2022-06-14 DIAGNOSIS — D485 Neoplasm of uncertain behavior of skin: Secondary | ICD-10-CM | POA: Diagnosis not present

## 2022-06-14 DIAGNOSIS — D492 Neoplasm of unspecified behavior of bone, soft tissue, and skin: Secondary | ICD-10-CM

## 2022-06-14 NOTE — Progress Notes (Unsigned)
   Follow-Up Visit   Subjective  Elaine [name] is a 81 y.o. female who presents for the following: Skin Problem (The patient has spots under her right arm to be evaluated, some may be new or changing and the patient has concerns that these could be cancer. ).  Husband with patient   The following portions of the chart were reviewed this encounter and updated as appropriate:   Tobacco  Allergies  Meds  Problems  Med Hx  Surg Hx  Fam Hx      Review of Systems:  No other skin or systemic complaints except as noted in HPI or Assessment and Plan.  Objective  Well appearing patient in no apparent distress; mood and affect are within normal limits.  A focused examination was performed including right axilla. Relevant physical exam findings are noted in the Assessment and Plan.  right axiila 1.4 cm crusted papule        right axilla x 5 Stuck-on, waxy, tan-brown papules and plaques -- Discussed benign etiology and prognosis.     Assessment & Plan  Neoplasm of skin right axiila  Epidermal / dermal shaving  Lesion diameter (cm):  1.4 Informed consent: discussed and consent obtained   Timeout: patient name, date of birth, surgical site, and procedure verified   Procedure prep:  Patient was prepped and draped in usual sterile fashion Prep type:  Isopropyl alcohol Anesthesia: the lesion was anesthetized in a standard fashion   Anesthetic:  1% lidocaine w/ epinephrine 1-100,000 buffered w/ 8.4% NaHCO3 Hemostasis achieved with: pressure, aluminum chloride and electrodesiccation   Outcome: patient tolerated procedure well   Post-procedure details: sterile dressing applied and wound care instructions given   Dressing type: bandage and petrolatum    Specimen 1 - Surgical pathology Differential Diagnosis: R/O Irritated Seborrheic keratosis   Check Margins: No  Reviewed expected wound, scar, risk of recurrence, low risk of infection  Inflamed seborrheic  keratosis right axilla x 5  Symptomatic, irritating, patient would like treated.  Reviewed expected wound, possible scar, risk of infection Pt preferred ED&C to cryotherapy  Destruction of lesion - right axilla x 5  Destruction method: electrodesiccation and curettage   Informed consent: discussed and consent obtained   Timeout:  patient name, date of birth, surgical site, and procedure verified Anesthesia: the lesion was anesthetized in a standard fashion   Anesthetic:  1% lidocaine w/ epinephrine 1-100,000 buffered w/ 8.4% NaHCO3 Curettage performed in three different directions: Yes   Electrodesiccation performed over the curetted area: Yes   Curettage cycles:  3 Hemostasis achieved with:  electrodesiccation Outcome: patient tolerated procedure well with no complications   Post-procedure details: sterile dressing applied and wound care instructions given   Dressing type: petrolatum     Return in about 2 months (around 08/14/2022) for recheck ISKs .  I, Marye Round, CMA, am acting as scribe for Forest Gleason, MD .   Documentation: I have reviewed the above documentation for accuracy and completeness, and I agree with the above.  Forest Gleason, MD

## 2022-06-14 NOTE — Patient Instructions (Addendum)
Wound Care Instructions  Cleanse wound gently with soap and water once a day then pat dry with clean gauze. Apply a thin coat of Petrolatum (petroleum jelly, "Vaseline") over the wound (unless you have an allergy to this). We recommend that you use a new, sterile tube of Vaseline. Do not pick or remove scabs. Do not remove the yellow or white "healing tissue" from the base of the wound.  Cover the wound with fresh, clean, nonstick gauze and secure with paper tape. You may use Band-Aids in place of gauze and tape if the wound is small enough, but would recommend trimming much of the tape off as there is often too much. Sometimes Band-Aids can irritate the skin.  You should call the office for your biopsy report after 1 week if you have not already been contacted.  If you experience any problems, such as abnormal amounts of bleeding, swelling, significant bruising, significant pain, or evidence of infection, please call the office immediately.  FOR ADULT SURGERY PATIENTS: If you need something for pain relief you may take 1 extra strength Tylenol (acetaminophen) AND 2 Ibuprofen (200mg each) together every 4 hours as needed for pain. (do not take these if you are allergic to them or if you have a reason you should not take them.) Typically, you may only need pain medication for 1 to 3 days.     Due to recent changes in healthcare laws, you may see results of your pathology and/or laboratory studies on MyChart before the doctors have had a chance to review them. We understand that in some cases there may be results that are confusing or concerning to you. Please understand that not all results are received at the same time and often the doctors may need to interpret multiple results in order to provide you with the best plan of care or course of treatment. Therefore, we ask that you please give us 2 business days to thoroughly review all your results before contacting the office for clarification. Should  we see a critical lab result, you will be contacted sooner.   If You Need Anything After Your Visit  If you have any questions or concerns for your doctor, please call our main line at [phone] and press option 4 to reach your doctor's medical assistant. If no one answers, please leave a voicemail as directed and we will return your call as soon as possible. Messages left after 4 pm will be answered the following business day.   You may also send us a message via MyChart. We typically respond to MyChart messages within 1-2 business days.  For prescription refills, please ask your pharmacy to contact our office. Our fax number is [phone].  If you have an urgent issue when the clinic is closed that cannot wait until the next business day, you can page your doctor at the number below.    Please note that while we do our best to be available for urgent issues outside of office hours, we are not available 24/7.   If you have an urgent issue and are unable to reach us, you may choose to seek medical care at your doctor's office, retail clinic, urgent care center, or emergency room.  If you have a medical emergency, please immediately call 911 or go to the emergency department.  Pager Numbers  - Dr. Kowalski: [phone]  - Dr. Moye: [phone]  - Dr. Stewart: [phone]  In the event of inclement weather, please call our main line at   [phone] for an update on the status of any delays or closures.  Dermatology Medication Tips: Please keep the boxes that topical medications come in in order to help keep track of the instructions about where and how to use these. Pharmacies typically print the medication instructions only on the boxes and not directly on the medication tubes.   If your medication is too expensive, please contact our office at [phone] option 4 or send us a message through MyChart.   We are unable to tell what your co-pay for medications will be in  advance as this is different depending on your insurance coverage. However, we may be able to find a substitute medication at lower cost or fill out paperwork to get insurance to cover a needed medication.   If a prior authorization is required to get your medication covered by your insurance company, please allow us 1-2 business days to complete this process.  Drug prices often vary depending on where the prescription is filled and some pharmacies may offer cheaper prices.  The website www.goodrx.com contains coupons for medications through different pharmacies. The prices here do not account for what the cost may be with help from insurance (it may be cheaper with your insurance), but the website can give you the price if you did not use any insurance.  - You can print the associated coupon and take it with your prescription to the pharmacy.  - You may also stop by our office during regular business hours and pick up a GoodRx coupon card.  - If you need your prescription sent electronically to a different pharmacy, notify our office through [facility] MyChart or by phone at [phone] option 4.     Si Usted Necesita Algo Despus de Su Visita  Tambin puede enviarnos un mensaje a travs de MyChart. Por lo general respondemos a los mensajes de MyChart en el transcurso de 1 a 2 das hbiles.  Para renovar recetas, por favor pida a su farmacia que se ponga en contacto con nuestra oficina. Nuestro nmero de fax es el [phone].  Si tiene un asunto urgente cuando la clnica est cerrada y que no puede esperar hasta el siguiente da hbil, puede llamar/localizar a su doctor(a) al nmero que aparece a continuacin.   Por favor, tenga en cuenta que aunque hacemos todo lo posible para estar disponibles para asuntos urgentes fuera del horario de oficina, no estamos disponibles las 24 horas del da, los 7 das de la semana.   Si tiene un problema urgente y no puede comunicarse con nosotros, puede  optar por buscar atencin mdica  en el consultorio de su doctor(a), en una clnica privada, en un centro de atencin urgente o en una sala de emergencias.  Si tiene una emergencia mdica, por favor llame inmediatamente al 911 o vaya a la sala de emergencias.  Nmeros de bper  - Dr. Kowalski: [phone]  - Dra. Moye: [phone]  - Dra. Stewart: [phone]  En caso de inclemencias del tiempo, por favor llame a nuestra lnea principal al [phone] para una actualizacin sobre el estado de cualquier retraso o cierre.  Consejos para la medicacin en dermatologa: Por favor, guarde las cajas en las que vienen los medicamentos de uso tpico para ayudarle a seguir las instrucciones sobre dnde y cmo usarlos. Las farmacias generalmente imprimen las instrucciones del medicamento slo en las cajas y no directamente en los tubos del medicamento.   Si su medicamento es muy caro, por favor, pngase en contacto con   nuestra oficina llamando al [phone] y presione la opcin 4 o envenos un mensaje a travs de MyChart.   No podemos decirle cul ser su copago por los medicamentos por adelantado ya que esto es diferente dependiendo de la cobertura de su seguro. Sin embargo, es posible que podamos encontrar un medicamento sustituto a menor costo o llenar un formulario para que el seguro cubra el medicamento que se considera necesario.   Si se requiere una autorizacin previa para que su compaa de seguros cubra su medicamento, por favor permtanos de 1 a 2 das hbiles para completar este proceso.  Los precios de los medicamentos varan con frecuencia dependiendo del lugar de dnde se surte la receta y alguna farmacias pueden ofrecer precios ms baratos.  El sitio web www.goodrx.com tiene cupones para medicamentos de diferentes farmacias. Los precios aqu no tienen en cuenta lo que podra costar con la ayuda del seguro (puede ser ms barato con su seguro), pero el sitio web puede darle el  precio si no utiliz ningn seguro.  - Puede imprimir el cupn correspondiente y llevarlo con su receta a la farmacia.  - Tambin puede pasar por nuestra oficina durante el horario de atencin regular y recoger una tarjeta de cupones de GoodRx.  - Si necesita que su receta se enve electrnicamente a una farmacia diferente, informe a nuestra oficina a travs de MyChart de [facility] o por telfono llamando al [phone] y presione la opcin 4.  

## 2022-06-15 ENCOUNTER — Encounter: Payer: Self-pay | Admitting: Dermatology

## 2022-06-16 ENCOUNTER — Telehealth: Payer: Self-pay | Admitting: Family Medicine

## 2022-06-16 NOTE — Telephone Encounter (Signed)
Pt's daughter Sharee Pimple called saying she would like for Dr. Edythe Clarity or her nurse to call her back before mothers appt. On the 30th.  Along with her dementia there have been other changes she would like to discuss before the visit.  CB#  [phone]
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
## 2022-06-17 DIAGNOSIS — Z8679 Personal history of other diseases of the circulatory system: Secondary | ICD-10-CM | POA: Diagnosis not present

## 2022-06-17 DIAGNOSIS — F02B18 Dementia in other diseases classified elsewhere, moderate, with other behavioral disturbance: Secondary | ICD-10-CM | POA: Diagnosis not present

## 2022-06-17 DIAGNOSIS — G9389 Other specified disorders of brain: Secondary | ICD-10-CM | POA: Diagnosis not present

## 2022-06-17 DIAGNOSIS — R569 Unspecified convulsions: Secondary | ICD-10-CM | POA: Diagnosis not present

## 2022-06-17 DIAGNOSIS — G301 Alzheimer's disease with late onset: Secondary | ICD-10-CM | POA: Diagnosis not present

## 2022-06-17 NOTE — Progress Notes (Unsigned)
      Established patient visit   Patient: Elaine [name]   DOB: 08-Aug-1941   81 y.o. Female  MRN: [id number] Visit Date: 06/20/2022  Today's healthcare provider: Lavon Paganini, MD   No chief complaint on file.  Subjective    HPI  Hypertension, follow-up  BP Readings from Last 3 Encounters:  11/15/21 140/75  10/11/21 (!) 173/64  12/02/20 (!) 141/52   Wt Readings from Last 3 Encounters:  11/15/21 156 lb (70.8 kg)  10/11/21 156 lb (70.8 kg)  12/02/20 154 lb (69.9 kg)     She was last seen for hypertension 7 months ago.  BP at that visit was 140/75. Management since that visit includes Continue enalapril at current dose.  She reports {excellent/good/fair/poor:19665} compliance with treatment. She {is/is not:9024} having side effects. {document side effects if present:1}   Outside blood pressures are {***enter patient reported home BP readings, or 'not being checked':1}. Symptoms: {Yes/No:20286} chest pain {Yes/No:20286} chest pressure  {Yes/No:20286} palpitations {Yes/No:20286} syncope  {Yes/No:20286} dyspnea {Yes/No:20286} orthopnea  {Yes/No:20286} paroxysmal nocturnal dyspnea {Yes/No:20286} lower extremity edema   Pertinent labs Lab Results  Component Value Date   CHOL 199 09/02/2019   HDL 35 (L) 09/02/2019   LDLCALC 150 (H) 09/02/2019   TRIG 79 09/02/2019   CHOLHDL 5.7 (H) 09/02/2019   Lab Results  Component Value Date   NA 140 11/17/2021   K 3.9 11/17/2021   CREATININE 0.78 11/17/2021   EGFR 76 11/17/2021   GLUCOSE 104 (H) 11/17/2021   TSH 0.688 02/07/2020     ---------------------------------------------------------------------------------------------------  Follow up for Moderate late onset Alzheimer's dementia with   The patient was last seen for this 7 months ago. Changes made at last visit include followed by Neurology.   She reports {excellent/good/fair/poor:19665} compliance with treatment.  She {is/is not:21021397} having side effects.  *** She feels that condition is Worse. Family concerns of patient's behavior   -----------------------------------------------------------------------------------------  Medications: Outpatient Medications Prior to Visit  Medication Sig   enalapril (VASOTEC) 5 MG tablet TAKE 1 TABLET (5 MG TOTAL) BY MOUTH DAILY.   ibuprofen (ADVIL) 200 MG tablet Take 200 mg by mouth every 6 (six) hours as needed.   sertraline (ZOLOFT) 50 MG tablet TAKE 1 TABLET BY MOUTH EVERY DAY   No facility-administered medications prior to visit.    Review of Systems  {Labs  Heme  Chem  Endocrine  Serology  Results Review (optional):23779}   Objective    There were no vitals taken for this visit. {Show previous vital signs (optional):23777}  Physical Exam  ***  No results found for any visits on 06/20/22.  Assessment & Plan     ***  No follow-ups on file.      {provider attestation***:1}   Lavon Paganini, MD  [facility] [phone] (phone) [phone] (fax)  [place]

## 2022-06-20 ENCOUNTER — Encounter: Payer: Self-pay | Admitting: Family Medicine

## 2022-06-20 ENCOUNTER — Ambulatory Visit (INDEPENDENT_AMBULATORY_CARE_PROVIDER_SITE_OTHER): Payer: Medicare Other | Admitting: Family Medicine

## 2022-06-20 VITALS — BP 106/63 | HR 71 | Temp 98.3°F | Resp 16 | Wt 150.0 lb

## 2022-06-20 DIAGNOSIS — I10 Essential (primary) hypertension: Secondary | ICD-10-CM

## 2022-06-20 DIAGNOSIS — F03B3 Unspecified dementia, moderate, with mood disturbance: Secondary | ICD-10-CM

## 2022-06-20 DIAGNOSIS — R058 Other specified cough: Secondary | ICD-10-CM | POA: Diagnosis not present

## 2022-06-20 DIAGNOSIS — R197 Diarrhea, unspecified: Secondary | ICD-10-CM

## 2022-06-20 DIAGNOSIS — F331 Major depressive disorder, recurrent, moderate: Secondary | ICD-10-CM

## 2022-06-20 NOTE — Assessment & Plan Note (Signed)
Longstanding and moderate Did not tolerate Namenda/Aricept due to GI upset Saw neurology last week and discussed another possible option, but she is not taking this currently Family has concerns over worsening behavior and difficulty with medication and adherence Gave handout on strategies and discussed strategies today for mitigating arguments At her appointment with neurology last week, her Lamictal dose was changed from 50 mg twice daily to 100 mg XR daily We will move her to other medications-enalapril and Zoloft- to bedtime dosing to go with the Lamictal to minimize her pill burden and number of times she is taking medications to 1

## 2022-06-20 NOTE — Telephone Encounter (Signed)
Will discuss at appt today.  

## 2022-06-20 NOTE — Assessment & Plan Note (Signed)
Well controlled Continue current medications Reviewed last metabolic panel -agreed to check annually

## 2022-06-20 NOTE — Assessment & Plan Note (Signed)
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

## 2022-06-20 NOTE — Assessment & Plan Note (Signed)
New problem for a few weeks after recent viral URI which was likely COVID given that her partner that she lives with tested positive for COVID at the same time Lungs are clear today without signs of CAP or other complication Discussed natural course, watchful waiting, honey as needed

## 2022-06-20 NOTE — Assessment & Plan Note (Signed)
Chronic and well controlled Continue Zoloft at current dose 

## 2022-06-21 ENCOUNTER — Telehealth: Payer: Self-pay

## 2022-06-21 NOTE — Telephone Encounter (Signed)
-----   Message from [place], MD sent at 06/20/2022 11:07 PM EDT ----- Skin (M), right axilla SEBORRHEIC KERATOSIS, IRRITATED "irritated wisdom spot, no cancer seen, no additional treatment needed"  MAs please call. Thank you!

## 2022-06-21 NOTE — Telephone Encounter (Signed)
Called both numbers in patient's chart. N/A. Unable to leave message.

## 2022-06-25 ENCOUNTER — Other Ambulatory Visit: Payer: Self-pay | Admitting: Family Medicine

## 2022-06-25 DIAGNOSIS — F339 Major depressive disorder, recurrent, unspecified: Secondary | ICD-10-CM

## 2022-06-27 NOTE — Telephone Encounter (Signed)
Requested medication (s) are due for refill today: yes  Requested medication (s) are on the active medication list: yes  Last refill:  both last RF 01/28/22 #90  Future visit scheduled: no  Notes to clinic:  overdue lab work   Requested Prescriptions  Pending Prescriptions Disp Refills   sertraline (ZOLOFT) 50 MG tablet [Pharmacy Med Name: SERTRALINE HCL 50 MG TABLET] 90 tablet 0    Sig: TAKE 1 TABLET BY [employer]     Psychiatry:  Antidepressants - SSRI - sertraline Failed - 06/25/2022  1:13 AM      Failed - AST in normal range and within 360 days    AST  Date Value Ref Range Status  10/08/2019 39 15 - 41 U/L Final         Failed - ALT in normal range and within 360 days    ALT  Date Value Ref Range Status  10/08/2019 19 0 - 44 U/L Final         Passed - Completed PHQ-2 or PHQ-9 in the last 360 days      Passed - Valid encounter within last 6 months    Recent Outpatient Visits           1 week ago Essential hypertension   [place], Dionne Bucy, MD   7 months ago Essential hypertension   [place], Dionne Bucy, MD   8 months ago Encounter for annual wellness visit (AWV) in Medicare patient   [facility] [place], Dionne Bucy, MD   1 year ago Generalized anxiety disorder   [place], Laurita Quint, FNP   1 year ago Encounter for annual physical exam   [facility] [place], Dionne Bucy, MD       Future Appointments             In 1 month [place], [place], MD [place]             enalapril (VASOTEC) 5 MG tablet [Pharmacy Med Name: ENALAPRIL MALEATE 5 MG TABLET] 90 tablet 0    Sig: TAKE 1 TABLET (5 MG TOTAL) BY MOUTH DAILY.     Cardiovascular:  ACE Inhibitors Failed - 06/25/2022  1:13 AM      Failed - Cr in normal range and within 180 days    Creatinine, Ser  Date Value Ref Range Status  11/17/2021 0.78 0.57 - 1.00 mg/dL Final         Failed - K  in normal range and within 180 days    Potassium  Date Value Ref Range Status  11/17/2021 3.9 3.5 - 5.2 mmol/L Final         Passed - Patient is not pregnant      Passed - Last BP in normal range    BP Readings from Last 1 Encounters:  06/20/22 106/63         Passed - Valid encounter within last 6 months    Recent Outpatient Visits           1 week ago Essential hypertension   [place], Dionne Bucy, MD   7 months ago Essential hypertension   [place], Dionne Bucy, MD   8 months ago Encounter for annual wellness visit (AWV) in Medicare patient   [facility] [place], Dionne Bucy, MD   1 year ago Generalized anxiety disorder   [place], Laurita Quint, FNP  1 year ago Encounter for annual physical exam   [facility] [place], Dionne Bucy, MD       Future Appointments             In 1 month [facility], [place], MD [place]

## 2022-07-07 ENCOUNTER — Telehealth: Payer: Self-pay

## 2022-07-07 NOTE — Telephone Encounter (Signed)
Patient advised pathology benign wisdom spot.  Lurlean Horns., RMA

## 2022-07-07 NOTE — Telephone Encounter (Signed)
-----   Message from [place], MD sent at 06/20/2022 11:07 PM EDT ----- Skin (M), right axilla SEBORRHEIC KERATOSIS, IRRITATED "irritated wisdom spot, no cancer seen, no additional treatment needed"  MAs please call. Thank you!

## 2022-07-18 DIAGNOSIS — Z8679 Personal history of other diseases of the circulatory system: Secondary | ICD-10-CM | POA: Diagnosis not present

## 2022-07-18 DIAGNOSIS — F02B18 Dementia in other diseases classified elsewhere, moderate, with other behavioral disturbance: Secondary | ICD-10-CM | POA: Diagnosis not present

## 2022-07-18 DIAGNOSIS — G9389 Other specified disorders of brain: Secondary | ICD-10-CM | POA: Diagnosis not present

## 2022-07-18 DIAGNOSIS — R569 Unspecified convulsions: Secondary | ICD-10-CM | POA: Diagnosis not present

## 2022-07-18 DIAGNOSIS — G301 Alzheimer's disease with late onset: Secondary | ICD-10-CM | POA: Diagnosis not present

## 2022-08-24 ENCOUNTER — Ambulatory Visit: Payer: Medicare Other | Admitting: Dermatology

## 2022-11-14 ENCOUNTER — Telehealth: Payer: Self-pay | Admitting: Family Medicine

## 2022-11-14 NOTE — Telephone Encounter (Signed)
Called patient to schedule Medicare Annual Wellness Visit (AWV). Left message for patient to call back and schedule Medicare Annual Wellness Visit (AWV).  Last date of AWV: 10/11/2021  Please schedule an appointment at any time with [facility] .  If any questions, please contact me at [phone].  Thank you ,  [place] Direct Dial: [phone]

## 2022-11-14 NOTE — Telephone Encounter (Signed)
Contacted Elaine [name] to schedule their annual wellness visit. Appointment made for 01/04/2023.  [place] Direct Dial: [phone]

## 2022-11-18 DIAGNOSIS — S32019A Unspecified fracture of first lumbar vertebra, initial encounter for closed fracture: Secondary | ICD-10-CM | POA: Diagnosis not present

## 2022-11-18 DIAGNOSIS — S3993XA Unspecified injury of pelvis, initial encounter: Secondary | ICD-10-CM | POA: Diagnosis not present

## 2022-11-18 DIAGNOSIS — W19XXXA Unspecified fall, initial encounter: Secondary | ICD-10-CM | POA: Diagnosis not present

## 2022-11-18 DIAGNOSIS — Z79899 Other long term (current) drug therapy: Secondary | ICD-10-CM | POA: Diagnosis not present

## 2022-11-18 DIAGNOSIS — F32A Depression, unspecified: Secondary | ICD-10-CM | POA: Diagnosis not present

## 2022-11-18 DIAGNOSIS — J942 Hemothorax: Secondary | ICD-10-CM | POA: Diagnosis not present

## 2022-11-18 DIAGNOSIS — Z809 Family history of malignant neoplasm, unspecified: Secondary | ICD-10-CM | POA: Diagnosis not present

## 2022-11-18 DIAGNOSIS — S32018A Other fracture of first lumbar vertebra, initial encounter for closed fracture: Secondary | ICD-10-CM | POA: Diagnosis not present

## 2022-11-18 DIAGNOSIS — F03911 Unspecified dementia, unspecified severity, with agitation: Secondary | ICD-10-CM | POA: Diagnosis not present

## 2022-11-18 DIAGNOSIS — S2241XA Multiple fractures of ribs, right side, initial encounter for closed fracture: Secondary | ICD-10-CM | POA: Diagnosis not present

## 2022-11-18 DIAGNOSIS — I351 Nonrheumatic aortic (valve) insufficiency: Secondary | ICD-10-CM | POA: Diagnosis not present

## 2022-11-18 DIAGNOSIS — Z8249 Family history of ischemic heart disease and other diseases of the circulatory system: Secondary | ICD-10-CM | POA: Diagnosis not present

## 2022-11-18 DIAGNOSIS — G9389 Other specified disorders of brain: Secondary | ICD-10-CM | POA: Diagnosis not present

## 2022-11-18 DIAGNOSIS — F0394 Unspecified dementia, unspecified severity, with anxiety: Secondary | ICD-10-CM | POA: Diagnosis not present

## 2022-11-18 DIAGNOSIS — M47812 Spondylosis without myelopathy or radiculopathy, cervical region: Secondary | ICD-10-CM | POA: Diagnosis not present

## 2022-11-18 DIAGNOSIS — S3991XA Unspecified injury of abdomen, initial encounter: Secondary | ICD-10-CM | POA: Diagnosis not present

## 2022-11-18 DIAGNOSIS — M4856XA Collapsed vertebra, not elsewhere classified, lumbar region, initial encounter for fracture: Secondary | ICD-10-CM | POA: Diagnosis not present

## 2022-11-18 DIAGNOSIS — J948 Other specified pleural conditions: Secondary | ICD-10-CM | POA: Diagnosis not present

## 2022-11-18 DIAGNOSIS — J9811 Atelectasis: Secondary | ICD-10-CM | POA: Diagnosis not present

## 2022-11-18 DIAGNOSIS — F0393 Unspecified dementia, unspecified severity, with mood disturbance: Secondary | ICD-10-CM | POA: Diagnosis not present

## 2022-11-18 DIAGNOSIS — S271XXA Traumatic hemothorax, initial encounter: Secondary | ICD-10-CM | POA: Diagnosis not present

## 2022-11-18 DIAGNOSIS — S299XXA Unspecified injury of thorax, initial encounter: Secondary | ICD-10-CM | POA: Diagnosis not present

## 2022-11-18 DIAGNOSIS — S32010A Wedge compression fracture of first lumbar vertebra, initial encounter for closed fracture: Secondary | ICD-10-CM | POA: Diagnosis not present

## 2022-11-18 DIAGNOSIS — S3992XA Unspecified injury of lower back, initial encounter: Secondary | ICD-10-CM | POA: Diagnosis not present

## 2022-11-18 DIAGNOSIS — S27301A Unspecified injury of lung, unilateral, initial encounter: Secondary | ICD-10-CM | POA: Diagnosis not present

## 2022-11-18 DIAGNOSIS — Z743 Need for continuous supervision: Secondary | ICD-10-CM | POA: Diagnosis not present

## 2022-11-18 DIAGNOSIS — R6889 Other general symptoms and signs: Secondary | ICD-10-CM | POA: Diagnosis not present

## 2022-11-18 DIAGNOSIS — M8008XA Age-related osteoporosis with current pathological fracture, vertebra(e), initial encounter for fracture: Secondary | ICD-10-CM | POA: Diagnosis not present

## 2022-11-18 DIAGNOSIS — M1990 Unspecified osteoarthritis, unspecified site: Secondary | ICD-10-CM | POA: Diagnosis not present

## 2022-11-18 DIAGNOSIS — Z9181 History of falling: Secondary | ICD-10-CM | POA: Diagnosis not present

## 2022-11-18 DIAGNOSIS — I10 Essential (primary) hypertension: Secondary | ICD-10-CM | POA: Diagnosis not present

## 2022-11-18 DIAGNOSIS — R55 Syncope and collapse: Secondary | ICD-10-CM | POA: Diagnosis not present

## 2022-11-18 DIAGNOSIS — S199XXA Unspecified injury of neck, initial encounter: Secondary | ICD-10-CM | POA: Diagnosis not present

## 2022-11-18 DIAGNOSIS — E7800 Pure hypercholesterolemia, unspecified: Secondary | ICD-10-CM | POA: Diagnosis not present

## 2022-11-18 DIAGNOSIS — S0990XA Unspecified injury of head, initial encounter: Secondary | ICD-10-CM | POA: Diagnosis not present

## 2022-11-18 DIAGNOSIS — S270XXA Traumatic pneumothorax, initial encounter: Secondary | ICD-10-CM | POA: Diagnosis not present

## 2022-11-18 DIAGNOSIS — J90 Pleural effusion, not elsewhere classified: Secondary | ICD-10-CM | POA: Diagnosis not present

## 2022-11-18 DIAGNOSIS — Z87820 Personal history of traumatic brain injury: Secondary | ICD-10-CM | POA: Diagnosis not present

## 2022-11-20 DIAGNOSIS — Z9181 History of falling: Secondary | ICD-10-CM | POA: Diagnosis not present

## 2022-11-20 DIAGNOSIS — J90 Pleural effusion, not elsewhere classified: Secondary | ICD-10-CM | POA: Diagnosis not present

## 2022-11-20 DIAGNOSIS — M8008XA Age-related osteoporosis with current pathological fracture, vertebra(e), initial encounter for fracture: Secondary | ICD-10-CM | POA: Diagnosis not present

## 2022-11-20 DIAGNOSIS — W19XXXA Unspecified fall, initial encounter: Secondary | ICD-10-CM | POA: Diagnosis not present

## 2022-11-20 DIAGNOSIS — J939 Pneumothorax, unspecified: Secondary | ICD-10-CM | POA: Diagnosis not present

## 2022-11-20 DIAGNOSIS — I351 Nonrheumatic aortic (valve) insufficiency: Secondary | ICD-10-CM | POA: Diagnosis not present

## 2022-11-20 DIAGNOSIS — R9431 Abnormal electrocardiogram [ECG] [EKG]: Secondary | ICD-10-CM | POA: Diagnosis not present

## 2022-11-20 DIAGNOSIS — I4510 Unspecified right bundle-branch block: Secondary | ICD-10-CM | POA: Diagnosis not present

## 2022-11-20 DIAGNOSIS — E7800 Pure hypercholesterolemia, unspecified: Secondary | ICD-10-CM | POA: Diagnosis not present

## 2022-11-20 DIAGNOSIS — Z87820 Personal history of traumatic brain injury: Secondary | ICD-10-CM | POA: Diagnosis not present

## 2022-11-20 DIAGNOSIS — Z809 Family history of malignant neoplasm, unspecified: Secondary | ICD-10-CM | POA: Diagnosis not present

## 2022-11-20 DIAGNOSIS — S27301A Unspecified injury of lung, unilateral, initial encounter: Secondary | ICD-10-CM | POA: Diagnosis not present

## 2022-11-20 DIAGNOSIS — S27309A Unspecified injury of lung, unspecified, initial encounter: Secondary | ICD-10-CM | POA: Diagnosis not present

## 2022-11-20 DIAGNOSIS — S2241XA Multiple fractures of ribs, right side, initial encounter for closed fracture: Secondary | ICD-10-CM | POA: Diagnosis not present

## 2022-11-20 DIAGNOSIS — M1990 Unspecified osteoarthritis, unspecified site: Secondary | ICD-10-CM | POA: Diagnosis not present

## 2022-11-20 DIAGNOSIS — I10 Essential (primary) hypertension: Secondary | ICD-10-CM | POA: Diagnosis not present

## 2022-11-20 DIAGNOSIS — R0902 Hypoxemia: Secondary | ICD-10-CM | POA: Diagnosis not present

## 2022-11-20 DIAGNOSIS — R918 Other nonspecific abnormal finding of lung field: Secondary | ICD-10-CM | POA: Diagnosis not present

## 2022-11-20 DIAGNOSIS — S32018A Other fracture of first lumbar vertebra, initial encounter for closed fracture: Secondary | ICD-10-CM | POA: Diagnosis not present

## 2022-11-20 DIAGNOSIS — Z8249 Family history of ischemic heart disease and other diseases of the circulatory system: Secondary | ICD-10-CM | POA: Diagnosis not present

## 2022-11-20 DIAGNOSIS — S27898A Other injury of other specified intrathoracic organs, initial encounter: Secondary | ICD-10-CM | POA: Diagnosis not present

## 2022-11-20 DIAGNOSIS — G9389 Other specified disorders of brain: Secondary | ICD-10-CM | POA: Diagnosis not present

## 2022-11-20 DIAGNOSIS — J9811 Atelectasis: Secondary | ICD-10-CM | POA: Diagnosis not present

## 2022-11-21 DIAGNOSIS — I4510 Unspecified right bundle-branch block: Secondary | ICD-10-CM | POA: Diagnosis not present

## 2022-11-21 DIAGNOSIS — R9431 Abnormal electrocardiogram [ECG] [EKG]: Secondary | ICD-10-CM | POA: Diagnosis not present

## 2022-11-22 DIAGNOSIS — I4510 Unspecified right bundle-branch block: Secondary | ICD-10-CM | POA: Diagnosis not present

## 2022-11-24 ENCOUNTER — Telehealth: Payer: Self-pay | Admitting: *Deleted

## 2022-11-24 NOTE — Transitions of Care (Post Inpatient/ED Visit) (Signed)
   11/24/2022  Name: Elaine [name] MRN: [id number] DOB: 18-Jan-1941  Today's TOC FU Call Status: Today's TOC FU Call Status:: Unsuccessul Call (1st Attempt) Unsuccessful Call (1st Attempt) Date: 11/24/22  Attempted to reach the patient regarding the most recent Inpatient/ED visit.  Follow Up Plan: Additional outreach attempts will be made to reach the patient to complete the Transitions of Care (Post Inpatient/ED visit) call.   [facility] [phone]

## 2022-11-27 DIAGNOSIS — I10 Essential (primary) hypertension: Secondary | ICD-10-CM | POA: Diagnosis not present

## 2022-11-27 DIAGNOSIS — M479 Spondylosis, unspecified: Secondary | ICD-10-CM | POA: Diagnosis not present

## 2022-11-27 DIAGNOSIS — J948 Other specified pleural conditions: Secondary | ICD-10-CM | POA: Diagnosis not present

## 2022-11-27 DIAGNOSIS — M8008XD Age-related osteoporosis with current pathological fracture, vertebra(e), subsequent encounter for fracture with routine healing: Secondary | ICD-10-CM | POA: Diagnosis not present

## 2022-11-27 DIAGNOSIS — M800AXD Age-related osteoporosis with current pathological fracture, other site, subsequent encounter for fracture with routine healing: Secondary | ICD-10-CM | POA: Diagnosis not present

## 2022-11-27 DIAGNOSIS — W19XXXD Unspecified fall, subsequent encounter: Secondary | ICD-10-CM | POA: Diagnosis not present

## 2022-11-27 DIAGNOSIS — M26643 Arthritis of bilateral temporomandibular joint: Secondary | ICD-10-CM | POA: Diagnosis not present

## 2022-11-27 DIAGNOSIS — F0393 Unspecified dementia, unspecified severity, with mood disturbance: Secondary | ICD-10-CM | POA: Diagnosis not present

## 2022-11-27 DIAGNOSIS — M419 Scoliosis, unspecified: Secondary | ICD-10-CM | POA: Diagnosis not present

## 2022-11-27 DIAGNOSIS — F32A Depression, unspecified: Secondary | ICD-10-CM | POA: Diagnosis not present

## 2022-11-27 DIAGNOSIS — Z8679 Personal history of other diseases of the circulatory system: Secondary | ICD-10-CM | POA: Diagnosis not present

## 2022-11-27 DIAGNOSIS — E7800 Pure hypercholesterolemia, unspecified: Secondary | ICD-10-CM | POA: Diagnosis not present

## 2022-11-27 DIAGNOSIS — Z9181 History of falling: Secondary | ICD-10-CM | POA: Diagnosis not present

## 2022-11-27 DIAGNOSIS — K449 Diaphragmatic hernia without obstruction or gangrene: Secondary | ICD-10-CM | POA: Diagnosis not present

## 2022-11-27 DIAGNOSIS — I492 Junctional premature depolarization: Secondary | ICD-10-CM | POA: Diagnosis not present

## 2022-11-27 DIAGNOSIS — M1909 Primary osteoarthritis, other specified site: Secondary | ICD-10-CM | POA: Diagnosis not present

## 2022-11-27 DIAGNOSIS — I4510 Unspecified right bundle-branch block: Secondary | ICD-10-CM | POA: Diagnosis not present

## 2022-11-27 DIAGNOSIS — E041 Nontoxic single thyroid nodule: Secondary | ICD-10-CM | POA: Diagnosis not present

## 2022-11-27 DIAGNOSIS — I350 Nonrheumatic aortic (valve) stenosis: Secondary | ICD-10-CM | POA: Diagnosis not present

## 2022-11-27 DIAGNOSIS — S2241XA Multiple fractures of ribs, right side, initial encounter for closed fracture: Secondary | ICD-10-CM | POA: Diagnosis not present

## 2022-11-27 DIAGNOSIS — G9389 Other specified disorders of brain: Secondary | ICD-10-CM | POA: Diagnosis not present

## 2022-11-28 ENCOUNTER — Encounter: Payer: Self-pay | Admitting: Family Medicine

## 2022-11-28 ENCOUNTER — Ambulatory Visit (INDEPENDENT_AMBULATORY_CARE_PROVIDER_SITE_OTHER): Payer: Medicare Other | Admitting: Family Medicine

## 2022-11-28 VITALS — BP 130/81 | HR 66 | Temp 97.8°F | Resp 12 | Wt 152.0 lb

## 2022-11-28 DIAGNOSIS — M810 Age-related osteoporosis without current pathological fracture: Secondary | ICD-10-CM

## 2022-11-28 DIAGNOSIS — J948 Other specified pleural conditions: Secondary | ICD-10-CM

## 2022-11-28 DIAGNOSIS — S2241XD Multiple fractures of ribs, right side, subsequent encounter for fracture with routine healing: Secondary | ICD-10-CM | POA: Diagnosis not present

## 2022-11-28 DIAGNOSIS — E041 Nontoxic single thyroid nodule: Secondary | ICD-10-CM

## 2022-11-28 DIAGNOSIS — I10 Essential (primary) hypertension: Secondary | ICD-10-CM

## 2022-11-28 DIAGNOSIS — S2241XA Multiple fractures of ribs, right side, initial encounter for closed fracture: Secondary | ICD-10-CM | POA: Insufficient documentation

## 2022-11-28 DIAGNOSIS — S065XAA Traumatic subdural hemorrhage with loss of consciousness status unknown, initial encounter: Secondary | ICD-10-CM

## 2022-11-28 NOTE — Assessment & Plan Note (Signed)
Noted incidentally on imaging Thyroid US to further characterize Next steps pending results

## 2022-11-28 NOTE — Assessment & Plan Note (Signed)
Upcoming repeat MRI and f/u with NSG scheduled No mental status changes - at baseline

## 2022-11-28 NOTE — Assessment & Plan Note (Signed)
Declines repeat DEXA as she would not take any treatment for it regardless Intolerant to bisphosphonates

## 2022-11-28 NOTE — Assessment & Plan Note (Signed)
Seems to be healing well Continue lidocaine patches prn Discussed likely time for healing/improving

## 2022-11-28 NOTE — Assessment & Plan Note (Signed)
Traumatic 2/2 multiple rib fractures Breathing comfortably Repeat CXR in 1-2 weeks Recommend IS

## 2022-11-28 NOTE — Assessment & Plan Note (Signed)
Well controlled Continue current medications Reviewed recent metabolic panel F/u in 6 months  

## 2022-11-28 NOTE — Progress Notes (Signed)
Established patient visit   Patient: Elaine [name]   DOB: Jul 11, 1941   82 y.o. Female  MRN: [id number] Visit Date: 11/28/2022  Today's healthcare provider: Shirlee Latch, MD   Chief Complaint  Patient presents with   Hospitalization Follow-up   Subjective    HPI   Follow up Hospitalization  Patient was admitted to [facility] on 3/29 and then transferred to [facility] on 3/31 discharged on 11/23/22. She was treated for fall resulting in injuries.  Known injuries: - Displaced right lateral 6th and 7th rib fxs - Nondisplaced right anterior 2nd through 5th anterior rib fxs - Small right hydropneumothorax  - L1 compression fx, age indeterminate   Incidentally noted 42mm thyroid nodule on imaging   Treatment for this included pain management, Cardiopulmonary assessment. Telephone follow up was done on 11/24/22 She reports excellent compliance with treatment. She reports this condition is  improving .  HH PT evaluated her and felt she was doing too well to need continued services Breathing comfortably In pain only with certain movements and coughing/sneezing   Has f/u scheduled with NSG for repeat MRI and f/u ----------------------------------------------------------------------------------------   Medications: Outpatient Medications Prior to Visit  Medication Sig   enalapril (VASOTEC) 5 MG tablet TAKE 1 TABLET (5 MG TOTAL) BY MOUTH DAILY.   ibuprofen (ADVIL) 200 MG tablet Take 200 mg by mouth every 6 (six) hours as needed.   lamoTRIgine (LAMICTAL) 25 MG tablet Take 50 mg by mouth 2 (two) times daily.   sertraline (ZOLOFT) 50 MG tablet TAKE 1 TABLET BY MOUTH EVERY DAY   No facility-administered medications prior to visit.    Review of Systems per HPI     Objective    BP 130/81 (BP Location: Left Arm, Patient Position: Sitting, Cuff Size: Large)   Pulse 66   Temp 97.8 F (36.6 C) (Temporal)   Resp 12   Wt 152 lb (68.9 kg)   SpO2 96%   BMI 26.93  kg/m    Physical Exam Vitals reviewed.  Constitutional:      General: She is not in acute distress.    Appearance: Normal appearance. She is well-developed. She is not diaphoretic.  HENT:     Head: Normocephalic and atraumatic.  Eyes:     General: No scleral icterus.    Conjunctiva/sclera: Conjunctivae normal.  Neck:     Thyroid: No thyromegaly.  Cardiovascular:     Rate and Rhythm: Normal rate and regular rhythm.     Heart sounds: Normal heart sounds. No murmur heard. Pulmonary:     Effort: Pulmonary effort is normal. No respiratory distress.     Breath sounds: Normal breath sounds. No wheezing, rhonchi or rales.  Musculoskeletal:     Cervical back: Neck supple.     Right lower leg: No edema.     Left lower leg: No edema.  Lymphadenopathy:     Cervical: No cervical adenopathy.  Skin:    General: Skin is warm and dry.  Neurological:     Mental Status: She is alert. Mental status is at baseline.  Psychiatric:        Mood and Affect: Mood normal.       No results found for any visits on 11/28/22.  Assessment & Plan     Problem List Items Addressed This Visit       Cardiovascular and Mediastinum   Essential hypertension    Well controlled Continue current medications Reviewed recent metabolic panel F/u in 6 months  Respiratory   Hydropneumothorax    Traumatic 2/2 multiple rib fractures Breathing comfortably Repeat CXR in 1-2 weeks Recommend IS      Relevant Orders   DG Chest 2 View     Endocrine   Thyroid nodule    Noted incidentally on imaging Thyroid US to further characterize Next steps pending results      Relevant Orders   US THYROID     Nervous and Auditory   Subdural hematoma    Upcoming repeat MRI and f/u with NSG scheduled No mental status changes - at baseline        Musculoskeletal and Integument   Osteoporosis, post-menopausal    Declines repeat DEXA as she would not take any treatment for it regardless Intolerant to  bisphosphonates      Multiple closed fractures of ribs of right side - Primary    Seems to be healing well Continue lidocaine patches prn Discussed likely time for healing/improving      Relevant Orders   DG Chest 2 View     Return in about 6 months (around 05/30/2023) for chronic disease f/u.       Jessey Huyett, Marzella Schlein, MD, MPH [facility]

## 2022-12-05 DIAGNOSIS — M47812 Spondylosis without myelopathy or radiculopathy, cervical region: Secondary | ICD-10-CM | POA: Diagnosis not present

## 2022-12-05 DIAGNOSIS — R292 Abnormal reflex: Secondary | ICD-10-CM | POA: Diagnosis not present

## 2022-12-05 DIAGNOSIS — M4856XA Collapsed vertebra, not elsewhere classified, lumbar region, initial encounter for fracture: Secondary | ICD-10-CM | POA: Diagnosis not present

## 2022-12-05 DIAGNOSIS — R29890 Loss of height: Secondary | ICD-10-CM | POA: Diagnosis not present

## 2022-12-08 ENCOUNTER — Ambulatory Visit: Payer: Medicare Other | Attending: Family Medicine

## 2022-12-22 DIAGNOSIS — R292 Abnormal reflex: Secondary | ICD-10-CM | POA: Diagnosis not present

## 2023-03-26 ENCOUNTER — Encounter: Payer: Self-pay | Admitting: Pharmacist

## 2023-03-26 NOTE — Progress Notes (Signed)
   03/26/2023  Patient ID: Elaine [name], female   DOB: Jun 16, 1941, 82 y.o.   MRN: [id number]  Pharmacy Quality Measure Review  This patient is appearing on a report related to adherence measure for hypertension (ACEi/ARB) medications this calendar year.   Medication: enalapril 5 mg Last fill date: 03/24/2023 for 90 day supply  Insurance report not up to date. No action needed at this time.  [name], PharmD, [facility] [phone]

## 2023-05-08 ENCOUNTER — Other Ambulatory Visit: Payer: Self-pay | Admitting: Family Medicine

## 2023-05-08 DIAGNOSIS — F339 Major depressive disorder, recurrent, unspecified: Secondary | ICD-10-CM

## 2023-05-08 NOTE — Telephone Encounter (Signed)
Medication Refill - Medication: sertraline (ZOLOFT) 50 MG tablet  Has the patient contacted their pharmacy? No.  Preferred Pharmacy (with phone number or street name):  [id number] - [place], [place] - [address] Phone: [phone]  Fax: [phone]   Has the patient been seen for an appointment in the last year OR does the patient have an upcoming appointment? Yes.    Agent: Please be advised that RX refills may take up to 3 business days. We ask that you follow-up with your pharmacy.

## 2023-05-10 ENCOUNTER — Ambulatory Visit: Payer: Self-pay | Admitting: *Deleted

## 2023-05-10 NOTE — Telephone Encounter (Signed)
Opened in error; Disregard.

## 2023-05-10 NOTE — Telephone Encounter (Signed)
Requested medication (s) are due for refill today: yes  Requested medication (s) are on the active medication list:yes  Last refill:  last ordered 06/27/22 #90 1 RF  Future visit scheduled: yes  Notes to clinic:  overdue lab work   Requested Prescriptions  Pending Prescriptions Disp Refills   sertraline (ZOLOFT) 50 MG tablet 90 tablet 1    Sig: Take 1 tablet (50 mg total) by mouth daily.     Psychiatry:  Antidepressants - SSRI - sertraline Failed - 05/08/2023  5:32 PM      Failed - AST in normal range and within 360 days    AST  Date Value Ref Range Status  10/08/2019 39 15 - 41 U/L Final         Failed - ALT in normal range and within 360 days    ALT  Date Value Ref Range Status  10/08/2019 19 0 - 44 U/L Final         Passed - Completed PHQ-2 or PHQ-9 in the last 360 days      Passed - Valid encounter within last 6 months    Recent Outpatient Visits           5 months ago Closed fracture of multiple ribs of right side with routine healing, subsequent encounter   [facility] [place], Marzella Schlein, MD   10 months ago Essential hypertension   [place] [facility] [place], Marzella Schlein, MD   1 year ago Essential hypertension   [place] [facility] [place], Marzella Schlein, MD   1 year ago Encounter for annual wellness visit (AWV) in Medicare patient   [facility] [place], Marzella Schlein, MD   2 years ago Generalized anxiety disorder   [place] [facility] Just, Azalee Course, FNP       Future Appointments             In 3 weeks [place], Marzella Schlein, MD [facility]

## 2023-05-11 ENCOUNTER — Telehealth: Payer: Self-pay | Admitting: Family Medicine

## 2023-05-11 DIAGNOSIS — F339 Major depressive disorder, recurrent, unspecified: Secondary | ICD-10-CM

## 2023-05-11 NOTE — Telephone Encounter (Signed)
Already requested by pharmacy on 05/08/23 and routed to the office on 05/10/23 for provider approval.

## 2023-05-11 NOTE — Telephone Encounter (Signed)
Medication Refill - Medication: sertraline (ZOLOFT) 50 MG tablet   Has the patient contacted their pharmacy? Yes.    Preferred Pharmacy (with phone number or street name):  [id number] - [place], [place] - [address] Phone: [phone]  Fax: [phone]     Has the patient been seen for an appointment in the last year OR does the patient have an upcoming appointment? Yes.    Agent: Please be advised that RX refills may take up to 3 business days. We ask that you follow-up with your pharmacy.

## 2023-05-12 MED ORDER — SERTRALINE HCL 50 MG PO TABS: 50.0000 mg | ORAL_TABLET | Freq: Every day | ORAL | 0 refills | Status: DC

## 2023-06-01 ENCOUNTER — Ambulatory Visit: Payer: Medicare Other | Admitting: Family Medicine

## 2023-06-12 ENCOUNTER — Telehealth: Payer: Self-pay

## 2023-06-12 NOTE — Telephone Encounter (Signed)
Unsuccessful attempt to reach patient on all numbers listed in notes for scheduled AWV. Left message on voicemail okay to reschedule.

## 2023-06-18 ENCOUNTER — Other Ambulatory Visit: Payer: Self-pay | Admitting: Family Medicine

## 2023-06-19 NOTE — Telephone Encounter (Signed)
Requested medications are due for refill today.  yes  Requested medications are on the active medications list.  yes  Last refill. 06/27/2022 #90 1 rf  Future visit scheduled.   yes  Notes to clinic.  Expired labs.    Requested Prescriptions  Pending Prescriptions Disp Refills   enalapril (VASOTEC) 5 MG tablet [Pharmacy Med Name: ENALAPRIL MALEATE 5 MG TABLET] 90 tablet 1    Sig: TAKE 1 TABLET (5 MG TOTAL) BY MOUTH DAILY.     Cardiovascular:  ACE Inhibitors Failed - 06/18/2023  1:27 AM      Failed - Cr in normal range and within 180 days    Creatinine, Ser  Date Value Ref Range Status  11/17/2021 0.78 0.57 - 1.00 mg/dL Final         Failed - K in normal range and within 180 days    Potassium  Date Value Ref Range Status  11/17/2021 3.9 3.5 - 5.2 mmol/L Final         Failed - Valid encounter within last 6 months    Recent Outpatient Visits           6 months ago Closed fracture of multiple ribs of right side with routine healing, subsequent encounter   [facility] [place], Marzella Schlein, MD   12 months ago Essential hypertension   [place] [facility] [place], Marzella Schlein, MD   1 year ago Essential hypertension   [place] [facility] [place], Marzella Schlein, MD   1 year ago Encounter for annual wellness visit (AWV) in Medicare patient   [facility] [place], Marzella Schlein, MD   2 years ago Generalized anxiety disorder   [facility] Just, Azalee Course, FNP       Future Appointments             In 2 weeks [place], Marzella Schlein, MD [facility]            Passed - Patient is not pregnant      Passed - Last BP in normal range    BP Readings from Last 1 Encounters:  11/28/22 130/81

## 2023-07-04 ENCOUNTER — Ambulatory Visit: Payer: Medicare Other | Admitting: Family Medicine

## 2023-08-16 ENCOUNTER — Other Ambulatory Visit: Payer: Self-pay | Admitting: Family Medicine

## 2023-08-16 DIAGNOSIS — F339 Major depressive disorder, recurrent, unspecified: Secondary | ICD-10-CM

## 2023-08-31 ENCOUNTER — Other Ambulatory Visit: Payer: Self-pay | Admitting: Family Medicine

## 2023-08-31 DIAGNOSIS — F339 Major depressive disorder, recurrent, unspecified: Secondary | ICD-10-CM

## 2023-10-10 ENCOUNTER — Encounter: Payer: Self-pay | Admitting: Family Medicine

## 2023-12-21 ENCOUNTER — Other Ambulatory Visit: Payer: Self-pay | Admitting: Family Medicine

## 2023-12-24 NOTE — Telephone Encounter (Signed)
 OV needed for additional refills.  Requested Prescriptions  Pending Prescriptions Disp Refills   enalapril  (VASOTEC ) 5 MG tablet [Pharmacy Med Name: ENALAPRIL  MALEATE 5 MG TABLET] 30 tablet 0    Sig: TAKE 1 TABLET (5 MG TOTAL) BY MOUTH DAILY.     Cardiovascular:  ACE Inhibitors Failed - 12/24/2023  9:26 AM      Failed - Cr in normal range and within 180 days    Creatinine, Ser  Date Value Ref Range Status  11/17/2021 0.78 0.57 - 1.00 mg/dL Final         Failed - K in normal range and within 180 days    Potassium  Date Value Ref Range Status  11/17/2021 3.9 3.5 - 5.2 mmol/L Final         Failed - Valid encounter within last 6 months    Recent Outpatient Visits   None            Passed - Patient is not pregnant      Passed - Last BP in normal range    BP Readings from Last 1 Encounters:  11/28/22 130/81

## 2024-02-01 ENCOUNTER — Other Ambulatory Visit: Payer: Self-pay | Admitting: Family Medicine

## 2024-02-01 DIAGNOSIS — F339 Major depressive disorder, recurrent, unspecified: Secondary | ICD-10-CM

## 2024-02-01 NOTE — Telephone Encounter (Signed)
 Copied from CRM [id number]. Topic: Clinical - Medication Refill >> Feb 01, 2024  9:14 AM Alpha Arts wrote: Medication: sertraline  (ZOLOFT ) 50 MG tablet   Has the patient contacted their pharmacy? Yes (Agent: If no, request that the patient contact the pharmacy for the refill. If patient does not wish to contact the pharmacy document the reason why and proceed with request.) (Agent: If yes, when and what did the pharmacy advise?)  This is the patient's preferred pharmacy:  [id number] - [place], [place] - [address] [place] [address] Phone: [phone] Fax: [phone]  Is this the correct pharmacy for this prescription? Yes If no, delete pharmacy and type the correct one.   Has the prescription been filled recently? Yes  Is the patient out of the medication? Yes  Has the patient been seen for an appointment in the last year OR does the patient have an upcoming appointment? Yes  Can we respond through MyChart? Yes  Agent: Please be advised that Rx refills may take up to 3 business days. We ask that you follow-up with your pharmacy.

## 2024-02-02 ENCOUNTER — Other Ambulatory Visit: Payer: Self-pay

## 2024-02-02 DIAGNOSIS — F339 Major depressive disorder, recurrent, unspecified: Secondary | ICD-10-CM

## 2024-02-02 MED ORDER — SERTRALINE HCL 50 MG PO TABS: 50.0000 mg | ORAL_TABLET | Freq: Every day | ORAL | 0 refills | Status: DC

## 2024-02-02 NOTE — Telephone Encounter (Signed)
 Requested medication (s) are due for refill today: yes  Requested medication (s) are on the active medication list: yes  Last refill:  08/17/23 #60   Future visit scheduled: no  Notes to clinic:  noted on previous refill that pt needs office visit before any further refills. Sent pt MyChart message to call and make appointment.   Requested Prescriptions  Pending Prescriptions Disp Refills   sertraline  (ZOLOFT ) 50 MG tablet 60 tablet 0    Sig: Take 1 tablet (50 mg total) by mouth daily.     Psychiatry:  Antidepressants - SSRI - sertraline  Failed - 02/02/2024  3:06 PM      Failed - AST in normal range and within 360 days    AST  Date Value Ref Range Status  10/08/2019 39 15 - 41 U/L Final         Failed - ALT in normal range and within 360 days    ALT  Date Value Ref Range Status  10/08/2019 19 0 - 44 U/L Final         Failed - Completed PHQ-2 or PHQ-9 in the last 360 days      Failed - Valid encounter within last 6 months    Recent Outpatient Visits   None

## 2024-02-05 MED ORDER — SERTRALINE HCL 50 MG PO TABS: 50.0000 mg | ORAL_TABLET | Freq: Every day | ORAL | 5 refills | Status: AC

## 2024-02-13 DIAGNOSIS — S065XAA Traumatic subdural hemorrhage with loss of consciousness status unknown, initial encounter: Secondary | ICD-10-CM | POA: Diagnosis not present

## 2024-05-09 ENCOUNTER — Ambulatory Visit: Payer: Self-pay

## 2024-05-09 NOTE — Telephone Encounter (Signed)
 FYI Only or Action Required?: FYI only for provider.  Patient was last seen in primary care on 11/28/2022 by Myrla Jon HERO, MD.  Called Nurse Triage reporting Altered Mental Status.  Symptoms began several weeks ago.  Interventions attempted: Rest, hydration, or home remedies.  Symptoms are: gradually worsening.  Triage Disposition: See Physician Within 24 Hours  Patient/caregiver understands and will follow disposition?: Yes Reason for Disposition  [1] Confusion getting worse AND [2] slow onset (days to weeks)  Answer Assessment - Initial Assessment Questions Scheduled for next day as unable to bring her in today. Advised to go to UC/ED if symptoms worsen or if she develops abdominal pain, nausea, vomiting, fever.  1. MAIN CONCERN OR SYMPTOM:  What is your main concern right now? What questions do you have? What's the main symptom you're worried about? (e.g., confusion, memory loss)     Acute worsening of memory/mental status. Not eating/drinking well.  2. ONSET:  When did the symptom start (or worsen)? (minutes, hours, days, weeks)     One week  3. BETTER-SAME-WORSE:      Worse  4. DIAGNOSIS: Was the dementia diagnosed by a doctor? If Yes, ask: When? (e.g., days, months, years ago)     Dementia and TBI  5. OTHER SYMPTOMS: Are there any other symptoms? (e.g., cough, falling, fever, pain)     Denies, but states hard to assess. Has not noted complaints regrding  urination, is not incontinent.  Protocols used: Dementia Symptoms and Questions-A-AH Copied from CRM [id number]. Topic: Clinical - Red Word Triage >> May 09, 2024 11:51 AM Charlet HERO wrote: Red Word that prompted transfer to Nurse Triage: Patient is calling to get asst with appt for mother is havng acute mental change extremly confused she has not been drinking anything and may have uti. [place] BFP

## 2024-05-10 ENCOUNTER — Ambulatory Visit (INDEPENDENT_AMBULATORY_CARE_PROVIDER_SITE_OTHER): Admitting: Physician Assistant

## 2024-05-10 ENCOUNTER — Encounter: Payer: Self-pay | Admitting: Physician Assistant

## 2024-05-10 VITALS — BP 149/77 | HR 67 | Temp 97.5°F | Ht 63.0 in | Wt 144.0 lb

## 2024-05-10 DIAGNOSIS — R410 Disorientation, unspecified: Secondary | ICD-10-CM

## 2024-05-10 DIAGNOSIS — R3989 Other symptoms and signs involving the genitourinary system: Secondary | ICD-10-CM

## 2024-05-10 DIAGNOSIS — F03B3 Unspecified dementia, moderate, with mood disturbance: Secondary | ICD-10-CM

## 2024-05-10 LAB — POCT URINALYSIS DIPSTICK
Bilirubin, UA: NEGATIVE
Blood, UA: NEGATIVE
Glucose, UA: NEGATIVE
Ketones, UA: NEGATIVE
Leukocytes, UA: NEGATIVE
Nitrite, UA: NEGATIVE
Protein, UA: NEGATIVE
Spec Grav, UA: 1.01 (ref 1.010–1.025)
Urobilinogen, UA: 0.2 U/dL
pH, UA: 6 (ref 5.0–8.0)

## 2024-05-10 NOTE — Progress Notes (Signed)
 Established patient visit  Patient: Elaine [name]   DOB: 04-06-1941   83 y.o. Female  MRN: [id number] Visit Date: 05/10/2024  Today's healthcare provider: Jolynn Spencer, PA-C   Chief Complaint  Patient presents with   Urinary Tract Infection    Patient has had increasing confusion.  Daughter thought maybe it was her urine but there have been no urinary symptoms.  Son does not think it is anything other than her dementia is progressing.     Subjective      Discussed the use of AI scribe software for clinical note transcription with the patient, who gave verbal consent to proceed.  History of Present Illness Elaine [name] is an 83 year old female with dementia who presents with concerns of a urinary tract infection.  She has no dysuria, increased frequency, or urgency. There is no leg swelling. She attempts to maintain adequate hydration but does not monitor her intake. Her sister has observed increased confusion, and she struggles with memory, such as recalling the current date or the name of the president, though she knows her location. There is no chest pain, shortness of breath, palpitations, or headaches. She occasionally experiences seasonal allergies.       11/28/2022   10:41 AM 06/20/2022    4:18 PM 11/15/2021   10:17 AM  Depression screen PHQ 2/9  Decreased Interest 0 0 0  Down, Depressed, Hopeless 0 1 1  PHQ - 2 Score 0 1 1  Altered sleeping 0 0 1  Tired, decreased energy 1 1 1   Change in appetite 0 0 0  Feeling bad or failure about yourself  0 1 1  Trouble concentrating 0 1 1  Moving slowly or fidgety/restless 0 1 0  Suicidal thoughts 0 0 0  PHQ-9 Score 1 5 5   Difficult doing work/chores Not difficult at all Not difficult at all Not difficult at all      05/08/2019    2:16 PM 01/23/2019   11:12 AM  GAD 7 : Generalized Anxiety Score  Nervous, Anxious, on Edge 1 0  Control/stop worrying 1 1  Worry too much - different things 0 1  Trouble relaxing  0 0  Restless 0 0  Easily annoyed or irritable 0 1  Afraid - awful might happen 1 0  Total GAD 7 Score 3 3  Anxiety Difficulty Not difficult at all Not difficult at all    Medications: Outpatient Medications Prior to Visit  Medication Sig   enalapril  (VASOTEC ) 5 MG tablet TAKE 1 TABLET (5 MG TOTAL) BY MOUTH DAILY.   ibuprofen (ADVIL) 200 MG tablet Take 200 mg by mouth every 6 (six) hours as needed.   lamoTRIgine  (LAMICTAL ) 25 MG tablet Take 50 mg by mouth 2 (two) times daily.   sertraline  (ZOLOFT ) 50 MG tablet Take 1 tablet (50 mg total) by mouth daily. TAKE 1 TABLET BY MOUTH EVERY DAY   No facility-administered medications prior to visit.    Review of Systems All negative Except see HPI       Objective    BP (!) 149/77   Pulse 67   Temp (!) 97.5 F (36.4 C) (Oral)   Ht 5' 3 (1.6 m)   Wt 144 lb (65.3 kg)   SpO2 98%   BMI 25.51 kg/m     Physical Exam Vitals reviewed.  Constitutional:      General: She is not in acute distress.    Appearance: Normal appearance. She is well-developed. She is  not diaphoretic.  HENT:     Head: Normocephalic and atraumatic.  Eyes:     General: No scleral icterus.    Conjunctiva/sclera: Conjunctivae normal.  Neck:     Thyroid : No thyromegaly.  Cardiovascular:     Rate and Rhythm: Normal rate and regular rhythm.     Pulses: Normal pulses.     Heart sounds: Normal heart sounds. No murmur heard. Pulmonary:     Effort: Pulmonary effort is normal. No respiratory distress.     Breath sounds: Normal breath sounds. No wheezing, rhonchi or rales.  Musculoskeletal:     Cervical back: Neck supple.     Right lower leg: No edema.     Left lower leg: No edema.  Lymphadenopathy:     Cervical: No cervical adenopathy.  Skin:    General: Skin is warm and dry.     Findings: No rash.  Neurological:     Mental Status: She is alert and oriented to person, place, and time. Mental status is at baseline.  Psychiatric:        Mood and Affect:  Mood normal.        Behavior: Behavior normal.      Results for orders placed or performed in visit on 05/10/24  POCT urinalysis dipstick  Result Value Ref Range   Color, UA yellow    Clarity, UA clear    Glucose, UA Negative Negative   Bilirubin, UA neg    Ketones, UA neg    Spec Grav, UA 1.010 1.010 - 1.025   Blood, UA neg    pH, UA 6.0 5.0 - 8.0   Protein, UA Negative Negative   Urobilinogen, UA 0.2 0.2 or 1.0 E.U./dL   Nitrite, UA neg    Leukocytes, UA Negative Negative   Appearance     Odor          Assessment & Plan Dementia, unspecified without behavioral disturbance Chronic and worsening Pt was oriented to the place but not to time or to person Confusion and memory issues noted. No behavioral disturbances. Evaluated for possible UTI as a contributing factor. - Order blood work to assess kidney function and rule out contributing factors to confusion. - Obtain urine sample to evaluate for urinary tract infection. - Recommend follow-up with Doctor Maree for ongoing management of dementia.  Confusion and disorientation (Primary) Moderate dementia with mood disturbance, unspecified dementia type (HCC) - Basic metabolic panel with GFR - POCT urinalysis dipstick - Urine Culture - CBC with Differential/Platelet  Suspected UTI  - Basic metabolic panel with GFR - POCT urinalysis dipstick - Urine Culture - CBC with Differential/Platelet   Orders Placed This Encounter  Procedures   Urine Culture   Basic metabolic panel with GFR   CBC with Differential/Platelet   POCT urinalysis dipstick    Return for chronic disease f/u in 4-8 weeks.   The patient was advised to call back or seek an in-person evaluation if the symptoms worsen or if the condition fails to improve as anticipated.  I discussed the assessment and treatment plan with the patient. The patient was provided an opportunity to ask questions and all were answered. The patient agreed with the plan and  demonstrated an understanding of the instructions.  I, Javaughn Opdahl, PA-C have reviewed all documentation for this visit. The documentation on 05/10/2024  for the exam, diagnosis, procedures, and orders are all accurate and complete.  Jolynn Spencer, [facility], MMS [facility] [phone] (phone) [phone] (fax)  [facility]

## 2024-05-11 LAB — CBC WITH DIFFERENTIAL/PLATELET
Basophils Absolute: 0 x10E3/uL (ref 0.0–0.2)
Basos: 1 %
EOS (ABSOLUTE): 0.1 x10E3/uL (ref 0.0–0.4)
Eos: 1 %
Hematocrit: 40.6 % (ref 34.0–46.6)
Hemoglobin: 13.3 g/dL (ref 11.1–15.9)
Immature Grans (Abs): 0 x10E3/uL (ref 0.0–0.1)
Immature Granulocytes: 0 %
Lymphocytes Absolute: 0.8 x10E3/uL (ref 0.7–3.1)
Lymphs: 23 %
MCH: 31.4 pg (ref 26.6–33.0)
MCHC: 32.8 g/dL (ref 31.5–35.7)
MCV: 96 fL (ref 79–97)
Monocytes Absolute: 0.3 x10E3/uL (ref 0.1–0.9)
Monocytes: 8 %
Neutrophils Absolute: 2.4 x10E3/uL (ref 1.4–7.0)
Neutrophils: 67 %
Platelets: 163 x10E3/uL (ref 150–450)
RBC: 4.24 x10E6/uL (ref 3.77–5.28)
RDW: 12.1 % (ref 11.7–15.4)
WBC: 3.5 x10E3/uL (ref 3.4–10.8)

## 2024-05-11 LAB — BASIC METABOLIC PANEL WITH GFR
BUN/Creatinine Ratio: 21 (ref 12–28)
BUN: 17 mg/dL (ref 8–27)
CO2: 24 mmol/L (ref 20–29)
Calcium: 8.9 mg/dL (ref 8.7–10.3)
Chloride: 104 mmol/L (ref 96–106)
Creatinine, Ser: 0.82 mg/dL (ref 0.57–1.00)
Glucose: 93 mg/dL (ref 70–99)
Potassium: 3.6 mmol/L (ref 3.5–5.2)
Sodium: 142 mmol/L (ref 134–144)
eGFR: 70 mL/min/1.73 (ref >59)

## 2024-05-12 ENCOUNTER — Ambulatory Visit: Payer: Self-pay | Admitting: Physician Assistant

## 2024-05-12 LAB — SPECIMEN STATUS REPORT

## 2024-05-12 LAB — URINE CULTURE

## 2024-05-30 DIAGNOSIS — S065XAA Traumatic subdural hemorrhage with loss of consciousness status unknown, initial encounter: Secondary | ICD-10-CM | POA: Diagnosis not present

## 2024-05-31 DIAGNOSIS — H903 Sensorineural hearing loss, bilateral: Secondary | ICD-10-CM | POA: Diagnosis not present

## 2024-05-31 DIAGNOSIS — H6123 Impacted cerumen, bilateral: Secondary | ICD-10-CM | POA: Diagnosis not present
